# Patient Record
Sex: Female | Born: 1985 | Race: Black or African American | Hispanic: No | Marital: Single | Smoking: Never smoker
Health system: Southern US, Community
[De-identification: ages and names within clinical notes are randomized; demographics above are authoritative.]

## PROBLEM LIST (undated history)

## (undated) ENCOUNTER — Inpatient Hospital Stay (HOSPITAL_COMMUNITY): Payer: Self-pay

## (undated) DIAGNOSIS — N390 Urinary tract infection, site not specified: Secondary | ICD-10-CM

## (undated) DIAGNOSIS — I10 Essential (primary) hypertension: Secondary | ICD-10-CM

## (undated) DIAGNOSIS — K219 Gastro-esophageal reflux disease without esophagitis: Secondary | ICD-10-CM

## (undated) DIAGNOSIS — K589 Irritable bowel syndrome without diarrhea: Secondary | ICD-10-CM

## (undated) HISTORY — PX: WISDOM TOOTH EXTRACTION: SHX21

## (undated) HISTORY — DX: Gastro-esophageal reflux disease without esophagitis: K21.9

---
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
## 2007-05-06 ENCOUNTER — Emergency Department (HOSPITAL_COMMUNITY): Admission: EM | Admit: 2007-05-06 | Discharge: 2007-05-06 | Payer: Self-pay | Admitting: Emergency Medicine

## 2007-06-12 ENCOUNTER — Emergency Department (HOSPITAL_COMMUNITY): Admission: EM | Admit: 2007-06-12 | Discharge: 2007-06-12 | Payer: Self-pay | Admitting: Family Medicine

## 2007-07-14 DIAGNOSIS — R87610 Atypical squamous cells of undetermined significance on cytologic smear of cervix (ASC-US): Secondary | ICD-10-CM | POA: Insufficient documentation

## 2007-07-16 ENCOUNTER — Ambulatory Visit (HOSPITAL_COMMUNITY): Admission: RE | Admit: 2007-07-16 | Discharge: 2007-07-16 | Payer: Self-pay | Admitting: Family Medicine

## 2007-08-09 ENCOUNTER — Ambulatory Visit (HOSPITAL_COMMUNITY): Admission: RE | Admit: 2007-08-09 | Discharge: 2007-08-09 | Payer: Self-pay | Admitting: Family Medicine

## 2007-08-27 ENCOUNTER — Inpatient Hospital Stay (HOSPITAL_COMMUNITY): Admission: AD | Admit: 2007-08-27 | Discharge: 2007-08-27 | Payer: Self-pay | Admitting: Obstetrics & Gynecology

## 2007-09-08 ENCOUNTER — Ambulatory Visit (HOSPITAL_COMMUNITY): Admission: RE | Admit: 2007-09-08 | Discharge: 2007-09-08 | Payer: Self-pay | Admitting: Obstetrics & Gynecology

## 2007-09-17 ENCOUNTER — Ambulatory Visit (HOSPITAL_COMMUNITY): Admission: RE | Admit: 2007-09-17 | Discharge: 2007-09-17 | Payer: Self-pay | Admitting: Obstetrics & Gynecology

## 2007-12-04 ENCOUNTER — Inpatient Hospital Stay (HOSPITAL_COMMUNITY): Admission: AD | Admit: 2007-12-04 | Discharge: 2007-12-05 | Payer: Self-pay | Admitting: Obstetrics and Gynecology

## 2007-12-04 ENCOUNTER — Ambulatory Visit: Payer: Self-pay | Admitting: *Deleted

## 2007-12-21 ENCOUNTER — Inpatient Hospital Stay (HOSPITAL_COMMUNITY): Admission: AD | Admit: 2007-12-21 | Discharge: 2007-12-21 | Payer: Self-pay | Admitting: Obstetrics & Gynecology

## 2007-12-21 ENCOUNTER — Ambulatory Visit: Payer: Self-pay | Admitting: *Deleted

## 2008-01-25 ENCOUNTER — Inpatient Hospital Stay (HOSPITAL_COMMUNITY): Admission: AD | Admit: 2008-01-25 | Discharge: 2008-01-28 | Payer: Self-pay | Admitting: Gynecology

## 2008-01-25 ENCOUNTER — Ambulatory Visit: Payer: Self-pay | Admitting: Family

## 2008-03-09 ENCOUNTER — Encounter (INDEPENDENT_AMBULATORY_CARE_PROVIDER_SITE_OTHER): Payer: Self-pay | Admitting: Nurse Practitioner

## 2008-03-09 LAB — CONVERTED CEMR LAB
HCT: 39.6 %
Hemoglobin: 13.3 g/dL

## 2008-03-10 ENCOUNTER — Encounter (INDEPENDENT_AMBULATORY_CARE_PROVIDER_SITE_OTHER): Payer: Self-pay | Admitting: Nurse Practitioner

## 2008-03-10 LAB — CONVERTED CEMR LAB
Chlamydia, DNA Probe: NEGATIVE
GC Probe Amp, Genital: NEGATIVE
RPR Ser Ql: NONREACTIVE

## 2008-03-13 DIAGNOSIS — I10 Essential (primary) hypertension: Secondary | ICD-10-CM | POA: Insufficient documentation

## 2008-03-13 LAB — CONVERTED CEMR LAB: Pap Smear: NEGATIVE

## 2008-04-01 ENCOUNTER — Emergency Department (HOSPITAL_COMMUNITY): Admission: EM | Admit: 2008-04-01 | Discharge: 2008-04-01 | Payer: Self-pay | Admitting: Family Medicine

## 2008-04-13 ENCOUNTER — Ambulatory Visit: Payer: Self-pay | Admitting: Nurse Practitioner

## 2008-04-13 DIAGNOSIS — S86819A Strain of other muscle(s) and tendon(s) at lower leg level, unspecified leg, initial encounter: Secondary | ICD-10-CM

## 2008-04-13 DIAGNOSIS — S838X9A Sprain of other specified parts of unspecified knee, initial encounter: Secondary | ICD-10-CM | POA: Insufficient documentation

## 2008-04-19 ENCOUNTER — Encounter (INDEPENDENT_AMBULATORY_CARE_PROVIDER_SITE_OTHER): Payer: Self-pay | Admitting: Nurse Practitioner

## 2008-04-26 ENCOUNTER — Encounter (INDEPENDENT_AMBULATORY_CARE_PROVIDER_SITE_OTHER): Payer: Self-pay | Admitting: *Deleted

## 2008-04-26 ENCOUNTER — Ambulatory Visit: Payer: Self-pay | Admitting: Nurse Practitioner

## 2008-05-18 ENCOUNTER — Encounter (INDEPENDENT_AMBULATORY_CARE_PROVIDER_SITE_OTHER): Payer: Self-pay | Admitting: Nurse Practitioner

## 2008-05-19 ENCOUNTER — Encounter (INDEPENDENT_AMBULATORY_CARE_PROVIDER_SITE_OTHER): Payer: Self-pay | Admitting: Nurse Practitioner

## 2008-07-07 DIAGNOSIS — S0083XA Contusion of other part of head, initial encounter: Secondary | ICD-10-CM

## 2008-07-07 DIAGNOSIS — S0003XA Contusion of scalp, initial encounter: Secondary | ICD-10-CM | POA: Insufficient documentation

## 2008-07-07 DIAGNOSIS — S1093XA Contusion of unspecified part of neck, initial encounter: Secondary | ICD-10-CM

## 2008-07-11 ENCOUNTER — Ambulatory Visit: Payer: Self-pay | Admitting: Internal Medicine

## 2009-04-30 ENCOUNTER — Ambulatory Visit: Payer: Self-pay | Admitting: Nurse Practitioner

## 2009-04-30 DIAGNOSIS — R002 Palpitations: Secondary | ICD-10-CM | POA: Insufficient documentation

## 2009-04-30 LAB — CONVERTED CEMR LAB
ALT: <8 units/L (ref 0–35)
AST: 10 units/L (ref 0–37)
Albumin: 4.5 g/dL (ref 3.5–5.2)
Alkaline Phosphatase: 83 units/L (ref 39–117)
BUN: 9 mg/dL (ref 6–23)
Basophils Absolute: 0 10*3/uL (ref 0.0–0.1)
Basophils Relative: 0 % (ref 0–1)
Bilirubin Urine: NEGATIVE
Blood in Urine, dipstick: NEGATIVE
CO2: 26 meq/L (ref 19–32)
Calcium: 9.4 mg/dL (ref 8.4–10.5)
Chloride: 100 meq/L (ref 96–112)
Creatinine, Ser: 0.88 mg/dL (ref 0.40–1.20)
Creatinine, Urine: 316.5 mg/dL
Eosinophils Absolute: 0.1 10*3/uL (ref 0.0–0.7)
Eosinophils Relative: 1 % (ref 0–5)
Glucose, Bld: 88 mg/dL (ref 70–99)
Glucose, Urine, Semiquant: NEGATIVE
HCT: 40.1 % (ref 36.0–46.0)
Hemoglobin: 13.2 g/dL (ref 12.0–15.0)
Lymphocytes Relative: 37 % (ref 12–46)
Lymphs Abs: 2.2 10*3/uL (ref 0.7–4.0)
MCHC: 32.9 g/dL (ref 30.0–36.0)
MCV: 89.3 fL (ref 78.0–100.0)
Microalb Creat Ratio: 3.2 mg/g (ref 0.0–30.0)
Microalb, Ur: 1.02 mg/dL (ref 0.00–1.89)
Monocytes Absolute: 0.6 10*3/uL (ref 0.1–1.0)
Monocytes Relative: 10 % (ref 3–12)
Neutro Abs: 3.1 10*3/uL (ref 1.7–7.7)
Neutrophils Relative %: 52 % (ref 43–77)
Nitrite: NEGATIVE
Platelets: 450 10*3/uL — ABNORMAL HIGH (ref 150–400)
Potassium: 3.9 meq/L (ref 3.5–5.3)
RBC: 4.49 M/uL (ref 3.87–5.11)
RDW: 13.9 % (ref 11.5–15.5)
Sodium: 137 meq/L (ref 135–145)
Specific Gravity, Urine: 1.025
TSH: 1.416 microintl units/mL (ref 0.350–4.500)
Total Bilirubin: 0.4 mg/dL (ref 0.3–1.2)
Total Protein: 7.9 g/dL (ref 6.0–8.3)
Urobilinogen, UA: 0.2
WBC: 6 10*3/uL (ref 4.0–10.5)
pH: 5.5

## 2009-05-01 ENCOUNTER — Encounter (INDEPENDENT_AMBULATORY_CARE_PROVIDER_SITE_OTHER): Payer: Self-pay | Admitting: Nurse Practitioner

## 2009-08-03 ENCOUNTER — Ambulatory Visit: Payer: Self-pay | Admitting: Nurse Practitioner

## 2009-08-03 LAB — CONVERTED CEMR LAB
Bilirubin Urine: NEGATIVE
Glucose, Urine, Semiquant: NEGATIVE
Ketones, urine, test strip: NEGATIVE
Nitrite: NEGATIVE
Protein, U semiquant: NEGATIVE
Specific Gravity, Urine: 1.015
Urobilinogen, UA: 0.2
pH: 5.5

## 2010-02-05 ENCOUNTER — Emergency Department (HOSPITAL_COMMUNITY): Admission: EM | Admit: 2010-02-05 | Discharge: 2010-02-05 | Payer: Self-pay | Admitting: Family Medicine

## 2010-04-30 ENCOUNTER — Telehealth (INDEPENDENT_AMBULATORY_CARE_PROVIDER_SITE_OTHER): Payer: Self-pay | Admitting: Nurse Practitioner

## 2010-04-30 ENCOUNTER — Ambulatory Visit: Payer: Self-pay | Admitting: Nurse Practitioner

## 2010-04-30 DIAGNOSIS — N390 Urinary tract infection, site not specified: Secondary | ICD-10-CM | POA: Insufficient documentation

## 2010-04-30 LAB — CONVERTED CEMR LAB
Bilirubin Urine: NEGATIVE
Glucose, Urine, Semiquant: NEGATIVE
KOH Prep: NEGATIVE
Ketones, urine, test strip: NEGATIVE
Nitrite: NEGATIVE
Protein, U semiquant: 30
Specific Gravity, Urine: >=1.03
Urobilinogen, UA: 0.2
Whiff Test: NEGATIVE
pH: 6

## 2010-05-01 ENCOUNTER — Encounter (INDEPENDENT_AMBULATORY_CARE_PROVIDER_SITE_OTHER): Payer: Self-pay | Admitting: Internal Medicine

## 2010-05-15 ENCOUNTER — Telehealth (INDEPENDENT_AMBULATORY_CARE_PROVIDER_SITE_OTHER): Payer: Self-pay | Admitting: *Deleted

## 2010-05-18 ENCOUNTER — Emergency Department (HOSPITAL_COMMUNITY): Admission: EM | Admit: 2010-05-18 | Discharge: 2010-05-19 | Payer: Self-pay | Admitting: Emergency Medicine

## 2010-06-03 ENCOUNTER — Ambulatory Visit: Payer: Self-pay | Admitting: Physician Assistant

## 2010-06-03 DIAGNOSIS — K5289 Other specified noninfective gastroenteritis and colitis: Secondary | ICD-10-CM | POA: Insufficient documentation

## 2010-07-01 ENCOUNTER — Ambulatory Visit: Payer: Self-pay | Admitting: Nurse Practitioner

## 2010-07-01 DIAGNOSIS — N939 Abnormal uterine and vaginal bleeding, unspecified: Secondary | ICD-10-CM

## 2010-07-01 DIAGNOSIS — N926 Irregular menstruation, unspecified: Secondary | ICD-10-CM | POA: Insufficient documentation

## 2010-07-01 LAB — CONVERTED CEMR LAB
Bilirubin Urine: NEGATIVE
Blood in Urine, dipstick: NEGATIVE
Glucose, Urine, Semiquant: NEGATIVE
Nitrite: NEGATIVE
Protein, U semiquant: NEGATIVE
Rapid HIV Screen: NEGATIVE
Specific Gravity, Urine: >=1.03
Urobilinogen, UA: 0.2
pH: 5.5

## 2010-07-02 ENCOUNTER — Telehealth (INDEPENDENT_AMBULATORY_CARE_PROVIDER_SITE_OTHER): Payer: Self-pay | Admitting: Nurse Practitioner

## 2010-07-03 ENCOUNTER — Encounter (INDEPENDENT_AMBULATORY_CARE_PROVIDER_SITE_OTHER): Payer: Self-pay | Admitting: Nurse Practitioner

## 2010-07-03 LAB — CONVERTED CEMR LAB
ALT: <8 units/L (ref 0–35)
AST: 16 units/L (ref 0–37)
Albumin: 4.2 g/dL (ref 3.5–5.2)
Alkaline Phosphatase: 81 units/L (ref 39–117)
BUN: 13 mg/dL (ref 6–23)
Basophils Absolute: 0 10*3/uL (ref 0.0–0.1)
Basophils Relative: 1 % (ref 0–1)
CO2: 25 meq/L (ref 19–32)
Calcium: 9.5 mg/dL (ref 8.4–10.5)
Chloride: 108 meq/L (ref 96–112)
Creatinine, Ser: 0.92 mg/dL (ref 0.40–1.20)
Eosinophils Absolute: 0.1 10*3/uL (ref 0.0–0.7)
Eosinophils Relative: 1 % (ref 0–5)
Glucose, Bld: 86 mg/dL (ref 70–99)
HCT: 38 % (ref 36.0–46.0)
Hemoglobin: 12.9 g/dL (ref 12.0–15.0)
Lymphocytes Relative: 31 % (ref 12–46)
Lymphs Abs: 1.5 10*3/uL (ref 0.7–4.0)
MCHC: 33.9 g/dL (ref 30.0–36.0)
MCV: 90 fL (ref 78.0–100.0)
Monocytes Absolute: 0.9 10*3/uL (ref 0.1–1.0)
Monocytes Relative: 17 % — ABNORMAL HIGH (ref 3–12)
Neutro Abs: 2.5 10*3/uL (ref 1.7–7.7)
Neutrophils Relative %: 50 % (ref 43–77)
Platelets: 327 10*3/uL (ref 150–400)
Potassium: 4.5 meq/L (ref 3.5–5.3)
Preg, Serum: POSITIVE
RBC: 4.22 M/uL (ref 3.87–5.11)
RDW: 13.6 % (ref 11.5–15.5)
Sodium: 141 meq/L (ref 135–145)
TSH: 1.433 microintl units/mL (ref 0.350–4.500)
Total Bilirubin: 0.3 mg/dL (ref 0.3–1.2)
Total Protein: 7.1 g/dL (ref 6.0–8.3)
WBC: 4.9 10*3/uL (ref 4.0–10.5)

## 2010-07-04 ENCOUNTER — Encounter (INDEPENDENT_AMBULATORY_CARE_PROVIDER_SITE_OTHER): Payer: Self-pay | Admitting: Nurse Practitioner

## 2010-07-05 ENCOUNTER — Encounter (INDEPENDENT_AMBULATORY_CARE_PROVIDER_SITE_OTHER): Payer: Self-pay | Admitting: *Deleted

## 2010-07-05 ENCOUNTER — Ambulatory Visit: Payer: Self-pay | Admitting: Nurse Practitioner

## 2010-07-05 LAB — CONVERTED CEMR LAB: hCG, Beta Chain, Quant, S: 292.4 milliintl units/mL

## 2010-07-06 ENCOUNTER — Inpatient Hospital Stay (HOSPITAL_COMMUNITY): Admission: AD | Admit: 2010-07-06 | Discharge: 2010-07-06 | Payer: Self-pay | Admitting: Obstetrics & Gynecology

## 2010-07-06 ENCOUNTER — Ambulatory Visit: Payer: Self-pay | Admitting: Obstetrics and Gynecology

## 2010-07-08 ENCOUNTER — Ambulatory Visit: Payer: Self-pay | Admitting: Obstetrics and Gynecology

## 2010-07-08 ENCOUNTER — Inpatient Hospital Stay (HOSPITAL_COMMUNITY): Admission: AD | Admit: 2010-07-08 | Discharge: 2010-07-08 | Payer: Self-pay | Admitting: Obstetrics and Gynecology

## 2010-07-15 ENCOUNTER — Ambulatory Visit: Payer: Self-pay | Admitting: Nurse Practitioner

## 2010-07-15 ENCOUNTER — Ambulatory Visit (HOSPITAL_COMMUNITY): Admission: RE | Admit: 2010-07-15 | Discharge: 2010-07-15 | Payer: Self-pay | Admitting: Obstetrics and Gynecology

## 2010-09-03 ENCOUNTER — Encounter: Payer: Self-pay | Admitting: Family Medicine

## 2010-09-03 ENCOUNTER — Ambulatory Visit (HOSPITAL_COMMUNITY)
Admission: RE | Admit: 2010-09-03 | Discharge: 2010-09-03 | Payer: Self-pay | Source: Home / Self Care | Attending: Family Medicine | Admitting: Family Medicine

## 2010-09-05 ENCOUNTER — Ambulatory Visit: Payer: Self-pay | Admitting: Obstetrics & Gynecology

## 2010-09-05 ENCOUNTER — Encounter: Payer: Self-pay | Admitting: Physician Assistant

## 2010-09-19 ENCOUNTER — Ambulatory Visit: Payer: Self-pay | Admitting: Obstetrics & Gynecology

## 2010-09-20 ENCOUNTER — Inpatient Hospital Stay (HOSPITAL_COMMUNITY)
Admission: AD | Admit: 2010-09-20 | Discharge: 2010-09-20 | Payer: Self-pay | Source: Home / Self Care | Attending: Obstetrics & Gynecology | Admitting: Obstetrics & Gynecology

## 2010-09-23 ENCOUNTER — Ambulatory Visit
Admission: RE | Admit: 2010-09-23 | Discharge: 2010-09-23 | Payer: Self-pay | Source: Home / Self Care | Attending: Family Medicine | Admitting: Family Medicine

## 2010-09-24 ENCOUNTER — Encounter: Payer: Self-pay | Admitting: Family Medicine

## 2010-09-24 LAB — CONVERTED CEMR LAB
ALT: <8 units/L (ref 0–35)
Albumin: 3.9 g/dL (ref 3.5–5.2)
CO2: 22 meq/L (ref 19–32)
Calcium: 9.8 mg/dL (ref 8.4–10.5)
Chloride: 105 meq/L (ref 96–112)
Collection Interval-CRCL: 24 hr
Creatinine Clearance: 162 mL/min — ABNORMAL HIGH (ref 75–115)
Glucose, Bld: 79 mg/dL (ref 70–99)
Potassium: 3.9 meq/L (ref 3.5–5.3)
Sodium: 136 meq/L (ref 135–145)
TSH: 1.135 microintl units/mL (ref 0.350–4.500)
Total Bilirubin: 0.3 mg/dL (ref 0.3–1.2)
Total Protein: 7.1 g/dL (ref 6.0–8.3)

## 2010-09-26 ENCOUNTER — Ambulatory Visit (HOSPITAL_COMMUNITY): Admission: RE | Admit: 2010-09-26 | Payer: Self-pay | Source: Home / Self Care | Admitting: Family Medicine

## 2010-09-26 ENCOUNTER — Ambulatory Visit
Admission: RE | Admit: 2010-09-26 | Discharge: 2010-09-26 | Payer: Self-pay | Source: Home / Self Care | Attending: Obstetrics & Gynecology | Admitting: Obstetrics & Gynecology

## 2010-09-26 LAB — POCT URINALYSIS DIPSTICK
Hemoglobin, Urine: NEGATIVE
Ketones, ur: NEGATIVE mg/dL
Nitrite: NEGATIVE
Protein, ur: NEGATIVE mg/dL
Specific Gravity, Urine: >=1.03 (ref 1.005–1.030)
Urine Glucose, Fasting: NEGATIVE mg/dL
Urobilinogen, UA: 1 mg/dL (ref 0.0–1.0)
pH: 6 (ref 5.0–8.0)

## 2010-10-04 ENCOUNTER — Ambulatory Visit
Admission: RE | Admit: 2010-10-04 | Discharge: 2010-10-04 | Payer: Self-pay | Source: Home / Self Care | Attending: Obstetrics & Gynecology | Admitting: Obstetrics & Gynecology

## 2010-10-10 ENCOUNTER — Ambulatory Visit
Admission: RE | Admit: 2010-10-10 | Discharge: 2010-10-10 | Payer: Self-pay | Source: Home / Self Care | Attending: Obstetrics & Gynecology | Admitting: Obstetrics & Gynecology

## 2010-10-10 ENCOUNTER — Ambulatory Visit: Admit: 2010-10-10 | Payer: Self-pay | Admitting: Family Medicine

## 2010-10-16 ENCOUNTER — Other Ambulatory Visit (HOSPITAL_COMMUNITY): Payer: Self-pay | Admitting: Maternal and Fetal Medicine

## 2010-10-16 ENCOUNTER — Encounter: Payer: Self-pay | Admitting: Family Medicine

## 2010-10-16 ENCOUNTER — Ambulatory Visit (HOSPITAL_COMMUNITY)
Admission: RE | Admit: 2010-10-16 | Discharge: 2010-10-16 | Payer: Self-pay | Source: Home / Self Care | Attending: Family Medicine | Admitting: Family Medicine

## 2010-10-16 DIAGNOSIS — O10919 Unspecified pre-existing hypertension complicating pregnancy, unspecified trimester: Secondary | ICD-10-CM

## 2010-10-17 ENCOUNTER — Ambulatory Visit: Admit: 2010-10-17 | Payer: Self-pay | Admitting: Family Medicine

## 2010-10-18 ENCOUNTER — Ambulatory Visit
Admission: RE | Admit: 2010-10-18 | Discharge: 2010-10-18 | Payer: Self-pay | Source: Home / Self Care | Attending: Obstetrics and Gynecology | Admitting: Obstetrics and Gynecology

## 2010-10-20 LAB — CONVERTED CEMR LAB: hCG, Beta Chain, Quant, S: 25.3 milliintl units/mL

## 2010-10-22 NOTE — Progress Notes (Signed)
Summary: pt is calling to get test result  Phone Note Call from Patient Call back at [phone]   Caller: Patient Summary of Call: PT IS CALLING TO GET HER TEST RESULTS Initial call taken by: Domenic Polite,  July 02, 2010 3:12 PM  Follow-up for Phone Call        I called pt and gave her the lab results I would like her to come see me this week to discuss her options Steward Drone - call pt; offer her an appt on Friday. Get with me to see where we can fit her into the schedule but want to see her before the end of the week **Add Quantitiative HCG to blood in lab**  PT IS RETURNING YOUR CALL [phone](MARIA CASTILLO) Follow-up by: Lehman Prom FNP,  July 02, 2010 5:59 PM  Additional Follow-up for Phone Call Additional follow up Details #1::        pt is informed and will come in for ov on 10/14 Additional Follow-up by: Levon Hedger,  July 03, 2010 4:03 PM

## 2010-10-22 NOTE — Assessment & Plan Note (Signed)
Summary: Possible UTI // tl  Nurse Visit   Vital Signs:  Patient profile:   25 year old female Menstrual status:  regular Temp:     98.1 degrees F Pulse rate:   68 / minute Pulse rhythm:   regular Resp:     20 per minute BP sitting:   130 / 96  (right arm)  Vitals Entered By: Dutch Quint RN (April 30, 2010 2:09 PM)  Patient Instructions: 1)  To return in 2 weeks for BP check with Dutch Quint, RN. 2)  No concern if bp less than 140/90 \  Review of Systems GU:  Complains of dysuria, nocturia, and urinary frequency; denies hematuria and incontinence.   History of Present Illness: Pt. denies vaginal discharge, itching, odor. NKDA Uses condoms regularly   Impression & Recommendations:  Problem # 1:  DYSURIA (ICD-788.1)  Orders: T-Culture, Urine ([address]109-60454) KOH/ WET Mount [phone]) UA Dipstick w/o Micro (automated)  (81003)  Her updated medication list for this problem includes:    Bactrim Ds 800-160 Mg Tabs (Sulfamethoxazole-trimethoprim) .Marland Kitchen... 1 tab by mouth two times a day for 3 days.  Complete Medication List: 1)  Hydrochlorothiazide 25 Mg Tabs (Hydrochlorothiazide) .... Take 1 tab by mouth every morning 2)  Bactrim Ds 800-160 Mg Tabs (Sulfamethoxazole-trimethoprim) .Marland Kitchen.. 1 tab by mouth two times a day for 3 days.   Allergies: No Known Drug Allergies Laboratory Results   Urine Tests  Date/Time Received: April 30, 2010  2:00 PM  Routine Urinalysis   Color: lt. yellow Glucose: negative   (Normal Range: Negative) Bilirubin: negative   (Normal Range: Negative) Ketone: negative   (Normal Range: Negative) Spec. Gravity: >=1.030   (Normal Range: 1.003-1.035) Blood: trace-lysed   (Normal Range: Negative) pH: 6.0   (Normal Range: 5.0-8.0) Protein: 30   (Normal Range: Negative) Urobilinogen: 0.2   (Normal Range: 0-1) Nitrite: negative   (Normal Range: Negative) Leukocyte Esterace: small   (Normal Range: Negative)      Wet Mount Source: self  swab WBC/hpf: 1-5 Bacteria/hpf: 2+ Clue cells/hpf: few  Negative whiff Yeast/hpf: none Wet Mount KOH: Negative Trichomonas/hpf: none   Orders Added: 1)  T-Culture, Urine [[id number]] 2)  KOH/ WET Mount [87210] 3)  UA Dipstick w/o Micro (automated)  [81003] 4)  Est. Patient Level II [13086] Prescriptions: BACTRIM DS 800-160 MG TABS (SULFAMETHOXAZOLE-TRIMETHOPRIM) 1 tab by mouth two times a day for 3 days.  #6 x 0   Entered by:   Dutch Quint RN   Authorized by:   Lehman Prom FNP   Signed by:   Dutch Quint RN on 04/30/2010   Method used:   Electronically to        [facility] (retail)       [address]       [place], [place]  [id number]       Ph: [phone]       Fax: [phone]   RxID:   [id number]

## 2010-10-22 NOTE — Assessment & Plan Note (Signed)
Summary: Abnormal vaginal bleeding   Vital Signs:  Patient profile:   25 year old female Menstrual status:  regular Weight:      160.3 pounds Temp:     100.3 degrees F oral Pulse rate:   100 / minute Pulse rhythm:   regular Resp:     16 per minute BP sitting:   120 / 86  (right arm) Cuff size:   regular  Vitals Entered By: Michelle Nasuti (July 05, 2010 2:00 PM) CC: review lab results   Primary Care Provider:  Lehman Prom FNP  CC:  review lab results.  History of Present Illness:  Pt into the office for f/u on labs. Labs done earlier this week showed a positive serum qualititaive results. She reports that she has had some scant serous discharge since that time. Denies any vaginal cramping  Sister and best friend present here today.  Pt already has 2 children and she reports that she has a strong support system in place. Reports that the baby's father is involved.   Allergies: No Known Drug Allergies  Review of Systems CV:  Denies chest pain or discomfort. Resp:  Denies cough. GI:  Denies abdominal pain, nausea, and vomiting. GU:  Complains of abnormal vaginal bleeding; spotting since her last visit here.  Physical Exam  General:  alert.   Head:  normocephalic.   Msk:  normal ROM.   Neurologic:  alert & oriented X3.   Skin:  color normal.   Psych:  Oriented X3.     Impression & Recommendations:  Problem # 1:  ABNORMAL VAGINAL BLEEDING (ICD-626.9) still with vaginal spotting during the week. will check quants STAT and inform pt of the results low grade fever today ?concern for spontaneous miscarrage Orders: T-Pregnancy (Serum), Quant. [phone])  Problem # 2:  PREGNANCY EXAMINATION OR TEST POSITIVE RESULT (ICD-V72.42) positive serum test earlier this week will check quants  Complete Medication List: 1)  Hydrochlorothiazide 25 Mg Tabs (Hydrochlorothiazide) .... Take 1 tab by mouth every morning 2)  Prenatal/folic Acid Tabs (Prenatal vit-fe  fumarate-fa) .... One tablet by mouth daily  Patient Instructions: 1)  You will be notified of the results this afternoon. 2)  Start prenatal vitamin Prescriptions: PRENATAL/FOLIC ACID  TABS (PRENATAL VIT-FE FUMARATE-FA) One tablet by mouth daily  #30 x 11   Entered and Authorized by:   Lehman Prom FNP   Signed by:   Lehman Prom FNP on 07/05/2010   Method used:   Print then Give to Patient   RxID:   [id number]

## 2010-10-22 NOTE — Medication Information (Signed)
Summary: MAP PROGRAM  MAP PROGRAM   Imported By: Arta Bruce 07/04/2010 12:18:56  _____________________________________________________________________  External Attachment:    Type:   Image     Comment:   External Document

## 2010-10-22 NOTE — Assessment & Plan Note (Signed)
Summary: Complete Physical Exam   Vital Signs:  Patient profile:   25 year old female Menstrual status:  regular LMP:     06/08/2010 Weight:      160.4 pounds BMI:     24.84 Temp:     98.8 degrees F oral Pulse rate:   72 / minute Pulse rhythm:   regular Resp:     20 per minute BP sitting:   110 / 80  (left arm) Cuff size:   large  Vitals Entered By: Levon Hedger (July 01, 2010 3:16 PM) CC: CPP Is Patient Diabetic? No Pain Assessment Patient in pain? no       Does patient need assistance? Functional Status Self care Ambulation Normal LMP (date): 06/08/2010 LMP - Character: normal    Menses interval (days): 28 Menstrual flow (days): 3 On BCP's at conception: no Menstrual Status regular Enter LMP: 06/08/2010 Last PAP Result negative per pt done at [facility] health   Primary Care Provider:  Lehman Prom FNP  CC:  CPP.  History of Present Illness:  Pt into the office for a complete physical exam  PAP - last done 1 year ago.  All previous PAP smears normal. No family history of cervical or ovarian cancer Children: ages 62 and 2 Menses - monthly. No current birth control. Pt admits that she is sexually active.  Mammogram - never had mammogram. no family history of breast cancer  Optho - wears glasses. last eye exam was June 2011.  Dental - Last dental exam was in May 2011  tdap - up to date    Habits & Providers  Alcohol-Tobacco-Diet     Alcohol drinks/day: 0     Tobacco Status: quit     Tobacco Counseling: to remain off tobacco products     Cigarette Packs/Day: 0.25     Year Quit: 2011  Exercise-Depression-Behavior     Does Patient Exercise: no     Have you felt down or hopeless? no     Have you felt little pleasure in things? no     Depression Counseling: not indicated; screening negative for depression     Drug Use: no  Allergies (verified): No Known Drug Allergies  Review of Systems General:  Denies fever. Eyes:  Denies  blurring. ENT:  Denies earache. CV:  Denies chest pain or discomfort. Resp:  Denies cough. GI:  Denies abdominal pain, nausea, and vomiting. GU:  Denies discharge. MS:  Denies joint pain. Derm:  Denies rash. Neuro:  Denies headaches. Psych:  Denies anxiety and depression.  Physical Exam  General:  alert.   Head:  normocephalic.   Eyes:  pupils equal, pupils round, and pupils reactive to light.   Ears:  bil TM with bony landmarks present Nose:  no nasal discharge.   Mouth:  fair dentition.   Neck:  supple.   Chest Wall:  no mass.   Breasts:  skin/areolae normal.   Lungs:  normal breath sounds.   Heart:  normal rate and regular rhythm.   Abdomen:  normal bowel sounds.   Msk:  up to the exam table Extremities:  no edema Neurologic:  alert & oriented X3 and gait normal.   Skin:  color normal.   Psych:  Oriented X3.    Pelvic Exam  Vulva:      normal appearance.   Urethra and Bladder:      Urethra--normal.  Bladder--normal.   Cervix:      friable.  copious serous discharge with touching cervix  with speculum    Impression & Recommendations:  Problem # 1:  ROUTINE GYNECOLOGICAL EXAMINATION (ICD-V72.31) PAP done  labs done except cholesterol rec optho and dental exam  Problem # 2:  HYPERTENSION, BENIGN ESSENTIAL (ICD-401.1) DASH diet continue current meds Her updated medication list for this problem includes:    Hydrochlorothiazide 25 Mg Tabs (Hydrochlorothiazide) .Marland Kitchen... Take 1 tab by mouth every morning  Orders: UA Dipstick w/o Micro (manual) (16109) T-Comprehensive Metabolic Panel (60454-09811) T-CBC w/Diff (91478-29562) Rapid HIV  (13086) T-Urine Microalbumin w/creat. ratio [phone])  Problem # 3:  ABNORMAL VAGINAL BLEEDING (ICD-626.9) urine pregnancy was indeterminate will check serum pregnancy if negative, will order u/s Orders: Ultrasound (Ultrasound) T-Pregnancy (Serum), Qual.  (32440-10272) Urine Pregnancy Test  (53664)  Complete  Medication List: 1)  Hydrochlorothiazide 25 Mg Tabs (Hydrochlorothiazide) .... Take 1 tab by mouth every morning  Other Orders: T-TSH (40347-42595)  Patient Instructions: 1)  You will be notified of any abnormal lab results. 2)  Flu vaccines are available.  If you would like to get one then call this office for a nurse visit. 3)  Keep appointmeng for ultrasound 4)  Blood pressure is doing great.  Medication refills have been sent to your pharmacy 5)  Keep up your efforts to quit smoking 6)  Follow up in 3 weeks for complete physical exam 7)  You will need PAP. Prescriptions: HYDROCHLOROTHIAZIDE 25 MG  TABS (HYDROCHLOROTHIAZIDE) Take 1 tab by mouth every morning  #30 Tablet x 11   Entered and Authorized by:   Lehman Prom FNP   Signed by:   Lehman Prom FNP on 07/01/2010   Method used:   Electronically to        [facility] (retail)       [address]       [place], [place]  [id number]       Ph: [phone]       Fax: [phone]   RxID:   [id number]   Laboratory Results   Urine Tests  Date/Time Received: July 01, 2010 3:25 PM  Date/Time Reported: July 01, 2010 3:25 PM   Routine Urinalysis   Color: lt. yellow Appearance: Clear Glucose: negative   (Normal Range: Negative) Bilirubin: negative   (Normal Range: Negative) Ketone: trace (5)   (Normal Range: Negative) Spec. Gravity: >=1.030   (Normal Range: 1.003-1.035) Blood: negative   (Normal Range: Negative) pH: 5.5   (Normal Range: 5.0-8.0) Protein: negative   (Normal Range: Negative) Urobilinogen: 0.2   (Normal Range: 0-1) Nitrite: negative   (Normal Range: Negative) Leukocyte Esterace: small   (Normal Range: Negative)    Comments: urine preganacy indeterminate Date/Time Received: July 01, 2010   Other Tests  Rapid HIV: negative    Prevention & Chronic Care Immunizations   Influenza vaccine: refused  (07/01/2010)   Influenza vaccine deferral: Refused   (08/03/2009)    Tetanus booster: 09/23/2007: historical per pt    Pneumococcal vaccine: Not documented   Pneumococcal vaccine deferral: Not indicated  (08/03/2009)  Other Screening   Pap smear: negative per pt done at [facility] health  (03/13/2008)   Pap smear action/deferral: Ordered  (07/01/2010)   Pap smear due: 07/02/2011   Smoking status: quit  (07/01/2010)  Hypertension   Last Blood Pressure: 110 / 80  (07/01/2010)   Serum creatinine: 0.88  (04/30/2009)   Serum potassium 3.9  (04/30/2009) CMP ordered   Self-Management Support :   Personal Goals (by the next clinic visit) :  Personal blood pressure goal: 140/90  (07/01/2010)   Patient will work on the following items until the next clinic visit to reach self-care goals:     Medications and monitoring: check my blood pressure  (07/01/2010)    Hypertension self-management support: Not documented  Laboratory Results   Urine Tests    Routine Urinalysis   Color: lt. yellow Appearance: Clear Glucose: negative   (Normal Range: Negative) Bilirubin: negative   (Normal Range: Negative) Ketone: trace (5)   (Normal Range: Negative) Spec. Gravity: >=1.030   (Normal Range: 1.003-1.035) Blood: negative   (Normal Range: Negative) pH: 5.5   (Normal Range: 5.0-8.0) Protein: negative   (Normal Range: Negative) Urobilinogen: 0.2   (Normal Range: 0-1) Nitrite: negative   (Normal Range: Negative) Leukocyte Esterace: small   (Normal Range: Negative)    Comments: urine preganacy indeterminate   Other Tests  Rapid HIV: negative

## 2010-10-22 NOTE — Assessment & Plan Note (Signed)
Summary: Gastroenteritis   Vital Signs:  Patient profile:   25 year old female Menstrual status:  regular Height:      67.5 inches Weight:      158 pounds BMI:     24.47 Temp:     98.4 degrees F oral Pulse rate:   64 / minute Pulse rhythm:   regular Resp:     20 per minute BP sitting:   128 / 74  (left arm) Cuff size:   large  Vitals Entered By: CMA Student Dorthula Nettles CC: office visit for nausea and diarrhea onset for about a week, no OTC drugs tried, medications verified Is Patient Diabetic? No Pain Assessment Patient in pain? no       Does patient need assistance? Functional Status Self care Ambulation Normal   Primary Care Provider:  Lehman Prom FNP  CC:  office visit for nausea and diarrhea onset for about a week, no OTC drugs tried, and medications verified.  History of Present Illness: Diarrhea and nausea for one week.  No vomiting.  No fevers.  NO one else at home sick.   3 stools a day.  Stools are looser than normal.  Not pure water.  No mucus.  No blood.  No melena.   Never had this before. Notes a little cramping.   No dysuria or urinary changes. No travels.  No change in diet or new meds.   Problems Prior to Update: 1)  Urinary Tract Infection  (ICD-599.0) 2)  Palpitations  (ICD-785.1) 3)  Contusion, Head  (ICD-920) 4)  Ascus Pap  (ICD-795.01) 5)  Sprain&strain Other Specified Sites Knee&leg  (ICD-844.8) 6)  Family History Diabetes 1st Degree Relative  (ICD-V18.0) 7)  Hypertension, Benign Essential  (ICD-401.1)  Current Medications (verified): 1)  Hydrochlorothiazide 25 Mg  Tabs (Hydrochlorothiazide) .... Take 1 Tab By Mouth Every Morning 2)  Bactrim Ds 800-160 Mg Tabs (Sulfamethoxazole-Trimethoprim) .Marland Kitchen.. 1 Tab By Mouth Two Times A Day For 3 Days.  Allergies (verified): No Known Drug Allergies  Physical Exam  General:  alert, well-developed, and well-nourished.   Head:  normocephalic and atraumatic.   Eyes:  pupils equal, pupils  round, and pupils reactive to light.   Ears:  R ear normal and L ear normal.   Nose:  no external deformity.   Mouth:  pharynx pink and moist.  mucus membranes moist  Neck:  supple and no cervical lymphadenopathy.   Lungs:  normal breath sounds, no crackles, and no wheezes.   Heart:  normal rate and regular rhythm.   Abdomen:  soft, non-tender, normal bowel sounds, no distention, no masses, no guarding, and no hepatomegaly.   Neurologic:  alert & oriented X3 and cranial nerves II-XII intact.   Psych:  normally interactive.     Impression & Recommendations:  Problem # 1:  GASTROENTERITIS (ICD-558.9) probable viral clear liquids avoid caffeine advance to BRAT then reg diet f/u as needed  Complete Medication List: 1)  Hydrochlorothiazide 25 Mg Tabs (Hydrochlorothiazide) .... Take 1 tab by mouth every morning 2)  Bactrim Ds 800-160 Mg Tabs (Sulfamethoxazole-trimethoprim) .Marland Kitchen.. 1 tab by mouth two times a day for 3 days. 3)  Zantac 150 Mg Tabs (Ranitidine hcl) .... Take 1 tablet by mouth two times a day as needed for indigestion or gas  Patient Instructions: 1)  Drink clear liquids (water, gatorade, chicken broth) for 24 hours. 2)  Then, if you are ok, advance to a BRAT diet. 3)  B - bananas 4)  R -  rice 5)  A - apples  6)  T - toast 7)  Do this for 24-48 hours.  Then, slowly add your regular foods back. 8)  Try to avoid spicy foods and caffeine for several days. 9)  If needed, you can take Zantac 150 mg by mouth two times a day as needed for stomach pain or indigestion. 10)  Your symptoms should improve in the next 7-10 days.  If you are no better or feeling worse, schedule a follow up appt. Prescriptions: ZANTAC 150 MG TABS (RANITIDINE HCL) Take 1 tablet by mouth two times a day as needed for indigestion or gas  #30 x 0   Entered and Authorized by:   Tereso Newcomer PA-C   Signed by:   Tereso Newcomer PA-C on 06/03/2010   Method used:   Print then Give to Patient   RxID:    [id number]

## 2010-10-22 NOTE — Progress Notes (Signed)
Summary: Possible D/C  Phone Note Outgoing Call   Summary of Call: Ms Raven Webb, Please review chart pt is on 4th no show and pending your review pt will be discharged from practice. Initial call taken by: Hassell Halim CMA,  May 15, 2010 9:42 AM  Follow-up for Phone Call        looks like like last no show was for a lab visit - looked back and she spoke with Aggie Cosier - pt indicated that she was doing better so most likely did not return for repeat urine because symptoms had cleared and she had already been started on antibiotics. I recommend she be given 1 more chance before discharge Follow-up by: Lehman Prom FNP,  May 15, 2010 9:57 AM  Additional Follow-up for Phone Call Additional follow up Details #1::        Velna Hatchet, Please correct no show to 3rd occurance. We will give pt and additional chance. Thanks Rene Kocher Additional Follow-up by: Hassell Halim CMA,  May 15, 2010 10:21 AM    Additional Follow-up for Phone Call Additional follow up Details #2::    DONE Follow-up by: Arta Bruce,  May 15, 2010 11:21 AM

## 2010-10-22 NOTE — Progress Notes (Signed)
Summary: Possible UTI  Phone Note Call from Patient   Summary of Call: Since last two or three days, the pt has pain in her vaginal area and it unable her to go to the bathroom and the pt wants to be seen today if that ipossiblt  otthewise she needs to go to the Urgent Care. [facility] FNP Initial call taken by: Manon Hilding,  April 30, 2010 12:31 PM  Follow-up for Phone Call        Having severe pain after voiding, also difficulty in voiding, with urgency and increase in frequency.   Denies blood, no change in color.  Hard to determine if she's having abdominal pain because of the other pain.  Coming in this afternoon for triage nurse visit. Follow-up by: Dutch Quint RN,  April 30, 2010 12:35 PM  Additional Follow-up for Phone Call Additional follow up Details #1::        Pt. in office. Additional Follow-up by: Dutch Quint RN,  April 30, 2010 2:42 PM

## 2010-10-24 ENCOUNTER — Other Ambulatory Visit: Payer: Self-pay

## 2010-10-24 DIAGNOSIS — O09219 Supervision of pregnancy with history of pre-term labor, unspecified trimester: Secondary | ICD-10-CM

## 2010-10-24 DIAGNOSIS — O169 Unspecified maternal hypertension, unspecified trimester: Secondary | ICD-10-CM

## 2010-10-28 LAB — POCT URINALYSIS DIPSTICK
Bilirubin Urine: NEGATIVE
Ketones, ur: NEGATIVE mg/dL
Nitrite: NEGATIVE
Urine Glucose, Fasting: NEGATIVE mg/dL

## 2010-10-31 ENCOUNTER — Ambulatory Visit: Payer: Self-pay

## 2010-10-31 DIAGNOSIS — O09219 Supervision of pregnancy with history of pre-term labor, unspecified trimester: Secondary | ICD-10-CM

## 2010-11-07 ENCOUNTER — Other Ambulatory Visit: Payer: Self-pay

## 2010-11-07 DIAGNOSIS — O09219 Supervision of pregnancy with history of pre-term labor, unspecified trimester: Secondary | ICD-10-CM

## 2010-11-07 LAB — POCT URINALYSIS DIPSTICK
Specific Gravity, Urine: >=1.03 (ref 1.005–1.030)
Urine Glucose, Fasting: NEGATIVE mg/dL

## 2010-11-14 ENCOUNTER — Ambulatory Visit: Payer: Self-pay

## 2010-11-14 DIAGNOSIS — O09219 Supervision of pregnancy with history of pre-term labor, unspecified trimester: Secondary | ICD-10-CM

## 2010-11-21 ENCOUNTER — Ambulatory Visit: Payer: Self-pay

## 2010-11-21 DIAGNOSIS — O09219 Supervision of pregnancy with history of pre-term labor, unspecified trimester: Secondary | ICD-10-CM

## 2010-11-27 ENCOUNTER — Other Ambulatory Visit: Payer: Self-pay | Admitting: Family Medicine

## 2010-11-27 ENCOUNTER — Ambulatory Visit (HOSPITAL_COMMUNITY)
Admission: RE | Admit: 2010-11-27 | Discharge: 2010-11-27 | Disposition: A | Discharge status: Home / Self Care | Payer: Medicaid Other | Source: Ambulatory Visit | Attending: Family Medicine | Admitting: Family Medicine

## 2010-11-27 DIAGNOSIS — I10 Essential (primary) hypertension: Secondary | ICD-10-CM

## 2010-11-27 DIAGNOSIS — Z8751 Personal history of pre-term labor: Secondary | ICD-10-CM | POA: Insufficient documentation

## 2010-11-27 DIAGNOSIS — O10019 Pre-existing essential hypertension complicating pregnancy, unspecified trimester: Secondary | ICD-10-CM | POA: Insufficient documentation

## 2010-11-27 DIAGNOSIS — O10919 Unspecified pre-existing hypertension complicating pregnancy, unspecified trimester: Secondary | ICD-10-CM

## 2010-11-28 DIAGNOSIS — O09219 Supervision of pregnancy with history of pre-term labor, unspecified trimester: Secondary | ICD-10-CM

## 2010-12-02 LAB — POCT URINALYSIS DIPSTICK
Bilirubin Urine: NEGATIVE
Glucose, UA: NEGATIVE mg/dL
Glucose, UA: NEGATIVE mg/dL
Hgb urine dipstick: NEGATIVE
Ketones, ur: NEGATIVE mg/dL
Nitrite: NEGATIVE
Nitrite: POSITIVE — AB
Specific Gravity, Urine: >=1.03 (ref 1.005–1.030)
pH: 6.5 (ref 5.0–8.0)

## 2010-12-02 LAB — URINALYSIS, ROUTINE W REFLEX MICROSCOPIC
Bilirubin Urine: NEGATIVE
Nitrite: NEGATIVE
Specific Gravity, Urine: 1.025 (ref 1.005–1.030)
Urobilinogen, UA: 1 mg/dL (ref 0.0–1.0)
pH: 6.5 (ref 5.0–8.0)

## 2010-12-02 LAB — WET PREP, GENITAL: Trich, Wet Prep: NONE SEEN

## 2010-12-04 LAB — URINE MICROSCOPIC-ADD ON

## 2010-12-04 LAB — WET PREP, GENITAL: Yeast Wet Prep HPF POC: NONE SEEN

## 2010-12-04 LAB — URINE CULTURE

## 2010-12-04 LAB — CBC
HCT: 35 % — ABNORMAL LOW (ref 36.0–46.0)
Hemoglobin: 12.1 g/dL (ref 12.0–15.0)
MCH: 31.4 pg (ref 26.0–34.0)
MCHC: 34.7 g/dL (ref 30.0–36.0)
MCV: 90.5 fL (ref 78.0–100.0)
Platelets: 212 10*3/uL (ref 150–400)
RBC: 3.87 MIL/uL (ref 3.87–5.11)
RDW: 13.5 % (ref 11.5–15.5)
WBC: 4.4 10*3/uL (ref 4.0–10.5)

## 2010-12-04 LAB — RH IMMUNE GLOBULIN WORKUP (NOT WOMEN'S HOSP): Antibody Screen: NEGATIVE

## 2010-12-04 LAB — URINALYSIS, ROUTINE W REFLEX MICROSCOPIC
Bilirubin Urine: NEGATIVE
Glucose, UA: NEGATIVE mg/dL
Ketones, ur: NEGATIVE mg/dL
pH: 6 (ref 5.0–8.0)

## 2010-12-04 LAB — HCG, QUANTITATIVE, PREGNANCY: hCG, Beta Chain, Quant, S: 378 m[IU]/mL — ABNORMAL HIGH (ref <5)

## 2010-12-05 ENCOUNTER — Other Ambulatory Visit: Payer: Self-pay

## 2010-12-05 DIAGNOSIS — O169 Unspecified maternal hypertension, unspecified trimester: Secondary | ICD-10-CM

## 2010-12-05 DIAGNOSIS — O09219 Supervision of pregnancy with history of pre-term labor, unspecified trimester: Secondary | ICD-10-CM

## 2010-12-05 LAB — POCT URINALYSIS DIPSTICK
Glucose, UA: NEGATIVE mg/dL
Hgb urine dipstick: NEGATIVE
Nitrite: NEGATIVE

## 2010-12-09 LAB — POCT RAPID STREP A (OFFICE): Streptococcus, Group A Screen (Direct): NEGATIVE

## 2010-12-12 ENCOUNTER — Ambulatory Visit: Payer: Medicaid Other

## 2010-12-12 DIAGNOSIS — O09219 Supervision of pregnancy with history of pre-term labor, unspecified trimester: Secondary | ICD-10-CM

## 2010-12-19 ENCOUNTER — Encounter: Payer: Self-pay | Admitting: *Deleted

## 2010-12-19 ENCOUNTER — Other Ambulatory Visit: Payer: Self-pay | Admitting: Family Medicine

## 2010-12-19 DIAGNOSIS — O09219 Supervision of pregnancy with history of pre-term labor, unspecified trimester: Secondary | ICD-10-CM

## 2010-12-19 LAB — CONVERTED CEMR LAB
Hemoglobin: 10.6 g/dL — ABNORMAL LOW (ref 12.0–15.0)
RBC: 3.41 M/uL — ABNORMAL LOW (ref 3.87–5.11)
WBC: 6.3 10*3/uL (ref 4.0–10.5)

## 2010-12-19 LAB — POCT URINALYSIS DIP (DEVICE)
Bilirubin Urine: NEGATIVE
Glucose, UA: NEGATIVE mg/dL
Hgb urine dipstick: NEGATIVE
Nitrite: NEGATIVE

## 2010-12-26 ENCOUNTER — Ambulatory Visit: Payer: Medicaid Other

## 2010-12-26 DIAGNOSIS — O09219 Supervision of pregnancy with history of pre-term labor, unspecified trimester: Secondary | ICD-10-CM

## 2011-01-02 ENCOUNTER — Other Ambulatory Visit: Payer: Self-pay | Admitting: Obstetrics & Gynecology

## 2011-01-02 DIAGNOSIS — O169 Unspecified maternal hypertension, unspecified trimester: Secondary | ICD-10-CM

## 2011-01-02 DIAGNOSIS — O09219 Supervision of pregnancy with history of pre-term labor, unspecified trimester: Secondary | ICD-10-CM

## 2011-01-02 LAB — POCT URINALYSIS DIP (DEVICE)
Glucose, UA: NEGATIVE mg/dL
Ketones, ur: NEGATIVE mg/dL
Specific Gravity, Urine: >=1.03 (ref 1.005–1.030)

## 2011-01-06 ENCOUNTER — Inpatient Hospital Stay (HOSPITAL_COMMUNITY)
Admission: AD | Admit: 2011-01-06 | Discharge: 2011-01-08 | DRG: 782 | Disposition: A | Discharge status: Home / Self Care | Payer: Medicaid Other | Source: Ambulatory Visit | Attending: Obstetrics & Gynecology | Admitting: Obstetrics & Gynecology

## 2011-01-06 ENCOUNTER — Inpatient Hospital Stay (HOSPITAL_COMMUNITY): Payer: Medicaid Other

## 2011-01-06 DIAGNOSIS — O4700 False labor before 37 completed weeks of gestation, unspecified trimester: Secondary | ICD-10-CM | POA: Diagnosis present

## 2011-01-06 DIAGNOSIS — O4690 Antepartum hemorrhage, unspecified, unspecified trimester: Principal | ICD-10-CM | POA: Diagnosis present

## 2011-01-06 LAB — URINALYSIS, ROUTINE W REFLEX MICROSCOPIC
Bilirubin Urine: NEGATIVE
Glucose, UA: NEGATIVE mg/dL
Hgb urine dipstick: NEGATIVE
Ketones, ur: NEGATIVE mg/dL
Nitrite: NEGATIVE
Protein, ur: NEGATIVE mg/dL
Specific Gravity, Urine: 1.025 (ref 1.005–1.030)
Urobilinogen, UA: 0.2 mg/dL (ref 0.0–1.0)
pH: 6.5 (ref 5.0–8.0)

## 2011-01-06 LAB — WET PREP, GENITAL
Clue Cells Wet Prep HPF POC: NONE SEEN
Trich, Wet Prep: NONE SEEN

## 2011-01-07 LAB — GC/CHLAMYDIA PROBE AMP, GENITAL
Chlamydia, DNA Probe: NEGATIVE
GC Probe Amp, Genital: NEGATIVE

## 2011-01-09 ENCOUNTER — Ambulatory Visit: Payer: Medicaid Other

## 2011-01-09 ENCOUNTER — Inpatient Hospital Stay (HOSPITAL_COMMUNITY)
Admission: AD | Admit: 2011-01-09 | Discharge: 2011-01-09 | Disposition: A | Discharge status: Home / Self Care | Payer: Medicaid Other | Source: Ambulatory Visit | Attending: Obstetrics & Gynecology | Admitting: Obstetrics & Gynecology

## 2011-01-09 DIAGNOSIS — O09219 Supervision of pregnancy with history of pre-term labor, unspecified trimester: Secondary | ICD-10-CM

## 2011-01-09 DIAGNOSIS — O4700 False labor before 37 completed weeks of gestation, unspecified trimester: Secondary | ICD-10-CM | POA: Insufficient documentation

## 2011-01-09 DIAGNOSIS — Z331 Pregnant state, incidental: Secondary | ICD-10-CM

## 2011-01-09 LAB — URINALYSIS, ROUTINE W REFLEX MICROSCOPIC
Bilirubin Urine: NEGATIVE
Hgb urine dipstick: NEGATIVE
Ketones, ur: NEGATIVE mg/dL
Protein, ur: NEGATIVE mg/dL
Urobilinogen, UA: 1 mg/dL (ref 0.0–1.0)

## 2011-01-16 ENCOUNTER — Ambulatory Visit: Payer: Medicaid Other

## 2011-01-16 DIAGNOSIS — O09219 Supervision of pregnancy with history of pre-term labor, unspecified trimester: Secondary | ICD-10-CM

## 2011-01-16 LAB — STREP B DNA PROBE

## 2011-01-16 LAB — COMPREHENSIVE METABOLIC PANEL
ALT: 8 U/L (ref 0–35)
AST: 14 U/L (ref 0–37)
Alkaline Phosphatase: 81 U/L (ref 39–117)
Calcium: 8.8 mg/dL (ref 8.4–10.5)
GFR calc Af Amer: >60 mL/min (ref >60)
Glucose, Bld: 97 mg/dL (ref 70–99)
Potassium: 3.5 mEq/L (ref 3.5–5.1)
Sodium: 135 mEq/L (ref 135–145)
Total Protein: 5.9 g/dL — ABNORMAL LOW (ref 6.0–8.3)

## 2011-01-16 LAB — CBC
HCT: 29.2 % — ABNORMAL LOW (ref 36.0–46.0)
MCV: 89.8 fL (ref 78.0–100.0)
Platelets: 289 10*3/uL (ref 150–400)
RBC: 3.25 MIL/uL — ABNORMAL LOW (ref 3.87–5.11)
WBC: 7.2 10*3/uL (ref 4.0–10.5)

## 2011-01-22 ENCOUNTER — Ambulatory Visit (HOSPITAL_COMMUNITY)
Admission: RE | Admit: 2011-01-22 | Discharge: 2011-01-22 | Disposition: A | Discharge status: Home / Self Care | Payer: Medicaid Other | Source: Ambulatory Visit | Attending: Family Medicine | Admitting: Family Medicine

## 2011-01-22 DIAGNOSIS — Z8751 Personal history of pre-term labor: Secondary | ICD-10-CM | POA: Insufficient documentation

## 2011-01-22 DIAGNOSIS — O10019 Pre-existing essential hypertension complicating pregnancy, unspecified trimester: Secondary | ICD-10-CM | POA: Insufficient documentation

## 2011-01-22 DIAGNOSIS — I10 Essential (primary) hypertension: Secondary | ICD-10-CM

## 2011-01-23 ENCOUNTER — Other Ambulatory Visit: Payer: Self-pay | Admitting: Obstetrics and Gynecology

## 2011-01-23 DIAGNOSIS — O169 Unspecified maternal hypertension, unspecified trimester: Secondary | ICD-10-CM

## 2011-01-23 DIAGNOSIS — O09219 Supervision of pregnancy with history of pre-term labor, unspecified trimester: Secondary | ICD-10-CM

## 2011-01-23 DIAGNOSIS — Z331 Pregnant state, incidental: Secondary | ICD-10-CM

## 2011-01-23 LAB — POCT URINALYSIS DIP (DEVICE)
Nitrite: NEGATIVE
Protein, ur: NEGATIVE mg/dL
Urobilinogen, UA: 1 mg/dL (ref 0.0–1.0)
pH: 6.5 (ref 5.0–8.0)

## 2011-01-30 ENCOUNTER — Ambulatory Visit: Payer: Medicaid Other

## 2011-01-30 DIAGNOSIS — Z331 Pregnant state, incidental: Secondary | ICD-10-CM

## 2011-01-30 DIAGNOSIS — O09219 Supervision of pregnancy with history of pre-term labor, unspecified trimester: Secondary | ICD-10-CM

## 2011-01-31 NOTE — Discharge Summary (Signed)
  Raven Webb, Raven Webb                ACCOUNT NO.:  [id number]  MEDICAL RECORD NO.:  [id number]           PATIENT TYPE:  I  LOCATION:  9156                          FACILITY:  [facility]  PHYSICIAN:  Horton Chin, MD DATE OF BIRTH:  [birth year]  DATE OF ADMISSION:  01/06/2011 DATE OF DISCHARGE:  01/08/2011                              DISCHARGE SUMMARY   ADMISSION HISTORY:  Raven Webb is a 25 year old G3, P1-1-0-2 at 30 weeks and 5 days who was admitted on January 06, 2011, following an episode of vaginal bleeding and preterm contractions.  Raven Webb presented to the MAU complaining of abdominal, back, and leg pain.  She was not contracting at the time that she arrived.  Upon digital exam, she was found to have vaginal bleeding.  At that time, she was fingertip with a long cervix, and speculum exam was performed and vaginal bleeding was noted.  The source was unclear.  Immediately following the exam, the patient began having regular painful contractions and was admitted to antepartum for observation. She received a course of magnesium sulfate for neuroprophylaxis and two doses of betamethasone, one on January 06, 2011, and one on January 07, 2011.  She did not have any further vaginal bleeding.  She did have a two more episodes of contractions with no cervical change.  Her contractions were well controlled with Procardia XL 30 mg b.i.d.  GBS culture was collected upon admission.  The patient's blood type is O negative, and she had received her RhoGAM shot in the clinic prior to her admission.  PERTINENT LABORATORY DATA:  Blood type O negative, antibody screen negative, GBS pending.  Ultrasound on January 06, 2011, breech presentation.  Cervix 4.99 cm.  Ultrasound was grossly normal.  PHYSICAL EXAMINATION:  VITAL SIGNS:  Stable. ABDOMEN:  Soft and nontender.  SCE fingertip, thick, high, FM reactive,and TOCO quiet at the time of discharge.  ASSESSMENT AND PLAN:  A 25 year old gravida 3, para  1-1-0-2 at 30 weeks and 5 days with preterm labor status post an episode of vaginal bleeding discharged to home today with a prescription for Procardia XL 30 mg b.i.d.  She will follow up in the clinic as scheduled tomorrow for a 17P injection and next week for her 17P and for her regular OB visit.  She was instructed to return to MAU if she had any further vaginal bleeding, her contractions increased, or if she could have concerns about fetal movement or leaking fluid.  Otherwise, she will try to stay hydrated and rest as much as possible at home, and follow up as scheduled.    ______________________________ Georges Mouse, CNM   ______________________________ Horton Chin, MD    NF/MEDQ  D:  01/08/2011  T:  01/09/2011  Job:  [id number]  Electronically Signed by Georges Mouse CNM on 01/30/2011 01:36:56 AM Electronically Signed by Jaynie Collins MD on 01/31/2011 10:54:16 AM

## 2011-02-04 ENCOUNTER — Observation Stay (HOSPITAL_COMMUNITY)
Admission: AD | Admit: 2011-02-04 | Discharge: 2011-02-05 | Disposition: A | Discharge status: Home / Self Care | Payer: Medicaid Other | Source: Ambulatory Visit | Attending: Obstetrics & Gynecology | Admitting: Obstetrics & Gynecology

## 2011-02-04 DIAGNOSIS — O4700 False labor before 37 completed weeks of gestation, unspecified trimester: Principal | ICD-10-CM | POA: Insufficient documentation

## 2011-02-06 ENCOUNTER — Other Ambulatory Visit: Payer: Self-pay | Admitting: Obstetrics & Gynecology

## 2011-02-06 ENCOUNTER — Inpatient Hospital Stay (HOSPITAL_COMMUNITY)
Admission: AD | Admit: 2011-02-06 | Discharge: 2011-02-06 | Disposition: A | Discharge status: Home / Self Care | Payer: Medicaid Other | Source: Ambulatory Visit | Attending: Family Medicine | Admitting: Family Medicine

## 2011-02-06 DIAGNOSIS — O4700 False labor before 37 completed weeks of gestation, unspecified trimester: Secondary | ICD-10-CM | POA: Insufficient documentation

## 2011-02-06 DIAGNOSIS — O09219 Supervision of pregnancy with history of pre-term labor, unspecified trimester: Secondary | ICD-10-CM

## 2011-02-06 DIAGNOSIS — Z331 Pregnant state, incidental: Secondary | ICD-10-CM

## 2011-02-06 LAB — POCT URINALYSIS DIP (DEVICE)
Protein, ur: NEGATIVE mg/dL
Urobilinogen, UA: 0.2 mg/dL (ref 0.0–1.0)

## 2011-02-06 LAB — STREP B DNA PROBE

## 2011-02-09 ENCOUNTER — Observation Stay (HOSPITAL_COMMUNITY)
Admission: AD | Admit: 2011-02-09 | Discharge: 2011-02-09 | DRG: 778 | Disposition: A | Discharge status: Home / Self Care | Payer: Medicaid Other | Source: Ambulatory Visit | Attending: Obstetrics & Gynecology | Admitting: Obstetrics & Gynecology

## 2011-02-09 DIAGNOSIS — O4700 False labor before 37 completed weeks of gestation, unspecified trimester: Principal | ICD-10-CM | POA: Diagnosis present

## 2011-02-09 LAB — URINE MICROSCOPIC-ADD ON

## 2011-02-09 LAB — URINALYSIS, ROUTINE W REFLEX MICROSCOPIC
Bilirubin Urine: NEGATIVE
Glucose, UA: NEGATIVE mg/dL
Ketones, ur: NEGATIVE mg/dL
pH: 7 (ref 5.0–8.0)

## 2011-02-09 LAB — CBC
Hemoglobin: 10.1 g/dL — ABNORMAL LOW (ref 12.0–15.0)
MCHC: 32.6 g/dL (ref 30.0–36.0)
RDW: 13.2 % (ref 11.5–15.5)

## 2011-02-10 ENCOUNTER — Inpatient Hospital Stay (HOSPITAL_COMMUNITY)
Admission: AD | Admit: 2011-02-10 | Discharge: 2011-02-11 | DRG: 775 | Disposition: A | Discharge status: Home / Self Care | Payer: Medicaid Other | Source: Ambulatory Visit | Attending: Obstetrics & Gynecology | Admitting: Obstetrics & Gynecology

## 2011-02-10 LAB — CBC
Hemoglobin: 9.9 g/dL — ABNORMAL LOW (ref 12.0–15.0)
MCH: 28.7 pg (ref 26.0–34.0)
MCHC: 32.7 g/dL (ref 30.0–36.0)
MCV: 87.8 fL (ref 78.0–100.0)
RBC: 3.45 MIL/uL — ABNORMAL LOW (ref 3.87–5.11)

## 2011-02-10 LAB — COMPREHENSIVE METABOLIC PANEL
BUN: 4 mg/dL — ABNORMAL LOW (ref 6–23)
CO2: 17 mEq/L — ABNORMAL LOW (ref 19–32)
Calcium: 9.2 mg/dL (ref 8.4–10.5)
Chloride: 100 mEq/L (ref 96–112)
Creatinine, Ser: 0.48 mg/dL (ref 0.4–1.2)
GFR calc non Af Amer: >60 mL/min (ref >60)
Total Bilirubin: 0.3 mg/dL (ref 0.3–1.2)

## 2011-02-10 LAB — URINALYSIS, DIPSTICK ONLY
Glucose, UA: NEGATIVE mg/dL
Leukocytes, UA: NEGATIVE
Specific Gravity, Urine: 1.015 (ref 1.005–1.030)
pH: 7 (ref 5.0–8.0)

## 2011-02-10 LAB — RPR: RPR Ser Ql: NONREACTIVE

## 2011-03-12 ENCOUNTER — Ambulatory Visit: Payer: Medicaid Other | Admitting: Obstetrics and Gynecology

## 2011-03-12 DIAGNOSIS — Z3049 Encounter for surveillance of other contraceptives: Secondary | ICD-10-CM

## 2011-03-23 NOTE — Discharge Summary (Signed)
  NAMENESREEN, ALBANO NO.:  [id number]  MEDICAL RECORD NO.:  [id number]  LOCATION:  9156                          FACILITY:  [facility]  PHYSICIAN:  Georges Mouse, CNM   DATE OF BIRTH:  1986/01/13  DATE OF ADMISSION:  02/04/2011 DATE OF DISCHARGE:  02/05/2011                              DISCHARGE SUMMARY   Ms. Culverhouse is a 25 year old G3, P1-1-0-2, who was admitted for observation on Feb 04, 2011, at 34 weeks and 3 days for preterm contractions.  Upon her admission, her cervix was found to be closed, 50%, and -3 and during her stay in the MAU, her cervix progressed to 1 cm, 60%, and -3.  She was admitted to antepartum, given one dose of Procardia, IV hydration, and received GBS prophylaxis.  During her admission, her contractions continued; however, she had no cervical change.  Upon discharge, her cervix was found to be 1-2 cm, 30%, -2, and firm.  Her vital signs were normal and stable throughout her stay.  Fetal heart rate was reactive.  ASSESSMENT AND PLAN:  A 25 year old gravida 3, para 1-1-0-2 at 34 weeks and 4 days with threatened preterm labor and stable status.  She will be discharged home today.  She has an appointment in the clinic and will follow up as scheduled.  Precautions were reviewed for preterm labor. She will return immediately with an increase in her contractions, vaginal bleeding, leaking fluid, decreased fetal movement, or any other concerns.  The patient states understanding and plans to follow up in the clinic tomorrow.          ______________________________ Georges Mouse, CNM     NF/MEDQ  D:  03/01/2011  T:  03/02/2011  Job:  [id number]  Electronically Signed by Georges Mouse CNM on 03/21/2011 01:27:48 PM Electronically Signed by Georges Mouse CNM on 03/23/2011 03:08:55 AM

## 2011-05-19 ENCOUNTER — Inpatient Hospital Stay (INDEPENDENT_AMBULATORY_CARE_PROVIDER_SITE_OTHER)
Admission: RE | Admit: 2011-05-19 | Discharge: 2011-05-19 | Disposition: A | Discharge status: Home / Self Care | Payer: Medicaid Other | Source: Ambulatory Visit | Attending: Family Medicine | Admitting: Family Medicine

## 2011-05-19 DIAGNOSIS — I10 Essential (primary) hypertension: Secondary | ICD-10-CM

## 2011-05-19 DIAGNOSIS — R071 Chest pain on breathing: Secondary | ICD-10-CM

## 2011-05-28 ENCOUNTER — Ambulatory Visit (INDEPENDENT_AMBULATORY_CARE_PROVIDER_SITE_OTHER): Payer: Self-pay | Admitting: *Deleted

## 2011-05-28 VITALS — BP 126/82 | HR 84 | Temp 99.0°F

## 2011-05-28 DIAGNOSIS — IMO0001 Reserved for inherently not codable concepts without codable children: Secondary | ICD-10-CM

## 2011-05-28 DIAGNOSIS — Z3049 Encounter for surveillance of other contraceptives: Secondary | ICD-10-CM

## 2011-05-28 MED ORDER — MEDROXYPROGESTERONE ACETATE 150 MG/ML IM SUSP
150.0000 mg | Freq: Once | INTRAMUSCULAR | Status: AC
  Administered 2011-05-28: 150 mg via INTRAMUSCULAR

## 2011-06-16 LAB — URINE MICROSCOPIC-ADD ON

## 2011-06-16 LAB — URINALYSIS, ROUTINE W REFLEX MICROSCOPIC
Bilirubin Urine: NEGATIVE
Ketones, ur: NEGATIVE
Nitrite: NEGATIVE
Nitrite: NEGATIVE
Protein, ur: NEGATIVE
Specific Gravity, Urine: 1.015
Urobilinogen, UA: 0.2
pH: 7

## 2011-06-16 LAB — WET PREP, GENITAL
Clue Cells Wet Prep HPF POC: NONE SEEN
Yeast Wet Prep HPF POC: NONE SEEN

## 2011-06-30 LAB — URINALYSIS, ROUTINE W REFLEX MICROSCOPIC
Glucose, UA: NEGATIVE
pH: 6

## 2011-07-03 LAB — POCT PREGNANCY, URINE
Operator id: 235561
Preg Test, Ur: POSITIVE

## 2011-07-07 LAB — POCT PREGNANCY, URINE
Operator id: 239701
Preg Test, Ur: NEGATIVE

## 2011-08-04 ENCOUNTER — Other Ambulatory Visit: Payer: Self-pay | Admitting: Obstetrics and Gynecology

## 2011-08-13 ENCOUNTER — Ambulatory Visit: Payer: Self-pay

## 2011-08-19 ENCOUNTER — Telehealth: Payer: Self-pay | Admitting: *Deleted

## 2011-08-19 NOTE — Telephone Encounter (Signed)
Pt left message requesting refill of BP med.  She uses [facility] on [address].  I called pt and informed her that we don't typically continue to prescribe BP med after pt's delivery. She will need to see her regular doctor so that she will get proper evaluation and treatment of her BP. Since she has an appt this week for Depo Provera, she can ask the nurse to speak to the doctor in the clinic @ that time. Pt voiced understanding.

## 2011-08-20 ENCOUNTER — Inpatient Hospital Stay (HOSPITAL_COMMUNITY)
Admission: AD | Admit: 2011-08-20 | Discharge: 2011-08-21 | Disposition: A | Discharge status: Home / Self Care | Payer: Medicaid Other | Source: Ambulatory Visit | Attending: Obstetrics & Gynecology | Admitting: Obstetrics & Gynecology

## 2011-08-20 ENCOUNTER — Encounter (HOSPITAL_COMMUNITY): Payer: Self-pay | Admitting: *Deleted

## 2011-08-20 DIAGNOSIS — R519 Headache, unspecified: Secondary | ICD-10-CM

## 2011-08-20 DIAGNOSIS — R51 Headache: Secondary | ICD-10-CM | POA: Insufficient documentation

## 2011-08-20 HISTORY — DX: Urinary tract infection, site not specified: N39.0

## 2011-08-20 HISTORY — DX: Essential (primary) hypertension: I10

## 2011-08-20 LAB — URINALYSIS, ROUTINE W REFLEX MICROSCOPIC
Bilirubin Urine: NEGATIVE
Ketones, ur: 15 mg/dL — AB
Nitrite: NEGATIVE
Urobilinogen, UA: 1 mg/dL (ref 0.0–1.0)

## 2011-08-20 LAB — URINE MICROSCOPIC-ADD ON

## 2011-08-20 MED ORDER — BUTALBITAL-APAP-CAFFEINE 50-325-40 MG PO TABS
2.0000 | ORAL_TABLET | Freq: Once | ORAL | Status: AC
  Administered 2011-08-20: 2 via ORAL
  Filled 2011-08-20 (×3): qty 2

## 2011-08-20 NOTE — Progress Notes (Signed)
Pt states she has a throbbing h/a since this am. Taking Tylenol with no relief. Pt has a hx of htn has not taken her HCT in 2 weeks.

## 2011-08-20 NOTE — ED Provider Notes (Signed)
History   Pt presents today c/o HA that started this am. She states she has taken tylenol without relief. She states her head hurts in the front of her head and in the back. She does get some relief from massaging her head. She denies fever but has had a slight sore throat. She states she has not taken her HCTZ for HTN in several weeks.  Chief Complaint  Patient presents with  . Headache   HPI  OB History    Grav Para Term Preterm Abortions TAB SAB Ect Mult Living   3 3 1 2      3       Past Medical History  Diagnosis Date  . Hypertension   . UTI (lower urinary tract infection)     Past Surgical History  Procedure Date  . Wisdom tooth extraction     No family history on file.  History  Substance Use Topics  . Smoking status: Never Smoker   . Smokeless tobacco: Never Used  . Alcohol Use: No    Allergies: No Known Allergies  No prescriptions prior to admission    Review of Systems  Constitutional: Negative for fever.  HENT: Positive for sore throat. Negative for hearing loss.   Eyes: Negative for blurred vision and double vision.  Respiratory: Negative for cough, hemoptysis, sputum production, shortness of breath and wheezing.   Cardiovascular: Negative for chest pain and palpitations.  Gastrointestinal: Negative for nausea, vomiting, abdominal pain, diarrhea and constipation.  Genitourinary: Negative for dysuria, urgency, frequency and hematuria.  Neurological: Positive for headaches. Negative for dizziness.  Psychiatric/Behavioral: Negative for depression and suicidal ideas.   Physical Exam   Blood pressure 142/89, pulse 94, temperature 99.1 F (37.3 C), resp. rate 20, height 5\' 7"  (1.702 m), weight 176 lb (79.833 kg), last menstrual period 05/25/2011.  Physical Exam  Nursing note and vitals reviewed. Constitutional: She is oriented to person, place, and time. She appears well-developed and well-nourished. No distress.  HENT:  Head: Normocephalic and  atraumatic.  Eyes: EOM are normal. Pupils are equal, round, and reactive to light.  Cardiovascular: Normal rate, regular rhythm and normal heart sounds.  Exam reveals no gallop and no friction rub.   No murmur heard. Respiratory: Effort normal and breath sounds normal. No respiratory distress. She has no wheezes. She has no rales. She exhibits no tenderness.  GI: Soft. She exhibits no distension. There is no tenderness. There is no rebound and no guarding.  Neurological: She is alert and oriented to person, place, and time.  Skin: Skin is warm and dry. She is not diaphoretic.  Psychiatric: She has a normal mood and affect. Her behavior is normal. Judgment and thought content normal.    MAU Course  Procedures  Results for orders placed during the hospital encounter of 08/20/11 (from the past 24 hour(s))  URINALYSIS, ROUTINE W REFLEX MICROSCOPIC     Status: Abnormal   Collection Time   08/20/11 10:30 PM      Component Value Range   Color, Urine YELLOW  YELLOW    APPearance CLEAR  CLEAR    Specific Gravity, Urine >1.030 (*) 1.005 - 1.030    pH 6.5  5.0 - 8.0    Glucose, UA NEGATIVE  NEGATIVE (mg/dL)   Hgb urine dipstick NEGATIVE  NEGATIVE    Bilirubin Urine NEGATIVE  NEGATIVE    Ketones, ur 15 (*) NEGATIVE (mg/dL)   Protein, ur NEGATIVE  NEGATIVE (mg/dL)   Urobilinogen, UA 1.0  0.0 -  1.0 (mg/dL)   Nitrite NEGATIVE  NEGATIVE    Leukocytes, UA TRACE (*) NEGATIVE   URINE MICROSCOPIC-ADD ON     Status: Abnormal   Collection Time   08/20/11 10:30 PM      Component Value Range   Squamous Epithelial / LPF RARE  RARE    WBC, UA 3-6  <3 (WBC/hpf)   Bacteria, UA FEW (*) RARE    Urine-Other MUCOUS PRESENT      Pt sx completely resolved following IV hydration and HA cocktail. Assessment and Plan  HA: discussed with pt at length. Advised adequate hydration. She will f/u with her PCP. Discussed diet, activity, risks, and precautions.  Clinton Gallant. Rice III, DrHSc, MPAS, PA-C  08/20/2011,  11:04 PM   Henrietta Hoover, PA 08/21/11 [id number]

## 2011-08-21 ENCOUNTER — Ambulatory Visit (INDEPENDENT_AMBULATORY_CARE_PROVIDER_SITE_OTHER): Payer: Medicaid Other | Admitting: *Deleted

## 2011-08-21 VITALS — BP 130/82 | HR 78 | Temp 97.6°F

## 2011-08-21 DIAGNOSIS — Z3049 Encounter for surveillance of other contraceptives: Secondary | ICD-10-CM

## 2011-08-21 DIAGNOSIS — I10 Essential (primary) hypertension: Secondary | ICD-10-CM

## 2011-08-21 DIAGNOSIS — Z309 Encounter for contraceptive management, unspecified: Secondary | ICD-10-CM

## 2011-08-21 MED ORDER — HYDROCHLOROTHIAZIDE 25 MG PO TABS: 25.0000 mg | ORAL_TABLET | Freq: Every day | ORAL | Status: DC

## 2011-08-21 MED ORDER — METOCLOPRAMIDE HCL 5 MG/ML IJ SOLN
10.0000 mg | Freq: Once | INTRAMUSCULAR | Status: AC
  Administered 2011-08-21: 10 mg via INTRAVENOUS
  Filled 2011-08-21: qty 2

## 2011-08-21 MED ORDER — MEDROXYPROGESTERONE ACETATE 150 MG/ML IM SUSP
150.0000 mg | Freq: Once | INTRAMUSCULAR | Status: AC
  Administered 2011-08-21: 150 mg via INTRAMUSCULAR

## 2011-08-21 MED ORDER — DIPHENHYDRAMINE HCL 50 MG/ML IJ SOLN
25.0000 mg | Freq: Once | INTRAMUSCULAR | Status: AC
  Administered 2011-08-21: 25 mg via INTRAVENOUS
  Filled 2011-08-21: qty 1

## 2011-08-21 MED ORDER — DEXAMETHASONE SODIUM PHOSPHATE 10 MG/ML IJ SOLN
10.0000 mg | Freq: Once | INTRAMUSCULAR | Status: AC
  Administered 2011-08-21: 10 mg via INTRAVENOUS
  Filled 2011-08-21: qty 1

## 2011-08-21 MED ORDER — LACTATED RINGERS IV BOLUS (SEPSIS)
1000.0000 mL | Freq: Once | INTRAVENOUS | Status: AC
  Administered 2011-08-21: 1000 mL via INTRAVENOUS

## 2011-08-21 NOTE — Progress Notes (Signed)
Pt asked for a refill on her hctz 25mg  to take until she is able to get in to see a pcp. I spoke with Maylon Cos and she gave a verbal order for the hctz. Pt will get a 30 day supply and 1 refill. Pt understands that she needs to get established with a pcp for future refills.

## 2011-11-10 ENCOUNTER — Ambulatory Visit (INDEPENDENT_AMBULATORY_CARE_PROVIDER_SITE_OTHER): Payer: Medicaid Other | Admitting: *Deleted

## 2011-11-10 VITALS — BP 141/89 | HR 85 | Temp 99.0°F | Ht 67.0 in | Wt 194.9 lb

## 2011-11-10 DIAGNOSIS — Z3049 Encounter for surveillance of other contraceptives: Secondary | ICD-10-CM

## 2011-11-10 MED ORDER — MEDROXYPROGESTERONE ACETATE 150 MG/ML IM SUSP
150.0000 mg | Freq: Once | INTRAMUSCULAR | Status: AC
  Administered 2011-11-10: 150 mg via INTRAMUSCULAR

## 2011-12-15 ENCOUNTER — Inpatient Hospital Stay (HOSPITAL_COMMUNITY)
Admission: AD | Admit: 2011-12-15 | Discharge: 2011-12-15 | Disposition: A | Discharge status: Home / Self Care | Payer: Medicaid Other | Source: Ambulatory Visit | Attending: Obstetrics & Gynecology | Admitting: Obstetrics & Gynecology

## 2011-12-15 ENCOUNTER — Encounter (HOSPITAL_COMMUNITY): Payer: Self-pay

## 2011-12-15 DIAGNOSIS — R197 Diarrhea, unspecified: Secondary | ICD-10-CM | POA: Insufficient documentation

## 2011-12-15 DIAGNOSIS — R110 Nausea: Secondary | ICD-10-CM | POA: Insufficient documentation

## 2011-12-15 DIAGNOSIS — R109 Unspecified abdominal pain: Secondary | ICD-10-CM | POA: Insufficient documentation

## 2011-12-15 LAB — CBC
HCT: 40 % (ref 36.0–46.0)
Hemoglobin: 13.5 g/dL (ref 12.0–15.0)
MCH: 28.8 pg (ref 26.0–34.0)
MCHC: 33.8 g/dL (ref 30.0–36.0)
MCV: 85.3 fL (ref 78.0–100.0)
RBC: 4.69 MIL/uL (ref 3.87–5.11)

## 2011-12-15 LAB — URINALYSIS, ROUTINE W REFLEX MICROSCOPIC
Ketones, ur: NEGATIVE mg/dL
Nitrite: NEGATIVE
Protein, ur: NEGATIVE mg/dL
Urobilinogen, UA: 0.2 mg/dL (ref 0.0–1.0)
pH: 5.5 (ref 5.0–8.0)

## 2011-12-15 LAB — COMPREHENSIVE METABOLIC PANEL
BUN: 8 mg/dL (ref 6–23)
CO2: 25 mEq/L (ref 19–32)
Calcium: 10.1 mg/dL (ref 8.4–10.5)
GFR calc Af Amer: >90 mL/min (ref >90)
GFR calc non Af Amer: 89 mL/min — ABNORMAL LOW (ref >90)
Glucose, Bld: 90 mg/dL (ref 70–99)
Total Protein: 7.6 g/dL (ref 6.0–8.3)

## 2011-12-15 MED ORDER — PROMETHAZINE HCL 12.5 MG PO TABS: 12.5000 mg | ORAL_TABLET | Freq: Four times a day (QID) | ORAL | Status: AC | PRN

## 2011-12-15 MED ORDER — PANTOPRAZOLE SODIUM 40 MG PO TBEC: 40.0000 mg | DELAYED_RELEASE_TABLET | Freq: Every day | ORAL | Status: DC

## 2011-12-15 NOTE — MAU Provider Note (Signed)
Medical Screening exam and patient care preformed by advanced practice provider.  Agree with the above management.  

## 2011-12-15 NOTE — MAU Provider Note (Signed)
Raven Webb 15 @Unknown  by LMP Chief Complaint  Patient presents with  . Nausea  . Abdominal Pain    SUBJECTIVE  HPI: Presents with chronic nausea occurring every day since sometime in December. She states she gets a nauseated sensation and feeling of fullness within seconds or a minute or two after eating or drinking anything. She denies retching, vomiting or dyspepsia. She has abdominal discomfort (indicating periumbilical area) which occurs along with the nausea only. She denies any particular food intolerances.She does have change in stool pattern mostly diarrhea which occurs about every morning. She is unsure if she's lost weight but says she's eating very little. She has no gynecologic concerns and is on Depo-Provera.  Past Medical History  Diagnosis Date  . Hypertension   . UTI (lower urinary tract infection)    Past Surgical History  Procedure Date  . Wisdom tooth extraction    History   Social History  . Marital Status: Single    Spouse Name: N/A    Number of Children: N/A  . Years of Education: N/A   Occupational History  . Not on file.   Social History Main Topics  . Smoking status: Never Smoker   . Smokeless tobacco: Never Used  . Alcohol Use: No  . Drug Use: No  . Sexually Active: Not Currently    Birth Control/ Protection: Injection   Other Topics Concern  . Not on file   Social History Narrative  . No narrative on file   No current facility-administered medications on file prior to encounter.   Current Outpatient Prescriptions on File Prior to Encounter  Medication Sig Dispense Refill  . hydrochlorothiazide (HYDRODIURIL) 25 MG tablet Take 1 tablet (25 mg total) by mouth daily.  30 tablet  1   No Known Allergies  ROS: Pertinent items in HPI  OBJECTIVE Blood pressure 135/78, pulse 77, temperature 98.5 F (36.9 C), temperature source Oral, resp. rate 18, height 5\' 7"  (1.702 m), SpO2 98.00%. GENERAL: Well-developed, well-nourished  female in no acute distress.  NECK: no thyromegaly noted LUNGS: Clear to auscultation bilaterally.  HEART: Regular rate and rhythm. ABDOMEN: BS normal. Soft, nontender, nondistended. Murphy sign negative. No guarding or rebound. EXTREMITIES: Nontender, no edema    LAB RESULTS Results for orders placed during the hospital encounter of 12/15/11 (from the past 24 hour(s))  URINALYSIS, ROUTINE W REFLEX MICROSCOPIC     Status: Abnormal   Collection Time   12/15/11 11:48 AM      Component Value Range   Color, Urine YELLOW  YELLOW    APPearance CLEAR  CLEAR    Specific Gravity, Urine >1.030 (*) 1.005 - 1.030    pH 5.5  5.0 - 8.0    Glucose, UA NEGATIVE  NEGATIVE (mg/dL)   Hgb urine dipstick NEGATIVE  NEGATIVE    Bilirubin Urine NEGATIVE  NEGATIVE    Ketones, ur NEGATIVE  NEGATIVE (mg/dL)   Protein, ur NEGATIVE  NEGATIVE (mg/dL)   Urobilinogen, UA 0.2  0.0 - 1.0 (mg/dL)   Nitrite NEGATIVE  NEGATIVE    Leukocytes, UA TRACE (*) NEGATIVE   URINE MICROSCOPIC-ADD ON     Status: Abnormal   Collection Time   12/15/11 11:48 AM      Component Value Range   Squamous Epithelial / LPF FEW (*) RARE    WBC, UA 0-2  <3 (WBC/hpf)   RBC / HPF 0-2  <3 (RBC/hpf)  POCT PREGNANCY, URINE     Status: Normal   Collection Time  12/15/11 11:52 AM      Component Value Range   Preg Test, Ur NEGATIVE  NEGATIVE   CBC     Status: Normal   Collection Time   12/15/11 12:50 PM      Component Value Range   WBC 5.7  4.0 - 10.5 (K/uL)   RBC 4.69  3.87 - 5.11 (MIL/uL)   Hemoglobin 13.5  12.0 - 15.0 (g/dL)   HCT 78.2  95.6 - 21.3 (%)   MCV 85.3  78.0 - 100.0 (fL)   MCH 28.8  26.0 - 34.0 (pg)   MCHC 33.8  30.0 - 36.0 (g/dL)   RDW 08.6  57.8 - 46.9 (%)   Platelets 391  150 - 400 (K/uL)  COMPREHENSIVE METABOLIC PANEL     Status: Abnormal   Collection Time   12/15/11 12:50 PM      Component Value Range   Sodium 137  135 - 145 (mEq/L)   Potassium 4.2  3.5 - 5.1 (mEq/L)   Chloride 104  96 - 112 (mEq/L)   CO2 25   19 - 32 (mEq/L)   Glucose, Bld 90  70 - 99 (mg/dL)   BUN 8  6 - 23 (mg/dL)   Creatinine, Ser 6.29  0.50 - 1.10 (mg/dL)   Calcium 52.8  8.4 - 10.5 (mg/dL)   Total Protein 7.6  6.0 - 8.3 (g/dL)   Albumin 3.8  3.5 - 5.2 (g/dL)   AST 14  0 - 37 (U/L)   ALT 10  0 - 35 (U/L)   Alkaline Phosphatase 120 (*) 39 - 117 (U/L)   Total Bilirubin 0.3  0.3 - 1.2 (mg/dL)   GFR calc non Af Amer 89 (*) >90 (mL/min)   GFR calc Af Amer >90  >90 (mL/min)   Wt 193 (increased per pt)    ASSESSMENT Chronic nausea, ?atypical GERD  PLAN Will treat synmptomatically with Protonix and Phenergan and avoidance of spicy foods and caffeine. F/U with [facility]

## 2011-12-15 NOTE — MAU Note (Signed)
Patient is here with c/o constant nausea (at times vomiting if she finishes a full meal), left lower to mid abdominal ache and diarrhea intermittently in the morning only that has been going since December 2012. She states that she gets depo injection from the clinic and next due in may 2013. She denies any vaginal bleeding or discharge.

## 2011-12-17 ENCOUNTER — Encounter (HOSPITAL_COMMUNITY): Payer: Self-pay | Admitting: *Deleted

## 2011-12-17 ENCOUNTER — Emergency Department (HOSPITAL_COMMUNITY)
Admission: EM | Admit: 2011-12-17 | Discharge: 2011-12-17 | Disposition: A | Discharge status: Home / Self Care | Payer: Medicaid Other | Attending: Emergency Medicine | Admitting: Emergency Medicine

## 2011-12-17 ENCOUNTER — Emergency Department (HOSPITAL_COMMUNITY): Payer: Medicaid Other

## 2011-12-17 DIAGNOSIS — R197 Diarrhea, unspecified: Secondary | ICD-10-CM | POA: Insufficient documentation

## 2011-12-17 DIAGNOSIS — Z79899 Other long term (current) drug therapy: Secondary | ICD-10-CM | POA: Insufficient documentation

## 2011-12-17 DIAGNOSIS — K5900 Constipation, unspecified: Secondary | ICD-10-CM | POA: Insufficient documentation

## 2011-12-17 DIAGNOSIS — R109 Unspecified abdominal pain: Secondary | ICD-10-CM | POA: Insufficient documentation

## 2011-12-17 DIAGNOSIS — R110 Nausea: Secondary | ICD-10-CM | POA: Insufficient documentation

## 2011-12-17 DIAGNOSIS — I10 Essential (primary) hypertension: Secondary | ICD-10-CM | POA: Insufficient documentation

## 2011-12-17 LAB — URINALYSIS, ROUTINE W REFLEX MICROSCOPIC
Bilirubin Urine: NEGATIVE
Ketones, ur: NEGATIVE mg/dL
Nitrite: NEGATIVE
Specific Gravity, Urine: 1.021 (ref 1.005–1.030)
Urobilinogen, UA: 0.2 mg/dL (ref 0.0–1.0)
pH: 5.5 (ref 5.0–8.0)

## 2011-12-17 LAB — COMPREHENSIVE METABOLIC PANEL
AST: 14 U/L (ref 0–37)
BUN: 7 mg/dL (ref 6–23)
CO2: 23 mEq/L (ref 19–32)
Calcium: 9.8 mg/dL (ref 8.4–10.5)
Chloride: 105 mEq/L (ref 96–112)
Creatinine, Ser: 0.84 mg/dL (ref 0.50–1.10)
GFR calc Af Amer: >90 mL/min (ref >90)
GFR calc non Af Amer: >90 mL/min (ref >90)
Glucose, Bld: 78 mg/dL (ref 70–99)
Total Bilirubin: 0.3 mg/dL (ref 0.3–1.2)

## 2011-12-17 LAB — CBC
HCT: 38 % (ref 36.0–46.0)
Hemoglobin: 12.7 g/dL (ref 12.0–15.0)
MCV: 85 fL (ref 78.0–100.0)
RDW: 13.8 % (ref 11.5–15.5)
WBC: 5.4 10*3/uL (ref 4.0–10.5)

## 2011-12-17 LAB — URINE MICROSCOPIC-ADD ON

## 2011-12-17 LAB — DIFFERENTIAL
Eosinophils Relative: 1 % (ref 0–5)
Lymphocytes Relative: 38 % (ref 12–46)
Lymphs Abs: 2.1 10*3/uL (ref 0.7–4.0)
Monocytes Absolute: 0.4 10*3/uL (ref 0.1–1.0)
Monocytes Relative: 8 % (ref 3–12)
Neutro Abs: 2.8 10*3/uL (ref 1.7–7.7)

## 2011-12-17 LAB — LIPASE, BLOOD: Lipase: 16 U/L (ref 11–59)

## 2011-12-17 MED ORDER — DICYCLOMINE HCL 20 MG PO TABS: 20.0000 mg | ORAL_TABLET | Freq: Two times a day (BID) | ORAL | Status: DC

## 2011-12-17 MED ORDER — ONDANSETRON HCL 4 MG PO TABS: 4.0000 mg | ORAL_TABLET | Freq: Four times a day (QID) | ORAL | Status: AC

## 2011-12-17 NOTE — ED Notes (Signed)
PT is here with upper abd pain and into back for couple of months since Dec. Pt reports nausea and diarrhea.  LMP-  Depo shot  2 weeks ago.  No vaginal discharge, bleeding , or urinary s/s

## 2011-12-17 NOTE — ED Provider Notes (Signed)
History     CSN: [id number]  Arrival date & time 12/17/11  1056   First MD Initiated Contact with Patient 12/17/11 1501      Chief Complaint  Patient presents with  . Abdominal Pain  . Diarrhea    (Consider location/radiation/quality/duration/timing/severity/associated sxs/prior treatment) HPI Comments: Pt with left side abd pain off/on for 5-6 months.  Seems to be worse with eating.  Crampy.  Has nausea, but no vomiting.  +diarrhea, no blood.  Sometimes constipation.  No fevers.  No hx of bowel problems in the past.  Patient is a 26 y.o. female presenting with abdominal pain and diarrhea. The history is provided by the patient.  Abdominal Pain The primary symptoms of the illness include abdominal pain, nausea and diarrhea. The primary symptoms of the illness do not include fever, fatigue, shortness of breath or vomiting. Episode onset: 5 months ago. The onset of the illness was gradual. The problem has been gradually worsening.  Additional symptoms associated with the illness include constipation. Symptoms associated with the illness do not include chills, diaphoresis, hematuria, frequency or back pain.  Diarrhea The primary symptoms include abdominal pain, nausea and diarrhea. Primary symptoms do not include fever, fatigue, vomiting, arthralgias or rash.  The illness is also significant for constipation. The illness does not include chills or back pain.    Past Medical History  Diagnosis Date  . Hypertension   . UTI (lower urinary tract infection)     Past Surgical History  Procedure Date  . Wisdom tooth extraction     No family history on file.  History  Substance Use Topics  . Smoking status: Never Smoker   . Smokeless tobacco: Never Used  . Alcohol Use: No    OB History    Grav Para Term Preterm Abortions TAB SAB Ect Mult Living   3 3 1 2      3       Review of Systems  Constitutional: Negative for fever, chills, diaphoresis and fatigue.  HENT: Negative for  congestion, rhinorrhea and sneezing.   Eyes: Negative.   Respiratory: Negative for cough, chest tightness and shortness of breath.   Cardiovascular: Negative for chest pain and leg swelling.  Gastrointestinal: Positive for nausea, abdominal pain, diarrhea and constipation. Negative for vomiting and blood in stool.  Genitourinary: Negative for frequency, hematuria, flank pain and difficulty urinating.  Musculoskeletal: Negative for back pain and arthralgias.  Skin: Negative for rash.  Neurological: Negative for dizziness, speech difficulty, weakness, numbness and headaches.    Allergies  Review of patient's allergies indicates no known allergies.  Home Medications   Current Outpatient Rx  Name Route Sig Dispense Refill  . HYDROCHLOROTHIAZIDE 25 MG PO TABS Oral Take 1 tablet (25 mg total) by mouth daily. 30 tablet 1  . PANTOPRAZOLE SODIUM 40 MG PO TBEC Oral Take 1 tablet (40 mg total) by mouth daily. 30 tablet 1  . PROMETHAZINE HCL 12.5 MG PO TABS Oral Take 1 tablet (12.5 mg total) by mouth every 6 (six) hours as needed for nausea. 30 tablet 0  . DICYCLOMINE HCL 20 MG PO TABS Oral Take 1 tablet (20 mg total) by mouth 2 (two) times daily. 20 tablet 0  . ONDANSETRON HCL 4 MG PO TABS Oral Take 1 tablet (4 mg total) by mouth every 6 (six) hours. 12 tablet 0    BP 124/78  Pulse 77  Temp(Src) 98.1 F (36.7 C) (Oral)  Resp 16  SpO2 100%  LMP 12/03/2011  Physical  Exam  Constitutional: She is oriented to person, place, and time. She appears well-developed and well-nourished.  HENT:  Head: Normocephalic and atraumatic.  Eyes: Pupils are equal, round, and reactive to light.  Neck: Normal range of motion. Neck supple.  Cardiovascular: Normal rate, regular rhythm and normal heart sounds.   Pulmonary/Chest: Effort normal and breath sounds normal. No respiratory distress. She has no wheezes. She has no rales. She exhibits no tenderness.  Abdominal: Soft. Bowel sounds are normal. There is  tenderness. There is no rebound and no guarding.       Mild tenderness to LLQ  Musculoskeletal: Normal range of motion. She exhibits no edema.  Lymphadenopathy:    She has no cervical adenopathy.  Neurological: She is alert and oriented to person, place, and time.  Skin: Skin is warm and dry. No rash noted.  Psychiatric: She has a normal mood and affect.    ED Course  Procedures (including critical care time)  Results for orders placed during the hospital encounter of 12/17/11  URINALYSIS, ROUTINE W REFLEX MICROSCOPIC      Component Value Range   Color, Urine YELLOW  YELLOW    APPearance CLEAR  CLEAR    Specific Gravity, Urine 1.021  1.005 - 1.030    pH 5.5  5.0 - 8.0    Glucose, UA NEGATIVE  NEGATIVE (mg/dL)   Hgb urine dipstick NEGATIVE  NEGATIVE    Bilirubin Urine NEGATIVE  NEGATIVE    Ketones, ur NEGATIVE  NEGATIVE (mg/dL)   Protein, ur NEGATIVE  NEGATIVE (mg/dL)   Urobilinogen, UA 0.2  0.0 - 1.0 (mg/dL)   Nitrite NEGATIVE  NEGATIVE    Leukocytes, UA SMALL (*) NEGATIVE   POCT PREGNANCY, URINE      Component Value Range   Preg Test, Ur NEGATIVE  NEGATIVE   URINE MICROSCOPIC-ADD ON      Component Value Range   Squamous Epithelial / LPF FEW (*) RARE    WBC, UA 3-6  <3 (WBC/hpf)   RBC / HPF 0-2  <3 (RBC/hpf)   Bacteria, UA RARE  RARE    Urine-Other MUCOUS PRESENT    CBC      Component Value Range   WBC 5.4  4.0 - 10.5 (K/uL)   RBC 4.47  3.87 - 5.11 (MIL/uL)   Hemoglobin 12.7  12.0 - 15.0 (g/dL)   HCT 16.1  09.6 - 04.5 (%)   MCV 85.0  78.0 - 100.0 (fL)   MCH 28.4  26.0 - 34.0 (pg)   MCHC 33.4  30.0 - 36.0 (g/dL)   RDW 40.9  81.1 - 91.4 (%)   Platelets 377  150 - 400 (K/uL)  DIFFERENTIAL      Component Value Range   Neutrophils Relative 52  43 - 77 (%)   Neutro Abs 2.8  1.7 - 7.7 (K/uL)   Lymphocytes Relative 38  12 - 46 (%)   Lymphs Abs 2.1  0.7 - 4.0 (K/uL)   Monocytes Relative 8  3 - 12 (%)   Monocytes Absolute 0.4  0.1 - 1.0 (K/uL)   Eosinophils Relative 1   0 - 5 (%)   Eosinophils Absolute 0.1  0.0 - 0.7 (K/uL)   Basophils Relative 0  0 - 1 (%)   Basophils Absolute 0.0  0.0 - 0.1 (K/uL)  COMPREHENSIVE METABOLIC PANEL      Component Value Range   Sodium 138  135 - 145 (mEq/L)   Potassium 3.7  3.5 - 5.1 (mEq/L)  Chloride 105  96 - 112 (mEq/L)   CO2 23  19 - 32 (mEq/L)   Glucose, Bld 78  70 - 99 (mg/dL)   BUN 7  6 - 23 (mg/dL)   Creatinine, Ser 1.61  0.50 - 1.10 (mg/dL)   Calcium 9.8  8.4 - 09.6 (mg/dL)   Total Protein 7.7  6.0 - 8.3 (g/dL)   Albumin 3.9  3.5 - 5.2 (g/dL)   AST 14  0 - 37 (U/L)   ALT 9  0 - 35 (U/L)   Alkaline Phosphatase 127 (*) 39 - 117 (U/L)   Total Bilirubin 0.3  0.3 - 1.2 (mg/dL)   GFR calc non Af Amer >90  >90 (mL/min)   GFR calc Af Amer >90  >90 (mL/min)  LIPASE, BLOOD      Component Value Range   Lipase 16  11 - 59 (U/L)   Ct Abdomen Pelvis Wo Contrast  12/17/2011  *RADIOLOGY REPORT*  Clinical Data: Left flank pain and lower abdominal pain.  CT ABDOMEN AND PELVIS WITHOUT CONTRAST  Technique:  Multidetector CT imaging of the abdomen and pelvis was performed following the standard protocol without intravenous contrast.  Comparison: None.  Findings: Lung bases are clear.  Heart size normal.  No pericardial or pleural effusion.  Liver, gallbladder, adrenal glands, kidneys, spleen, pancreas and stomach are unremarkable.  A duodenal diverticulum is incidentally noted.  Bowel is otherwise unremarkable.  Uterus and ovaries are visualized.  No pathologically enlarged lymph nodes.  No free fluid.  No worrisome lytic or sclerotic lesions.  IMPRESSION: No acute findings.  No findings to explain the patient's given symptoms.  Original Report Authenticated By: Reyes Ivan, M.D.      1. Abdominal pain       MDM  Pt with intermittent left side abd pain for last 4-5 months.  Worse with eating.  No evidence of pancreatitis.  No pain over gallbladder.  No kidney stone.  No UTI.  Pt denies vag symptoms.  Pain starts LLQ,  sometimes in LUQ.  Nothing to suggest torsion.  No evidence of ovarian cyst.  Will start on GI meds, refer to GI        Rolan Bucco, MD 12/17/11 [phone]

## 2011-12-17 NOTE — Discharge Instructions (Signed)

## 2012-01-26 ENCOUNTER — Ambulatory Visit: Payer: Medicaid Other

## 2012-01-28 ENCOUNTER — Ambulatory Visit (INDEPENDENT_AMBULATORY_CARE_PROVIDER_SITE_OTHER): Payer: Medicaid Other | Admitting: *Deleted

## 2012-01-28 VITALS — BP 134/81 | HR 86

## 2012-01-28 DIAGNOSIS — Z3049 Encounter for surveillance of other contraceptives: Secondary | ICD-10-CM

## 2012-01-28 MED ORDER — MEDROXYPROGESTERONE ACETATE 150 MG/ML IM SUSP
150.0000 mg | Freq: Once | INTRAMUSCULAR | Status: AC
  Administered 2012-01-28: 150 mg via INTRAMUSCULAR

## 2012-04-14 ENCOUNTER — Ambulatory Visit: Payer: Medicaid Other

## 2012-04-19 ENCOUNTER — Ambulatory Visit: Payer: Medicaid Other

## 2012-04-20 ENCOUNTER — Ambulatory Visit (INDEPENDENT_AMBULATORY_CARE_PROVIDER_SITE_OTHER): Payer: Medicaid Other | Admitting: Medical

## 2012-04-20 VITALS — BP 128/81 | HR 82 | Resp 12

## 2012-04-20 DIAGNOSIS — Z309 Encounter for contraceptive management, unspecified: Secondary | ICD-10-CM

## 2012-04-20 DIAGNOSIS — Z3049 Encounter for surveillance of other contraceptives: Secondary | ICD-10-CM

## 2012-04-20 MED ORDER — MEDROXYPROGESTERONE ACETATE 150 MG/ML IM SUSP
150.0000 mg | INTRAMUSCULAR | Status: AC
  Administered 2012-04-20: 150 mg via INTRAMUSCULAR

## 2012-07-06 ENCOUNTER — Ambulatory Visit (INDEPENDENT_AMBULATORY_CARE_PROVIDER_SITE_OTHER): Payer: Medicaid Other

## 2012-07-06 VITALS — BP 144/87 | HR 68 | Wt 185.0 lb

## 2012-07-06 DIAGNOSIS — Z3049 Encounter for surveillance of other contraceptives: Secondary | ICD-10-CM

## 2012-07-06 MED ORDER — MEDROXYPROGESTERONE ACETATE 150 MG/ML IM SUSP
150.0000 mg | Freq: Once | INTRAMUSCULAR | Status: AC
  Administered 2012-07-06: 150 mg via INTRAMUSCULAR

## 2012-09-16 ENCOUNTER — Other Ambulatory Visit: Payer: Self-pay | Admitting: Physician Assistant

## 2012-09-23 ENCOUNTER — Ambulatory Visit: Payer: Medicaid Other

## 2013-01-25 ENCOUNTER — Encounter: Payer: Self-pay | Admitting: Internal Medicine

## 2013-01-25 ENCOUNTER — Ambulatory Visit (INDEPENDENT_AMBULATORY_CARE_PROVIDER_SITE_OTHER): Payer: Medicaid Other | Admitting: Internal Medicine

## 2013-01-25 VITALS — BP 139/89 | HR 68 | Temp 98.4°F | Ht 68.5 in | Wt 180.1 lb

## 2013-01-25 DIAGNOSIS — Z0000 Encounter for general adult medical examination without abnormal findings: Secondary | ICD-10-CM | POA: Insufficient documentation

## 2013-01-25 DIAGNOSIS — I10 Essential (primary) hypertension: Secondary | ICD-10-CM

## 2013-01-25 MED ORDER — HYDROCHLOROTHIAZIDE 12.5 MG PO TABS: 12.5000 mg | ORAL_TABLET | Freq: Every day | ORAL | Status: DC

## 2013-01-25 NOTE — Progress Notes (Signed)
Patient ID: Raven Webb, female   DOB: 1986-01-20, 27 y.o.   MRN: [id number] Internal Medicine Clinic Visit    HPI:  Raven Webb is a 26 y.o. year old female with a history of hypertension, childbirth x3, who presents to the [facility] to establish care. She is a previous [facility] patient and she was also being followed at [facility] for routine pregnancy care.  Patient states that she is doing well and has no major complaints. She has a history of hypertension (diagnosed in 2009) previously treated with HCTZ, however, she has been out of this medication for about 6 months. She denies any headache, chest pain, shortness of breath.  Patient's last Pap smear was done at [facility] after the birth of her child less than a year ago. She states that it was normal, per the patient. She does not remember the name of her physician and we do not have records in system. Patient was previously on Depo-Provera for birth control, however, she did not like the way she felt on this and stopped taking it. Currently using condoms.   Review of systems positive for chronic occasional nausea after meals, associated with diarrhea and crampiness. No constipation.  Has tried protonix and phenergan, which did not seem to help.  She denies any fever, chills, shortness of breath, edema, vaginal discharge, joint pains.  Social history Patient has 3 children ages 75, 35, 19 years old. She denies any history of smoking, alcohol use, or other drug use. Denies IV drug use, denies high-risk sexual behavior. She does not work at this time.  Family history Hypertension(dad, mom, gma, sister, uncle), DM (sister with type 1, mom type 2), renal disease (uncle). No strokes, heart attacks, cancer that she knows of.   Past Medical History  Diagnosis Date  . Hypertension   . UTI (lower urinary tract infection)     Past Surgical History  Procedure Laterality Date  . Wisdom tooth extraction       ROS:  A complete review of  systems was otherwise negative, except as noted in the HPI.  Allergies: Review of patient's allergies indicates no known allergies.  Medications: Current Outpatient Prescriptions  Medication Sig Dispense Refill  . hydrochlorothiazide (HYDRODIURIL) 25 MG tablet Take 1 tablet (25 mg total) by mouth daily.  30 tablet  1   Current Facility-Administered Medications  Medication Dose Route Frequency Provider Last Rate Last Dose  . medroxyPROGESTERone (DEPO-PROVERA) injection 150 mg  150 mg Intramuscular Q90 days Adam Phenix, MD   150 mg at 04/20/12 6578    History   Social History  . Marital Status: Single    Spouse Name: N/A    Number of Children: N/A  . Years of Education: N/A   Occupational History  . Not on file.   Social History Main Topics  . Smoking status: Never Smoker   . Smokeless tobacco: Never Used  . Alcohol Use: No  . Drug Use: No  . Sexually Active: Not Currently    Birth Control/ Protection: Injection   Other Topics Concern  . Not on file   Social History Narrative  . No narrative on file    family history is not on file.  Physical Exam Blood pressure 139/89, pulse 68, temperature 98.4 F (36.9 C), temperature source Oral, height 5' 8.5" (1.74 m), weight 180 lb 1.6 oz (81.693 kg), SpO2 100.00%. General:  No acute distress, alert and oriented x 3, well-appearing AAF HEENT:  PERRL, EOMI, no  lymphadenopathy, moist mucous membranes Cardiovascular:  Regular rate and rhythm, no murmurs Respiratory:  Clear to auscultation bilaterally, no wheezes, rales, or rhonchi, good air movement Abdomen:  Soft, nondistended, slightly tender in RLG and epigastrum, no rebound or guarding, normoactive bowel sounds Extremities:  Warm and well-perfused, no clubbing, cyanosis, or edema.  Skin: Warm, dry, no rashes, several tattoos noted Neuro: Not anxious appearing, no depressed mood, normal affect  Labs: Lab Results  Component Value Date   CREATININE 0.84 12/17/2011   BUN  7 12/17/2011   NA 138 12/17/2011   K 3.7 12/17/2011   CL 105 12/17/2011   CO2 23 12/17/2011   Lab Results  Component Value Date   WBC 5.4 12/17/2011   HGB 12.7 12/17/2011   HCT 38.0 12/17/2011   MCV 85.0 12/17/2011   PLT 377 12/17/2011      Assessment and Plan:    FOLLOWUP: Raven Webb will follow back up in our clinic in approximately  4-6 weeks. Raven Webb knows to call out clinic in the meantime with any questions or new issues.

## 2013-01-25 NOTE — Assessment & Plan Note (Signed)
Patient's last Pap smear was less than a year ago, reported normal by patient. She is not remember the name of the physician.

## 2013-01-25 NOTE — Assessment & Plan Note (Addendum)
BP Readings from Last 3 Encounters:  01/25/13 139/89  07/06/12 144/87  04/20/12 128/81    Lab Results  Component Value Date   NA 138 12/17/2011   K 3.7 12/17/2011   CREATININE 0.84 12/17/2011    Assessment: Blood pressure control: mildly elevated Progress toward BP goal:  unable to assess Comments:   Plan: Medications:  see below Educational resources provided: brochure;video Self management tools provided: home blood pressure logbook Other plans:   -Restart HCTZ 12.5 mg daily -return to clinic in 4-6 weeks for BP check and BMP

## 2013-01-25 NOTE — Patient Instructions (Signed)
Thank you for coming in today!  Please pick up your medication (HCTZ) from your pharmacy, [facility].   You will need to return to clinic in 4-6 weeks for blood work and a blood pressure check. Please schedule an appointment at the front desk before you leave today.

## 2013-01-26 NOTE — Progress Notes (Signed)
Case discussed with Dr. Kesty at the time of the visit.  We reviewed the resident's history and exam and pertinent patient test results.  I agree with the assessment, diagnosis and plan of care documented in the resident's note. 

## 2013-02-24 ENCOUNTER — Ambulatory Visit (INDEPENDENT_AMBULATORY_CARE_PROVIDER_SITE_OTHER): Payer: Medicaid Other | Admitting: Internal Medicine

## 2013-02-24 ENCOUNTER — Other Ambulatory Visit (HOSPITAL_COMMUNITY)
Admission: RE | Admit: 2013-02-24 | Discharge: 2013-02-24 | Disposition: A | Discharge status: Home / Self Care | Payer: Medicaid Other | Source: Ambulatory Visit | Attending: Internal Medicine | Admitting: Internal Medicine

## 2013-02-24 ENCOUNTER — Encounter: Payer: Self-pay | Admitting: Internal Medicine

## 2013-02-24 VITALS — BP 127/79 | HR 73 | Temp 97.7°F | Ht 67.0 in | Wt 182.4 lb

## 2013-02-24 DIAGNOSIS — R110 Nausea: Secondary | ICD-10-CM

## 2013-02-24 DIAGNOSIS — R197 Diarrhea, unspecified: Secondary | ICD-10-CM

## 2013-02-24 DIAGNOSIS — Z113 Encounter for screening for infections with a predominantly sexual mode of transmission: Secondary | ICD-10-CM | POA: Insufficient documentation

## 2013-02-24 DIAGNOSIS — Z87898 Personal history of other specified conditions: Secondary | ICD-10-CM

## 2013-02-24 DIAGNOSIS — Z8742 Personal history of other diseases of the female genital tract: Secondary | ICD-10-CM

## 2013-02-24 DIAGNOSIS — R109 Unspecified abdominal pain: Secondary | ICD-10-CM

## 2013-02-24 DIAGNOSIS — I10 Essential (primary) hypertension: Secondary | ICD-10-CM

## 2013-02-24 NOTE — Assessment & Plan Note (Signed)
Referred to OBGYN for follow up not followed in 2 years since diagnosis

## 2013-02-24 NOTE — Assessment & Plan Note (Signed)
Patient has intermittent diarrhea has 3 kids but are not sick currently  Will check CMET  Etiology unclear. Not had an episode since Tuesday prior to visit

## 2013-02-24 NOTE — Patient Instructions (Addendum)
General Instructions: Read all information below  Continue current blood pressure medication. Blood pressure looks good Return if diarrhea, abdominal pain, nausea not resolving in 1 month otherwise f/u with your regular doctor in 6 months  Follow up with OB/GYN for abnormal pap smear   Treatment Goals:  Goals (1 Years of Data) as of 02/24/13         As of Today 01/25/13 07/06/12 04/20/12 01/28/12     Blood Pressure    . Blood Pressure < 140/90  127/79 139/89 144/87 128/81 134/81      Progress Toward Treatment Goals:  Treatment Goal 02/24/2013  Blood pressure at goal    Self Care Goals & Plans:  Self Care Goal 02/24/2013  Manage my medications take my medicines as prescribed; bring my medications to every visit; refill my medications on time  Monitor my health keep track of my blood pressure  Eat healthy foods drink diet soda or water instead of juice or soda; eat more vegetables; eat foods that are low in salt; eat baked foods instead of fried foods; eat fruit for snacks and desserts; eat smaller portions  Be physically active find an activity I enjoy  Meeting treatment goals maintain the current self-care plan       Care Management & Community Referrals:  Referral 02/24/2013  Referrals made for care management support none needed  Referrals made to community resources none       Hypertension As your heart beats, it forces blood through your arteries. This force is your blood pressure. If the pressure is too high, it is called hypertension (HTN) or high blood pressure. HTN is dangerous because you may have it and not know it. High blood pressure may mean that your heart has to work harder to pump blood. Your arteries may be narrow or stiff. The extra work puts you at risk for heart disease, stroke, and other problems.  Blood pressure consists of two numbers, a higher number over a lower, 110/72, for example. It is stated as "110 over 72." The ideal is below 120 for the top number  (systolic) and under 80 for the bottom (diastolic). Write down your blood pressure today. You should pay close attention to your blood pressure if you have certain conditions such as:  Heart failure.  Prior heart attack.  Diabetes  Chronic kidney disease.  Prior stroke.  Multiple risk factors for heart disease. To see if you have HTN, your blood pressure should be measured while you are seated with your arm held at the level of the heart. It should be measured at least twice. A one-time elevated blood pressure reading (especially in the Emergency Department) does not mean that you need treatment. There may be conditions in which the blood pressure is different between your right and left arms. It is important to see your caregiver soon for a recheck. Most people have essential hypertension which means that there is not a specific cause. This type of high blood pressure may be lowered by changing lifestyle factors such as:  Stress.  Smoking.  Lack of exercise.  Excessive weight.  Drug/tobacco/alcohol use.  Eating less salt. Most people do not have symptoms from high blood pressure until it has caused damage to the body. Effective treatment can often prevent, delay or reduce that damage. TREATMENT  When a cause has been identified, treatment for high blood pressure is directed at the cause. There are a large number of medications to treat HTN. These fall into several  categories, and your caregiver will help you select the medicines that are best for you. Medications may have side effects. You should review side effects with your caregiver. If your blood pressure stays high after you have made lifestyle changes or started on medicines,   Your medication(s) may need to be changed.  Other problems may need to be addressed.  Be certain you understand your prescriptions, and know how and when to take your medicine.  Be sure to follow up with your caregiver within the time frame  advised (usually within two weeks) to have your blood pressure rechecked and to review your medications.  If you are taking more than one medicine to lower your blood pressure, make sure you know how and at what times they should be taken. Taking two medicines at the same time can result in blood pressure that is too low. SEEK IMMEDIATE MEDICAL CARE IF:  You develop a severe headache, blurred or changing vision, or confusion.  You have unusual weakness or numbness, or a faint feeling.  You have severe chest or abdominal pain, vomiting, or breathing problems. MAKE SURE YOU:   Understand these instructions.  Will watch your condition.  Will get help right away if you are not doing well or get worse. Document Released: 09/08/2005 Document Revised: 12/01/2011 Document Reviewed: 04/28/2008 [facility] Patient Information 2014 [place], [place].  Nausea, Adult Nausea is the feeling that you have an upset stomach or have to vomit. Nausea by itself is not likely a serious concern, but it may be an early sign of more serious medical problems. As nausea gets worse, it can lead to vomiting. If vomiting develops, there is the risk of dehydration.  CAUSES   Viral infections.  Food poisoning.  Medicines.  Pregnancy.  Motion sickness.  Migraine headaches.  Emotional distress.  Severe pain from any source.  Alcohol intoxication. HOME CARE INSTRUCTIONS  Get plenty of rest.  Ask your caregiver about specific rehydration instructions.  Eat small amounts of food and sip liquids more often.  Take all medicines as told by your caregiver. SEEK MEDICAL CARE IF:  You have not improved after 2 days, or you get worse.  You have a headache. SEEK IMMEDIATE MEDICAL CARE IF:   You have a fever.  You faint.  You keep vomiting or have blood in your vomit.  You are extremely weak or dehydrated.  You have dark or bloody stools.  You have severe chest or abdominal pain. MAKE SURE  YOU:  Understand these instructions.  Will watch your condition.  Will get help right away if you are not doing well or get worse. Document Released: 10/16/2004 Document Revised: 06/02/2012 Document Reviewed: 05/21/2011 [facility] Patient Information 2014 [place], [place].  Diarrhea Diarrhea is frequent loose and watery bowel movements. It can cause you to feel weak and dehydrated. Dehydration can cause you to become tired and thirsty, have a dry mouth, and have decreased urination that often is dark yellow. Diarrhea is a sign of another problem, most often an infection that will not last long. In most cases, diarrhea typically lasts 2 3 days. However, it can last longer if it is a sign of something more serious. It is important to treat your diarrhea as directed by your caregive to lessen or prevent future episodes of diarrhea. CAUSES  Some common causes include:  Gastrointestinal infections caused by viruses, bacteria, or parasites.  Food poisoning or food allergies.  Certain medicines, such as antibiotics, chemotherapy, and laxatives.  Artificial sweeteners and  fructose.  Digestive disorders. HOME CARE INSTRUCTIONS  Ensure adequate fluid intake (hydration): have 1 cup (8 oz) of fluid for each diarrhea episode. Avoid fluids that contain simple sugars or sports drinks, fruit juices, whole milk products, and sodas. Your urine should be clear or pale yellow if you are drinking enough fluids. Hydrate with an oral rehydration solution that you can purchase at pharmacies, retail stores, and online. You can prepare an oral rehydration solution at home by mixing the following ingredients together:    tsp table salt.   tsp baking soda.   tsp salt substitute containing potassium chloride.  1  tablespoons sugar.  1 L (34 oz) of water.  Certain foods and beverages may increase the speed at which food moves through the gastrointestinal (GI) tract. These foods and beverages should be avoided  and include:  Caffeinated and alcoholic beverages.  High-fiber foods, such as raw fruits and vegetables, nuts, seeds, and whole grain breads and cereals.  Foods and beverages sweetened with sugar alcohols, such as xylitol, sorbitol, and mannitol.  Some foods may be well tolerated and may help thicken stool including:  Starchy foods, such as rice, toast, pasta, low-sugar cereal, oatmeal, grits, baked potatoes, crackers, and bagels.  Bananas.  Applesauce.  Add probiotic-rich foods to help increase healthy bacteria in the GI tract, such as yogurt and fermented milk products.  Wash your hands well after each diarrhea episode.  Only take over-the-counter or prescription medicines as directed by your caregiver.  Take a warm bath to relieve any burning or pain from frequent diarrhea episodes. SEEK IMMEDIATE MEDICAL CARE IF:   You are unable to keep fluids down.  You have persistent vomiting.  You have blood in your stool, or your stools are black and tarry.  You do not urinate in 6 8 hours, or there is only a small amount of very dark urine.  You have abdominal pain that increases or localizes.  You have weakness, dizziness, confusion, or lightheadedness.  You have a severe headache.  Your diarrhea gets worse or does not get better.  You have a fever or persistent symptoms for more than 2 3 days.  You have a fever and your symptoms suddenly get worse. MAKE SURE YOU:   Understand these instructions.  Will watch your condition.  Will get help right away if you are not doing well or get worse. Document Released: 08/29/2002 Document Revised: 08/25/2012 Document Reviewed: 05/16/2012 [facility] Patient Information 2014 [place], [place].   Abnormal Pap Test Information During a Pap test, the cells on the surface of your cervix are checked to see if they look normal, abnormal, or if they show signs of having been altered by a certain type of virus called human papillomavirus, or  HPV. Cervical cells that have been affected by HPV are called dysplasia. Dysplasia is not cancer, but describes abnormal cells found on the surface of the cervix. Depending on the degree of dysplasia, some of the cells may be considered pre-cancerous and may turn into cancer over time if follow up with a caregiver is delayed.  WHAT DOES AN ABNORMAL PAP TEST MEAN? Having an abnormal pap test does not mean that you have cancer. However, certain types of abnormal pap tests can be a sign that a person is at a higher risk of developing cancer. Your caregiver will want to do other tests to find out more about the abnormal cells. Your abnormal Pap test results could show:   Small and uncertain changes that should  be carefully watched.   Cervical dysplasia that has caused mild changes and can be followed over time.  Cervical dysplasia that is more severe and needs to be followed and treated to ensure the problem goes away.  Cancer.  When severe cervical dysplasia is found and treated early, it rarely will grow into cancer.  WHAT WILL BE DONE ABOUT MY ABNORMAL PAP TEST?  A colposcopy may be needed. This is a procedure where your cervix is examined using light and magnification.  A small tissue sample of your cervix (biopsy) may need to be removed and then examined. This is often performed if there are areas that appear infected.  A sample of cells from the cervical canal may be removed with either a small brush or scraping instrument (curette). Based on the results of the procedures above, some caregivers may recommend either cryotherapy of the cervix or a surgical LEEP where a portion of the cervix is removed. LEEP is short for "loop electrical excisional procedure." Rarely, a caregiver may recommend a cone biopsy.This is a procedure where a small, cone-shaped sample of your cervix is taken out. The part that is taken out is the area where the abnormal cells are.  WHAT IF I HAVE A DYSPLASIA OR A  CANCER? You may be referred to a specialist. Radiation may also be a treatment for more advanced cancer. Having a hysterectomy is the last treatment option for dysplasia, but it is a more common treatment for someone with cancer. All treatment options will be discussed with you by your caregiver. WHAT SHOULD YOU DO AFTER BEING TREATED? If you have had an abnormal pap test, you should continue to have regular pap tests and check-ups as directed by your caregiver. Your cervical problem will be carefully watched so it does not get worse. Also, your caregiver can watch for, and treat, any new problems that may come up. Document Released: 12/24/2010 Document Revised: 12/01/2011 Document Reviewed: 09/04/2011 [facility] Patient Information 2014 [place], [place].

## 2013-02-24 NOTE — Assessment & Plan Note (Signed)
BP Readings from Last 3 Encounters:  02/24/13 127/79  01/25/13 139/89  07/06/12 144/87    Lab Results  Component Value Date   NA 138 12/17/2011   K 3.7 12/17/2011   CREATININE 0.84 12/17/2011    Assessment: Blood pressure control: controlled Progress toward BP goal:  at goal Comments: controlled   Plan: Medications:  continue current medications Educational resources provided: handout Self management tools provided: instructions for home blood pressure monitoring;other (see comments) (info about HTN) Other plans: will check BMET today

## 2013-02-24 NOTE — Assessment & Plan Note (Signed)
Unclear etiology  Will check UA to r/o UTI, CMET

## 2013-02-24 NOTE — Progress Notes (Signed)
  Subjective:    Patient ID: Raven Webb, female    DOB: 07/29/1986, 27 y.o.   MRN: [id number]  HPI Comments: 27 y.o PMH HTN recently resumed on HCTZ 12.5 mg daily here for HTN follow up and BMET check. Reports medication compliance with HCTZ 12.5 daily and tolerating   She also mentions she was nauseated last night and has been nauseated since before January 2014.  This is the reason she stopped Depo in Jan. Due to thinking nausea-->decreased oral intake which she thought was due to Depo but it has persisted.  Nausea is not better or worse since discontinuing Depo.  Nothing makes nausea better or worse   She also complains of cramping sensation under bilateral breasts x 2 months which last happened last night but is no longer present.  She had associated shortness of breath and had to take deep breaths which helped and she laid on her left side and the cramping sensation lasted 15 minutes.  She gets this sensation about 2x per week and is unsure if it is related to indigestion, denies acid reflux.  Nothing makes sensation wose.    She is interested in oral contraceptive but has not followed with OB/GYN and has a h/o abnormal pap and has not followed with OB/GYN in 2 years since noted   Other ROS+ see ROS  SH: denies sexual activity x 2 years.  She has 3 kids 2 boys and 1 girl.  Denies alcohol, drugs, tobacco       Review of Systems  Gastrointestinal: Positive for abdominal pain and diarrhea. Negative for constipation.       Lower Ab pain b/l noted since before Jan 2014.  Not worsened by menses    Intermittent diarrhea almost daily yellow to brown color once a day.  Not had since Tues. Prior to appt. Diarrhea noted x 1 year.       Genitourinary: Positive for vaginal discharge and menstrual problem. Negative for dysuria.       Clear mucus vaginal discharge denies odor  Irregular menses since Depo initiation and stopping    Neurological: Negative for headaches.       Objective:   Physical Exam  Nursing note and vitals reviewed. Constitutional: She is oriented to person, place, and time. Vital signs are normal. She appears well-developed and well-nourished. She is cooperative. No distress.  HENT:  Head: Normocephalic and atraumatic.  Mouth/Throat: Oropharynx is clear and moist and mucous membranes are normal. No oropharyngeal exudate.  Eyes: Conjunctivae are normal. Pupils are equal, round, and reactive to light. Right eye exhibits no discharge. Left eye exhibits no discharge. No scleral icterus.  Cardiovascular: Normal rate, regular rhythm, S1 normal, S2 normal and normal heart sounds.   No murmur heard. Pulmonary/Chest: Effort normal and breath sounds normal. No respiratory distress. She has no wheezes.  Abdominal: Soft. Bowel sounds are normal. There is tenderness.    Neurological: She is alert and oriented to person, place, and time. She has normal strength. Gait normal.  Neurologically intact   Skin: Skin is warm, dry and intact. No rash noted. She is not diaphoretic.  Psychiatric: She has a normal mood and affect. Her speech is normal and behavior is normal. Judgment and thought content normal. Cognition and memory are normal.          Assessment & Plan:

## 2013-02-24 NOTE — Assessment & Plan Note (Signed)
Unclear etiology  Appears chronic  Will check urine pregnancy, CMET

## 2013-02-25 ENCOUNTER — Encounter: Payer: Self-pay | Admitting: Internal Medicine

## 2013-02-25 LAB — URINALYSIS, ROUTINE W REFLEX MICROSCOPIC
Glucose, UA: NEGATIVE mg/dL
Leukocytes, UA: NEGATIVE
Protein, ur: 30 mg/dL — AB
pH: 5.5 (ref 5.0–8.0)

## 2013-02-25 LAB — URINALYSIS, MICROSCOPIC ONLY

## 2013-02-25 LAB — COMPREHENSIVE METABOLIC PANEL
CO2: 27 mEq/L (ref 19–32)
Creat: 1.03 mg/dL (ref 0.50–1.10)
Glucose, Bld: 73 mg/dL (ref 70–99)
Total Bilirubin: 0.4 mg/dL (ref 0.3–1.2)
Total Protein: 7.3 g/dL (ref 6.0–8.3)

## 2013-02-28 NOTE — Progress Notes (Signed)
Case discussed with Dr. McLean immediately after the resident saw the patient.  We reviewed the residens history and exam and pertinent patient test results.  I agree with the assessment, diagnosis and plan of care documented in the resident's note. 

## 2013-03-31 ENCOUNTER — Ambulatory Visit (INDEPENDENT_AMBULATORY_CARE_PROVIDER_SITE_OTHER): Payer: Medicaid Other | Admitting: Internal Medicine

## 2013-03-31 ENCOUNTER — Encounter: Payer: Self-pay | Admitting: Internal Medicine

## 2013-03-31 VITALS — BP 122/76 | HR 68 | Temp 98.8°F | Ht 68.5 in | Wt 178.6 lb

## 2013-03-31 DIAGNOSIS — K219 Gastro-esophageal reflux disease without esophagitis: Secondary | ICD-10-CM

## 2013-03-31 DIAGNOSIS — R197 Diarrhea, unspecified: Secondary | ICD-10-CM

## 2013-03-31 DIAGNOSIS — R87610 Atypical squamous cells of undetermined significance on cytologic smear of cervix (ASC-US): Secondary | ICD-10-CM

## 2013-03-31 DIAGNOSIS — R110 Nausea: Secondary | ICD-10-CM

## 2013-03-31 MED ORDER — PANTOPRAZOLE SODIUM 20 MG PO TBEC: 20.0000 mg | DELAYED_RELEASE_TABLET | Freq: Every day | ORAL | Status: DC

## 2013-03-31 NOTE — Assessment & Plan Note (Signed)
Possibly related to GERD, not related to h/a Will tx for GERD and see if helps  She will have OBGYN visit 04/04/13

## 2013-03-31 NOTE — Patient Instructions (Addendum)
Follow up in 2-4 weeks  Pick you your Protonix from your pharmacy   Pantoprazole tablets What is this medicine? PANTOPRAZOLE (pan TOE pra zole) prevents the production of acid in the stomach. It is used to treat gastroesophageal reflux disease (GERD), inflammation of the esophagus, and Zollinger-Ellison syndrome. This medicine may be used for other purposes; ask your health care provider or pharmacist if you have questions. What should I tell my health care provider before I take this medicine? They need to know if you have any of these conditions: -liver disease -low levels of magnesium in the blood -an unusual or allergic reaction to omeprazole, lansoprazole, pantoprazole, rabeprazole, other medicines, foods, dyes, or preservatives -pregnant or trying to get pregnant -breast-feeding How should I use this medicine? Take this medicine by mouth. Swallow the tablets whole with a drink of water. Follow the directions on the prescription label. Do not crush, break, or chew. Take your medicine at regular intervals. Do not take your medicine more often than directed. Talk to your pediatrician regarding the use of this medicine in children. While this drug may be prescribed for children as young as 5 years for selected conditions, precautions do apply. Overdosage: If you think you have taken too much of this medicine contact a poison control center or emergency room at once. NOTE: This medicine is only for you. Do not share this medicine with others. What if I miss a dose? If you miss a dose, take it as soon as you can. If it is almost time for your next dose, take only that dose. Do not take double or extra doses. What may interact with this medicine? Do not take this medicine with any of the following medications: -atazanavir -nelfinavir This medicine may also interact with the following medications: -ampicillin -delavirdine -digoxin -diuretics -iron salts -medicines for fungal infections  like ketoconazole, itraconazole and voriconazole -warfarin This list may not describe all possible interactions. Give your health care provider a list of all the medicines, herbs, non-prescription drugs, or dietary supplements you use. Also tell them if you smoke, drink alcohol, or use illegal drugs. Some items may interact with your medicine. What should I watch for while using this medicine? It can take several days before your stomach pain gets better. Check with your doctor or health care professional if your condition does not start to get better, or if it gets worse. You may need blood work done while you are taking this medicine. What side effects may I notice from receiving this medicine? Side effects that you should report to your doctor or health care professional as soon as possible: -allergic reactions like skin rash, itching or hives, swelling of the face, lips, or tongue -bone, muscle or joint pain -breathing problems -chest pain or chest tightness -dark yellow or brown urine -dizziness -fast, irregular heartbeat -feeling faint or lightheaded -fever or sore throat -muscle spasm -palpitations -redness, blistering, peeling or loosening of the skin, including inside the mouth -seizures -tremors -unusual bleeding or bruising -unusually weak or tired -yellowing of the eyes or skin Side effects that usually do not require medical attention (Report these to your doctor or health care professional if they continue or are bothersome.): -constipation -diarrhea -dry mouth -headache -nausea This list may not describe all possible side effects. Call your doctor for medical advice about side effects. You may report side effects to FDA at 1-800-FDA-1088. Where should I keep my medicine? Keep out of the reach of children. Store at room temperature between  15 and 30 degrees C (59 and 86 degrees F). Protect from light and moisture. Throw away any unused medicine after the expiration  date. NOTE: This sheet is a summary. It may not cover all possible information. If you have questions about this medicine, talk to your doctor, pharmacist, or health care provider.  2013, [employer]. (11/27/2009 12:03:53 PM)  Gastroesophageal Reflux Disease, Adult Gastroesophageal reflux disease (GERD) happens when acid from your stomach flows up into the esophagus. When acid comes in contact with the esophagus, the acid causes soreness (inflammation) in the esophagus. Over time, GERD may create small holes (ulcers) in the lining of the esophagus. CAUSES   Increased body weight. This puts pressure on the stomach, making acid rise from the stomach into the esophagus.  Smoking. This increases acid production in the stomach.  Drinking alcohol. This causes decreased pressure in the lower esophageal sphincter (valve or ring of muscle between the esophagus and stomach), allowing acid from the stomach into the esophagus.  Late evening meals and a full stomach. This increases pressure and acid production in the stomach.  A malformed lower esophageal sphincter. Sometimes, no cause is found. SYMPTOMS   Burning pain in the lower part of the mid-chest behind the breastbone and in the mid-stomach area. This may occur twice a week or more often.  Trouble swallowing.  Sore throat.  Dry cough.  Asthma-like symptoms including chest tightness, shortness of breath, or wheezing. DIAGNOSIS  Your caregiver may be able to diagnose GERD based on your symptoms. In some cases, X-rays and other tests may be done to check for complications or to check the condition of your stomach and esophagus. TREATMENT  Your caregiver may recommend over-the-counter or prescription medicines to help decrease acid production. Ask your caregiver before starting or adding any new medicines.  HOME CARE INSTRUCTIONS   Change the factors that you can control. Ask your caregiver for guidance concerning weight loss,  quitting smoking, and alcohol consumption.  Avoid foods and drinks that make your symptoms worse, such as:  Caffeine or alcoholic drinks.  Chocolate.  Peppermint or mint flavorings.  Garlic and onions.  Spicy foods.  Citrus fruits, such as oranges, lemons, or limes.  Tomato-based foods such as sauce, chili, salsa, and pizza.  Fried and fatty foods.  Avoid lying down for the 3 hours prior to your bedtime or prior to taking a nap.  Eat small, frequent meals instead of large meals.  Wear loose-fitting clothing. Do not wear anything tight around your waist that causes pressure on your stomach.  Raise the head of your bed 6 to 8 inches with wood blocks to help you sleep. Extra pillows will not help.  Only take over-the-counter or prescription medicines for pain, discomfort, or fever as directed by your caregiver.  Do not take aspirin, ibuprofen, or other nonsteroidal anti-inflammatory drugs (NSAIDs). SEEK IMMEDIATE MEDICAL CARE IF:   You have pain in your arms, neck, jaw, teeth, or back.  Your pain increases or changes in intensity or duration.  You develop nausea, vomiting, or sweating (diaphoresis).  You develop shortness of breath, or you faint.  Your vomit is green, yellow, black, or looks like coffee grounds or blood.  Your stool is red, bloody, or black. These symptoms could be signs of other problems, such as heart disease, gastric bleeding, or esophageal bleeding. MAKE SURE YOU:   Understand these instructions.  Will watch your condition.  Will get help right away if you are not doing well or  get worse. Document Released: 06/18/2005 Document Revised: 12/01/2011 Document Reviewed: 03/28/2011 [facility] Patient Information 2014 [place], [place].  Diet for Diarrhea, Adult Frequent, runny stools (diarrhea) may be caused or worsened by food or drink. Diarrhea may be relieved by changing your diet. Since diarrhea can last up to 7 days, it is easy for you to lose too  much fluid from the body and become dehydrated. Fluids that are lost need to be replaced. Along with a modified diet, make sure you drink enough fluids to keep your urine clear or pale yellow. DIET INSTRUCTIONS  Ensure adequate fluid intake (hydration): have 1 cup (8 oz) of fluid for each diarrhea episode. Avoid fluids that contain simple sugars or sports drinks, fruit juices, whole milk products, and sodas. Your urine should be clear or pale yellow if you are drinking enough fluids. Hydrate with an oral rehydration solution that you can purchase at pharmacies, retail stores, and online. You can prepare an oral rehydration solution at home by mixing the following ingredients together:    tsp table salt.   tsp baking soda.   tsp salt substitute containing potassium chloride.  1  tablespoons sugar.  1 L (34 oz) of water.  Certain foods and beverages may increase the speed at which food moves through the gastrointestinal (GI) tract. These foods and beverages should be avoided and include:  Caffeinated and alcoholic beverages.  High-fiber foods, such as raw fruits and vegetables, nuts, seeds, and whole grain breads and cereals.  Foods and beverages sweetened with sugar alcohols, such as xylitol, sorbitol, and mannitol.  Some foods may be well tolerated and may help thicken stool including:  Starchy foods, such as rice, toast, pasta, low-sugar cereal, oatmeal, grits, baked potatoes, crackers, and bagels.   Bananas.   Applesauce.  Add probiotic-rich foods to help increase healthy bacteria in the GI tract, such as yogurt and fermented milk products. RECOMMENDED FOODS AND BEVERAGES Starches Choose foods with less than 2 g of fiber per serving.  Recommended:  White, [place], and pita breads, plain rolls, buns, bagels. Plain muffins, matzo. Soda, saltine, or graham crackers. Pretzels, melba toast, zwieback. Cooked cereals made with water: cornmeal, farina, cream cereals. Dry cereals:  refined corn, wheat, rice. Potatoes prepared any way without skins, refined macaroni, spaghetti, noodles, refined rice.  Avoid:  Bread, rolls, or crackers made with whole wheat, multi-grains, rye, bran seeds, nuts, or coconut. Corn tortillas or taco shells. Cereals containing whole grains, multi-grains, bran, coconut, nuts, raisins. Cooked or dry oatmeal. Coarse wheat cereals, granola. Cereals advertised as "high-fiber." Potato skins. Whole grain pasta, wild or brown rice. Popcorn. Sweet potatoes, yams. Sweet rolls, doughnuts, waffles, pancakes, sweet breads. Vegetables  Recommended: Strained tomato and vegetable juices. Most well-cooked and canned vegetables without seeds. Fresh: Tender lettuce, cucumber without the skin, cabbage, spinach, bean sprouts.  Avoid: Fresh, cooked, or canned: Artichokes, baked beans, beet greens, broccoli, Brussels sprouts, corn, kale, legumes, peas, sweet potatoes. Cooked: Green or red cabbage, spinach. Avoid large servings of any vegetables because vegetables shrink when cooked, and they contain more fiber per serving than fresh vegetables. Fruit  Recommended: Cooked or canned: Apricots, applesauce, cantaloupe, cherries, fruit cocktail, grapefruit, grapes, kiwi, mandarin oranges, peaches, pears, plums, watermelon. Fresh: Apples without skin, ripe banana, grapes, cantaloupe, cherries, grapefruit, peaches, oranges, plums. Keep servings limited to  cup or 1 piece.  Avoid: Fresh: Apples with skin, apricots, mangoes, pears, raspberries, strawberries. Prune juice, stewed or dried prunes. Dried fruits, raisins, dates. Large servings of all fresh fruits.  Protein  Recommended: Ground or well-cooked tender beef, ham, veal, lamb, pork, or poultry. Eggs. Fish, oysters, shrimp, lobster, other seafoods. Liver, organ meats.  Avoid: Tough, fibrous meats with gristle. Peanut butter, smooth or chunky. Cheese, nuts, seeds, legumes, dried peas, beans, lentils. Dairy  Recommended:  Yogurt, lactose-free milk, kefir, drinkable yogurt, buttermilk, soy milk, or plain hard cheese.  Avoid: Milk, chocolate milk, beverages made with milk, such as milkshakes. Soups  Recommended: Bouillon, broth, or soups made from allowed foods. Any strained soup.  Avoid: Soups made from vegetables that are not allowed, cream or milk-based soups. Desserts and Sweets  Recommended: Sugar-free gelatin, sugar-free frozen ice pops made without sugar alcohol.  Avoid: Plain cakes and cookies, pie made with fruit, pudding, custard, cream pie. Gelatin, fruit, ice, sherbet, frozen ice pops. Ice cream, ice milk without nuts. Plain hard candy, honey, jelly, molasses, syrup, sugar, chocolate syrup, gumdrops, marshmallows. Fats and Oils  Recommended: Limit fats to less than 8 tsp per day.  Avoid: Seeds, nuts, olives, avocados. Margarine, butter, cream, mayonnaise, salad oils, plain salad dressings. Plain gravy, crisp bacon without rind. Beverages  Recommended: Water, decaffeinated teas, oral rehydration solutions, sugar-free beverages not sweetened with sugar alcohols.  Avoid: Fruit juices, caffeinated beverages (coffee, tea, soda), alcohol, sports drinks, or lemon-lime soda. Condiments  Recommended: Ketchup, mustard, horseradish, vinegar, cocoa powder. Spices in moderation: allspice, basil, bay leaves, celery powder or leaves, cinnamon, cumin powder, curry powder, ginger, mace, marjoram, onion or garlic powder, oregano, paprika, parsley flakes, ground pepper, rosemary, sage, savory, tarragon, thyme, turmeric.  Avoid: Coconut, honey. Document Released: 11/29/2003 Document Revised: 06/02/2012 Document Reviewed: 01/23/2012 [facility] Patient Information 2014 [place], [place].  Diarrhea Diarrhea is frequent loose and watery bowel movements. It can cause you to feel weak and dehydrated. Dehydration can cause you to become tired and thirsty, have a dry mouth, and have decreased urination that often is dark  yellow. Diarrhea is a sign of another problem, most often an infection that will not last long. In most cases, diarrhea typically lasts 2 3 days. However, it can last longer if it is a sign of something more serious. It is important to treat your diarrhea as directed by your caregive to lessen or prevent future episodes of diarrhea. CAUSES  Some common causes include:  Gastrointestinal infections caused by viruses, bacteria, or parasites.  Food poisoning or food allergies.  Certain medicines, such as antibiotics, chemotherapy, and laxatives.  Artificial sweeteners and fructose.  Digestive disorders. HOME CARE INSTRUCTIONS  Ensure adequate fluid intake (hydration): have 1 cup (8 oz) of fluid for each diarrhea episode. Avoid fluids that contain simple sugars or sports drinks, fruit juices, whole milk products, and sodas. Your urine should be clear or pale yellow if you are drinking enough fluids. Hydrate with an oral rehydration solution that you can purchase at pharmacies, retail stores, and online. You can prepare an oral rehydration solution at home by mixing the following ingredients together:    tsp table salt.   tsp baking soda.   tsp salt substitute containing potassium chloride.  1  tablespoons sugar.  1 L (34 oz) of water.  Certain foods and beverages may increase the speed at which food moves through the gastrointestinal (GI) tract. These foods and beverages should be avoided and include:  Caffeinated and alcoholic beverages.  High-fiber foods, such as raw fruits and vegetables, nuts, seeds, and whole grain breads and cereals.  Foods and beverages sweetened with sugar alcohols, such as xylitol, sorbitol, and mannitol.  Some  foods may be well tolerated and may help thicken stool including:  Starchy foods, such as rice, toast, pasta, low-sugar cereal, oatmeal, grits, baked potatoes, crackers, and bagels.  Bananas.  Applesauce.  Add probiotic-rich foods to help  increase healthy bacteria in the GI tract, such as yogurt and fermented milk products.  Wash your hands well after each diarrhea episode.  Only take over-the-counter or prescription medicines as directed by your caregiver.  Take a warm bath to relieve any burning or pain from frequent diarrhea episodes. SEEK IMMEDIATE MEDICAL CARE IF:   You are unable to keep fluids down.  You have persistent vomiting.  You have blood in your stool, or your stools are black and tarry.  You do not urinate in 6 8 hours, or there is only a small amount of very dark urine.  You have abdominal pain that increases or localizes.  You have weakness, dizziness, confusion, or lightheadedness.  You have a severe headache.  Your diarrhea gets worse or does not get better.  You have a fever or persistent symptoms for more than 2 3 days.  You have a fever and your symptoms suddenly get worse. MAKE SURE YOU:   Understand these instructions.  Will watch your condition.  Will get help right away if you are not doing well or get worse. Document Released: 08/29/2002 Document Revised: 08/25/2012 Document Reviewed: 05/16/2012 [facility] Patient Information 2014 [place], [place].  Nausea, Adult Nausea is the feeling that you have an upset stomach or have to vomit. Nausea by itself is not likely a serious concern, but it may be an early sign of more serious medical problems. As nausea gets worse, it can lead to vomiting. If vomiting develops, there is the risk of dehydration.  CAUSES   Viral infections.  Food poisoning.  Medicines.  Pregnancy.  Motion sickness.  Migraine headaches.  Emotional distress.  Severe pain from any source.  Alcohol intoxication. HOME CARE INSTRUCTIONS  Get plenty of rest.  Ask your caregiver about specific rehydration instructions.  Eat small amounts of food and sip liquids more often.  Take all medicines as told by your caregiver. SEEK MEDICAL CARE IF:  You have  not improved after 2 days, or you get worse.  You have a headache. SEEK IMMEDIATE MEDICAL CARE IF:   You have a fever.  You faint.  You keep vomiting or have blood in your vomit.  You are extremely weak or dehydrated.  You have dark or bloody stools.  You have severe chest or abdominal pain. MAKE SURE YOU:  Understand these instructions.  Will watch your condition.  Will get help right away if you are not doing well or get worse. Document Released: 10/16/2004 Document Revised: 06/02/2012 Document Reviewed: 05/21/2011 [facility] Patient Information 2014 [place], [place].

## 2013-03-31 NOTE — Progress Notes (Signed)
  Subjective:    Patient ID: Raven Webb, female    DOB: 05/11/1986, 27 y.o.   MRN: [id number]  HPI Comments: 27 y.o PMH HTN (BP 122/76 on HCTZ since 2009), ASCUS pap (appt with OBGYN 7/14), chronic abdominal pain and diarrhea and nausea.  Patient has felt nauseated in the night and lays down when this happens.  She complains of left inframammary sharp pain that comes and goes and lasts 3 min and happens 3/7 days per week.  She sits up and drinks water to try to help.  This has been going on for >6 months.  She does have heartburn problems and notices a sour taste in her mouth in the am.  She has had diarrhea for > 1 year mostly in the am 1-2 episodes per day. Stool looks yellow green.  She does not drink milk, denies sugar substitutes, denies diet sodas. She has chronic h/a's with no relation to nausea.  She has bouts of constipation sometimes.  She denies alcohol use.  Her last tsh was normal 09/2010.  She denies use of laxatives. She is not sexually active.   SH: 3 kids 2 boys and 1 girl agess 9, 5, 2. Currently unemployed.      Review of Systems  Constitutional: Negative for fever, chills and unexpected weight change.  Gastrointestinal: Negative for blood in stool.  Genitourinary: Negative for dysuria.       Objective:   Physical Exam  Nursing note and vitals reviewed. Constitutional: She is oriented to person, place, and time. Vital signs are normal. She appears well-developed and well-nourished. She is cooperative. No distress.  HENT:  Head: Normocephalic and atraumatic.  Mouth/Throat: No oropharyngeal exudate.  Eyes: Conjunctivae are normal. Right eye exhibits no discharge. Left eye exhibits no discharge. No scleral icterus.  Cardiovascular: Normal rate, regular rhythm, S1 normal, S2 normal and normal heart sounds.   No murmur heard. Pulmonary/Chest: Effort normal and breath sounds normal. No respiratory distress. She has no wheezes.  Abdominal: Soft. Bowel sounds are normal. There  is tenderness. There is no rebound and no guarding.  Multiple stria Mild ttp RUQ >  RLQ=LLQ  Neurological: She is alert and oriented to person, place, and time. Gait normal.  Skin: Skin is warm, dry and intact. No rash noted. She is not diaphoretic.  Psychiatric: She has a normal mood and affect. Her speech is normal and behavior is normal. Judgment and thought content normal. Cognition and memory are normal.          Assessment & Plan:  F/u 2-4 weeks

## 2013-03-31 NOTE — Assessment & Plan Note (Signed)
Chronic. ? Etiology may be related to IBS. 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  Doubt infectious  Sent home with stool w/u studies for [place] stain, stool Na, K, osm, pH, FOBT, stool culture, O&P, fecal leuks and lactoferrin, GI pathogens, C. Diff

## 2013-03-31 NOTE — Assessment & Plan Note (Signed)
Will try on Protonix 20 mg x 2 months

## 2013-03-31 NOTE — Assessment & Plan Note (Signed)
appt with OBGYN 04/04/13 encouraged to keep

## 2013-04-01 NOTE — Addendum Note (Signed)
Addended by: Remus Blake on: 04/01/2013 09:10 AM   Modules accepted: Orders

## 2013-04-04 ENCOUNTER — Encounter: Payer: Self-pay | Admitting: Obstetrics & Gynecology

## 2013-04-04 ENCOUNTER — Ambulatory Visit (INDEPENDENT_AMBULATORY_CARE_PROVIDER_SITE_OTHER): Payer: Medicaid Other | Admitting: Obstetrics & Gynecology

## 2013-04-04 ENCOUNTER — Other Ambulatory Visit (HOSPITAL_COMMUNITY)
Admission: RE | Admit: 2013-04-04 | Discharge: 2013-04-04 | Disposition: A | Discharge status: Home / Self Care | Payer: Medicaid Other | Source: Ambulatory Visit | Attending: Obstetrics & Gynecology | Admitting: Obstetrics & Gynecology

## 2013-04-04 VITALS — BP 141/92 | HR 67 | Temp 98.5°F | Ht 67.0 in | Wt 180.3 lb

## 2013-04-04 DIAGNOSIS — Z113 Encounter for screening for infections with a predominantly sexual mode of transmission: Secondary | ICD-10-CM | POA: Insufficient documentation

## 2013-04-04 DIAGNOSIS — Z01419 Encounter for gynecological examination (general) (routine) without abnormal findings: Secondary | ICD-10-CM

## 2013-04-04 DIAGNOSIS — Z3042 Encounter for surveillance of injectable contraceptive: Secondary | ICD-10-CM

## 2013-04-04 DIAGNOSIS — Z3200 Encounter for pregnancy test, result unknown: Secondary | ICD-10-CM

## 2013-04-04 DIAGNOSIS — Z3049 Encounter for surveillance of other contraceptives: Secondary | ICD-10-CM

## 2013-04-04 DIAGNOSIS — Z3202 Encounter for pregnancy test, result negative: Secondary | ICD-10-CM

## 2013-04-04 LAB — POCT PREGNANCY, URINE: Preg Test, Ur: NEGATIVE

## 2013-04-04 MED ORDER — MEDROXYPROGESTERONE ACETATE 150 MG/ML IM SUSP
150.0000 mg | Freq: Once | INTRAMUSCULAR | Status: AC
  Administered 2013-04-04: 150 mg via INTRAMUSCULAR

## 2013-04-04 NOTE — Progress Notes (Signed)
  Subjective:     Raven Webb is a 27 y.o. [phone] female and is here for a comprehensive physical exam. The patient desires STI screen, also desires Depo Provera for contraception.  History   Social History  . Marital Status: Single    Spouse Name: N/A    Number of Children: N/A  . Years of Education: N/A   Occupational History  . Not on file.   Social History Main Topics  . Smoking status: Never Smoker   . Smokeless tobacco: Never Used  . Alcohol Use: No  . Drug Use: No  . Sexually Active: Not Currently    Birth Control/ Protection: Injection   Other Topics Concern  . Not on file   Social History Narrative  . No narrative on file   Health Maintenance  Topic Date Due  . Pap Smear  03/14/2011  . Influenza Vaccine  05/23/2013  . Tetanus/tdap  09/22/2017   The following portions of the patient's history were reviewed and updated as appropriate: allergies, current medications, past family history, past medical history, past social history, past surgical history and problem list.  Review of Systems Pertinent items are noted in HPI.   Objective:   BP 141/92  Pulse 67  Temp(Src) 98.5 F (36.9 C) (Oral)  Ht 5\' 7"  (1.702 m)  Wt 180 lb 4.8 oz (81.784 kg)  BMI 28.23 kg/m2  LMP 03/24/2013 GENERAL: Well-developed, well-nourished female in no acute distress.  Multiple tattoos all all over her body HEENT: Normocephalic, atraumatic. Sclerae anicteric.  NECK: Supple. Normal thyroid.  LUNGS: Clear to auscultation bilaterally.  HEART: Regular rate and rhythm. BREASTS: Symmetric in size. No masses, skin changes, nipple drainage, or lymphadenopathy. ABDOMEN: Soft, nontender, nondistended. No organomegaly. PELVIC: Normal external female genitalia. Vagina is pink and rugated.  Normal discharge. Normal cervix contour. Pap smear obtained. Uterus is normal in size. No adnexal mass or tenderness.  EXTREMITIES: No cyanosis, clubbing, or edema, 2+ distal pulses.   Assessment:    Healthy female exam, wants STI screen.   Desires Depo Provera  Plan:   Pap and STI screen done, will follow up results and manage accordingly. Depo Provera given, come back in 3 months Routine preventative health maintenance measures emphasized

## 2013-04-04 NOTE — Patient Instructions (Signed)
Preventive Care for Adults, Female A healthy lifestyle and preventive care can promote health and wellness. Preventive health guidelines for women include the following key practices.  A routine yearly physical is a good way to check with your caregiver about your health and preventive screening. It is a chance to share any concerns and updates on your health, and to receive a thorough exam.  Visit your dentist for a routine exam and preventive care every 6 months. Brush your teeth twice a day and floss once a day. Good oral hygiene prevents tooth decay and gum disease.  The frequency of eye exams is based on your age, health, family medical history, use of contact lenses, and other factors. Follow your caregiver's recommendations for frequency of eye exams.  Eat a healthy diet. Foods like vegetables, fruits, whole grains, low-fat dairy products, and lean protein foods contain the nutrients you need without too many calories. Decrease your intake of foods high in solid fats, added sugars, and salt. Eat the right amount of calories for you.Get information about a proper diet from your caregiver, if necessary.  Regular physical exercise is one of the most important things you can do for your health. Most adults should get at least 150 minutes of moderate-intensity exercise (any activity that increases your heart rate and causes you to sweat) each week. In addition, most adults need muscle-strengthening exercises on 2 or more days a week.  Maintain a healthy weight. The body mass index (BMI) is a screening tool to identify possible weight problems. It provides an estimate of body fat based on height and weight. Your caregiver can help determine your BMI, and can help you achieve or maintain a healthy weight.For adults 20 years and older:  A BMI below 18.5 is considered underweight.  A BMI of 18.5 to 24.9 is normal.  A BMI of 25 to 29.9 is considered overweight.  A BMI of 30 and above is  considered obese.  Maintain normal blood lipids and cholesterol levels by exercising and minimizing your intake of saturated fat. Eat a balanced diet with plenty of fruit and vegetables. Blood tests for lipids and cholesterol should begin at age 20 and be repeated every 5 years. If your lipid or cholesterol levels are high, you are over 50, or you are at high risk for heart disease, you may need your cholesterol levels checked more frequently.Ongoing high lipid and cholesterol levels should be treated with medicines if diet and exercise are not effective.  If you smoke, find out from your caregiver how to quit. If you do not use tobacco, do not start.  If you are pregnant, do not drink alcohol. If you are breastfeeding, be very cautious about drinking alcohol. If you are not pregnant and choose to drink alcohol, do not exceed 1 drink per day. One drink is considered to be 12 ounces (355 mL) of beer, 5 ounces (148 mL) of wine, or 1.5 ounces (44 mL) of liquor.  Avoid use of street drugs. Do not share needles with anyone. Ask for help if you need support or instructions about stopping the use of drugs.  High blood pressure causes heart disease and increases the risk of stroke. Your blood pressure should be checked at least every 1 to 2 years. Ongoing high blood pressure should be treated with medicines if weight loss and exercise are not effective.  If you are 55 to 25 years old, ask your caregiver if you should take aspirin to prevent strokes.  Diabetes   screening involves taking a blood sample to check your fasting blood sugar level. This should be done once every 3 years, after age 45, if you are within normal weight and without risk factors for diabetes. Testing should be considered at a younger age or be carried out more frequently if you are overweight and have at least 1 risk factor for diabetes.  Breast cancer screening is essential preventive care for women. You should practice "breast  self-awareness." This means understanding the normal appearance and feel of your breasts and may include breast self-examination. Any changes detected, no matter how small, should be reported to a caregiver. Women in their 20s and 30s should have a clinical breast exam (CBE) by a caregiver as part of a regular health exam every 1 to 3 years. After age 40, women should have a CBE every year. Starting at age 40, women should consider having a mammography (breast X-ray test) every year. Women who have a family history of breast cancer should talk to their caregiver about genetic screening. Women at a high risk of breast cancer should talk to their caregivers about having magnetic resonance imaging (MRI) and a mammography every year.  The Pap test is a screening test for cervical cancer. A Pap test can show cell changes on the cervix that might become cervical cancer if left untreated. A Pap test is a procedure in which cells are obtained and examined from the lower end of the uterus (cervix).  Women should have a Pap test starting at age 21.  Between ages 21 and 29, Pap tests should be repeated every 2 years.  Beginning at age 30, you should have a Pap test every 3 years as long as the past 3 Pap tests have been normal.  Some women have medical problems that increase the chance of getting cervical cancer. Talk to your caregiver about these problems. It is especially important to talk to your caregiver if a new problem develops soon after your last Pap test. In these cases, your caregiver may recommend more frequent screening and Pap tests.  The above recommendations are the same for women who have or have not gotten the vaccine for human papillomavirus (HPV).  If you had a hysterectomy for a problem that was not cancer or a condition that could lead to cancer, then you no longer need Pap tests. Even if you no longer need a Pap test, a regular exam is a good idea to make sure no other problems are  starting.  If you are between ages 65 and 70, and you have had normal Pap tests going back 10 years, you no longer need Pap tests. Even if you no longer need a Pap test, a regular exam is a good idea to make sure no other problems are starting.  If you have had past treatment for cervical cancer or a condition that could lead to cancer, you need Pap tests and screening for cancer for at least 20 years after your treatment.  If Pap tests have been discontinued, risk factors (such as a new sexual partner) need to be reassessed to determine if screening should be resumed.  The HPV test is an additional test that may be used for cervical cancer screening. The HPV test looks for the virus that can cause the cell changes on the cervix. The cells collected during the Pap test can be tested for HPV. The HPV test could be used to screen women aged 30 years and older, and should   be used in women of any age who have unclear Pap test results. After the age of 30, women should have HPV testing at the same frequency as a Pap test.  Colorectal cancer can be detected and often prevented. Most routine colorectal cancer screening begins at the age of 50 and continues through age 75. However, your caregiver may recommend screening at an earlier age if you have risk factors for colon cancer. On a yearly basis, your caregiver may provide home test kits to check for hidden blood in the stool. Use of a small camera at the end of a tube, to directly examine the colon (sigmoidoscopy or colonoscopy), can detect the earliest forms of colorectal cancer. Talk to your caregiver about this at age 50, when routine screening begins. Direct examination of the colon should be repeated every 5 to 10 years through age 75, unless early forms of pre-cancerous polyps or small growths are found.  Hepatitis C blood testing is recommended for all people born from 1945 through 1965 and any individual with known risks for hepatitis C.  Practice  safe sex. Use condoms and avoid high-risk sexual practices to reduce the spread of sexually transmitted infections (STIs). STIs include gonorrhea, chlamydia, syphilis, trichomonas, herpes, HPV, and human immunodeficiency virus (HIV). Herpes, HIV, and HPV are viral illnesses that have no cure. They can result in disability, cancer, and death. Sexually active women aged 25 and younger should be checked for chlamydia. Older women with new or multiple partners should also be tested for chlamydia. Testing for other STIs is recommended if you are sexually active and at increased risk.  Osteoporosis is a disease in which the bones lose minerals and strength with aging. This can result in serious bone fractures. The risk of osteoporosis can be identified using a bone density scan. Women ages 65 and over and women at risk for fractures or osteoporosis should discuss screening with their caregivers. Ask your caregiver whether you should take a calcium supplement or vitamin D to reduce the rate of osteoporosis.  Menopause can be associated with physical symptoms and risks. Hormone replacement therapy is available to decrease symptoms and risks. You should talk to your caregiver about whether hormone replacement therapy is right for you.  Use sunscreen with sun protection factor (SPF) of 30 or more. Apply sunscreen liberally and repeatedly throughout the day. You should seek shade when your shadow is shorter than you. Protect yourself by wearing long sleeves, pants, a wide-brimmed hat, and sunglasses year round, whenever you are outdoors.  Once a month, do a whole body skin exam, using a mirror to look at the skin on your back. Notify your caregiver of new moles, moles that have irregular borders, moles that are larger than a pencil eraser, or moles that have changed in shape or color.  Stay current with required immunizations.  Influenza. You need a dose every fall (or winter). The composition of the flu vaccine  changes each year, so being vaccinated once is not enough.  Pneumococcal polysaccharide. You need 1 to 2 doses if you smoke cigarettes or if you have certain chronic medical conditions. You need 1 dose at age 65 (or older) if you have never been vaccinated.  Tetanus, diphtheria, pertussis (Tdap, Td). Get 1 dose of Tdap vaccine if you are younger than age 65, are over 65 and have contact with an infant, are a healthcare worker, are pregnant, or simply want to be protected from whooping cough. After that, you need a Td   booster dose every 10 years. Consult your caregiver if you have not had at least 3 tetanus and diphtheria-containing shots sometime in your life or have a deep or dirty wound.  HPV. You need this vaccine if you are a woman age 26 or younger. The vaccine is given in 3 doses over 6 months.  Measles, mumps, rubella (MMR). You need at least 1 dose of MMR if you were born in 1957 or later. You may also need a second dose.  Meningococcal. If you are age 19 to 21 and a first-year college student living in a residence hall, or have one of several medical conditions, you need to get vaccinated against meningococcal disease. You may also need additional booster doses.  Zoster (shingles). If you are age 60 or older, you should get this vaccine.  Varicella (chickenpox). If you have never had chickenpox or you were vaccinated but received only 1 dose, talk to your caregiver to find out if you need this vaccine.  Hepatitis A. You need this vaccine if you have a specific risk factor for hepatitis A virus infection or you simply wish to be protected from this disease. The vaccine is usually given as 2 doses, 6 to 18 months apart.  Hepatitis B. You need this vaccine if you have a specific risk factor for hepatitis B virus infection or you simply wish to be protected from this disease. The vaccine is given in 3 doses, usually over 6 months. Preventive Services / Frequency Ages 19 to 39  Blood  pressure check.** / Every 1 to 2 years.  Lipid and cholesterol check.** / Every 5 years beginning at age 20.  Clinical breast exam.** / Every 3 years for women in their 20s and 30s.  Pap test.** / Every 2 years from ages 21 through 29. Every 3 years starting at age 30 through age 65 or 70 with a history of 3 consecutive normal Pap tests.  HPV screening.** / Every 3 years from ages 30 through ages 65 to 70 with a history of 3 consecutive normal Pap tests.  Hepatitis C blood test.** / For any individual with known risks for hepatitis C.  Skin self-exam. / Monthly.  Influenza immunization.** / Every year.  Pneumococcal polysaccharide immunization.** / 1 to 2 doses if you smoke cigarettes or if you have certain chronic medical conditions.  Tetanus, diphtheria, pertussis (Tdap, Td) immunization. / A one-time dose of Tdap vaccine. After that, you need a Td booster dose every 10 years.  HPV immunization. / 3 doses over 6 months, if you are 26 and younger.  Measles, mumps, rubella (MMR) immunization. / You need at least 1 dose of MMR if you were born in 1957 or later. You may also need a second dose.  Meningococcal immunization. / 1 dose if you are age 19 to 21 and a first-year college student living in a residence hall, or have one of several medical conditions, you need to get vaccinated against meningococcal disease. You may also need additional booster doses.  Varicella immunization.** / Consult your caregiver.  Hepatitis A immunization.** / Consult your caregiver. 2 doses, 6 to 18 months apart.  Hepatitis B immunization.** / Consult your caregiver. 3 doses usually over 6 months. Ages 40 to 64  Blood pressure check.** / Every 1 to 2 years.  Lipid and cholesterol check.** / Every 5 years beginning at age 20.  Clinical breast exam.** / Every year after age 40.  Mammogram.** / Every year beginning at age 40   and continuing for as long as you are in good health. Consult with your  caregiver.  Pap test.** / Every 3 years starting at age 30 through age 65 or 70 with a history of 3 consecutive normal Pap tests.  HPV screening.** / Every 3 years from ages 30 through ages 65 to 70 with a history of 3 consecutive normal Pap tests.  Fecal occult blood test (FOBT) of stool. / Every year beginning at age 50 and continuing until age 75. You may not need to do this test if you get a colonoscopy every 10 years.  Flexible sigmoidoscopy or colonoscopy.** / Every 5 years for a flexible sigmoidoscopy or every 10 years for a colonoscopy beginning at age 50 and continuing until age 75.  Hepatitis C blood test.** / For all people born from 1945 through 1965 and any individual with known risks for hepatitis C.  Skin self-exam. / Monthly.  Influenza immunization.** / Every year.  Pneumococcal polysaccharide immunization.** / 1 to 2 doses if you smoke cigarettes or if you have certain chronic medical conditions.  Tetanus, diphtheria, pertussis (Tdap, Td) immunization.** / A one-time dose of Tdap vaccine. After that, you need a Td booster dose every 10 years.  Measles, mumps, rubella (MMR) immunization. / You need at least 1 dose of MMR if you were born in 1957 or later. You may also need a second dose.  Varicella immunization.** / Consult your caregiver.  Meningococcal immunization.** / Consult your caregiver.  Hepatitis A immunization.** / Consult your caregiver. 2 doses, 6 to 18 months apart.  Hepatitis B immunization.** / Consult your caregiver. 3 doses, usually over 6 months. Ages 65 and over  Blood pressure check.** / Every 1 to 2 years.  Lipid and cholesterol check.** / Every 5 years beginning at age 20.  Clinical breast exam.** / Every year after age 40.  Mammogram.** / Every year beginning at age 40 and continuing for as long as you are in good health. Consult with your caregiver.  Pap test.** / Every 3 years starting at age 30 through age 65 or 70 with a 3  consecutive normal Pap tests. Testing can be stopped between 65 and 70 with 3 consecutive normal Pap tests and no abnormal Pap or HPV tests in the past 10 years.  HPV screening.** / Every 3 years from ages 30 through ages 65 or 70 with a history of 3 consecutive normal Pap tests. Testing can be stopped between 65 and 70 with 3 consecutive normal Pap tests and no abnormal Pap or HPV tests in the past 10 years.  Fecal occult blood test (FOBT) of stool. / Every year beginning at age 50 and continuing until age 75. You may not need to do this test if you get a colonoscopy every 10 years.  Flexible sigmoidoscopy or colonoscopy.** / Every 5 years for a flexible sigmoidoscopy or every 10 years for a colonoscopy beginning at age 50 and continuing until age 75.  Hepatitis C blood test.** / For all people born from 1945 through 1965 and any individual with known risks for hepatitis C.  Osteoporosis screening.** / A one-time screening for women ages 65 and over and women at risk for fractures or osteoporosis.  Skin self-exam. / Monthly.  Influenza immunization.** / Every year.  Pneumococcal polysaccharide immunization.** / 1 dose at age 65 (or older) if you have never been vaccinated.  Tetanus, diphtheria, pertussis (Tdap, Td) immunization. / A one-time dose of Tdap vaccine if you are over   65 and have contact with an infant, are a healthcare worker, or simply want to be protected from whooping cough. After that, you need a Td booster dose every 10 years.  Varicella immunization.** / Consult your caregiver.  Meningococcal immunization.** / Consult your caregiver.  Hepatitis A immunization.** / Consult your caregiver. 2 doses, 6 to 18 months apart.  Hepatitis B immunization.** / Check with your caregiver. 3 doses, usually over 6 months. ** Family history and personal history of risk and conditions may change your caregiver's recommendations. Document Released: 11/04/2001 Document Revised: 12/01/2011  Document Reviewed: 02/03/2011 [employer] Patient Information 2014 [employer].  

## 2013-04-05 ENCOUNTER — Encounter: Payer: Self-pay | Admitting: Obstetrics & Gynecology

## 2013-04-05 LAB — RPR

## 2013-04-06 ENCOUNTER — Other Ambulatory Visit: Payer: Medicaid Other

## 2013-04-06 NOTE — Progress Notes (Signed)
Case discussed with Dr. McLean at the time of the visit.  We reviewed the resident's history and exam and pertinent patient test results.  I agree with the assessment, diagnosis, and plan of care documented in the resident's note.     

## 2013-04-06 NOTE — Addendum Note (Signed)
Addended by: Bufford Spikes on: 04/06/2013 04:50 PM   Modules accepted: Orders

## 2013-04-07 LAB — GIARDIA/CRYPTOSPORIDIUM (EIA): Cryptosporidium Screen (EIA): NEGATIVE

## 2013-04-07 LAB — CLOSTRIDIUM DIFFICILE BY PCR: Toxigenic C. Difficile by PCR: NOT DETECTED

## 2013-04-10 LAB — STOOL CULTURE

## 2013-04-11 LAB — FECAL FAT QUALITATIVE
Free Fatty Acids: NORMAL
NEUTRAL FAT: NORMAL

## 2013-04-11 LAB — OSMOLALITY, STOOL

## 2013-04-11 LAB — PH, STOOL

## 2013-04-11 NOTE — Addendum Note (Signed)
Addended by: Bufford Spikes on: 04/11/2013 11:44 AM   Modules accepted: Orders

## 2013-04-14 ENCOUNTER — Encounter: Payer: Medicaid Other | Admitting: Internal Medicine

## 2013-04-21 ENCOUNTER — Encounter: Payer: Medicaid Other | Admitting: Internal Medicine

## 2013-05-05 ENCOUNTER — Encounter: Payer: Medicaid Other | Admitting: Internal Medicine

## 2013-06-16 ENCOUNTER — Encounter: Payer: Self-pay | Admitting: Internal Medicine

## 2013-06-16 ENCOUNTER — Ambulatory Visit: Payer: Medicaid Other | Admitting: Internal Medicine

## 2013-06-20 ENCOUNTER — Ambulatory Visit: Payer: Medicaid Other

## 2013-06-23 ENCOUNTER — Encounter: Payer: Medicaid Other | Admitting: Internal Medicine

## 2013-06-23 ENCOUNTER — Ambulatory Visit (INDEPENDENT_AMBULATORY_CARE_PROVIDER_SITE_OTHER): Payer: Medicaid Other | Admitting: Obstetrics and Gynecology

## 2013-06-23 VITALS — BP 134/90 | HR 85 | Ht 67.0 in | Wt 178.0 lb

## 2013-06-23 DIAGNOSIS — Z3049 Encounter for surveillance of other contraceptives: Secondary | ICD-10-CM

## 2013-06-23 MED ORDER — MEDROXYPROGESTERONE ACETATE 150 MG/ML IM SUSP
150.0000 mg | Freq: Once | INTRAMUSCULAR | Status: AC
  Administered 2013-06-23: 150 mg via INTRAMUSCULAR

## 2013-07-21 ENCOUNTER — Encounter: Payer: Medicaid Other | Admitting: Internal Medicine

## 2013-07-21 ENCOUNTER — Encounter: Payer: Self-pay | Admitting: Internal Medicine

## 2013-07-25 ENCOUNTER — Encounter: Payer: Self-pay | Admitting: Internal Medicine

## 2013-07-28 ENCOUNTER — Other Ambulatory Visit: Payer: Self-pay

## 2013-08-07 ENCOUNTER — Encounter (HOSPITAL_COMMUNITY): Payer: Self-pay | Admitting: Emergency Medicine

## 2013-08-07 ENCOUNTER — Emergency Department (INDEPENDENT_AMBULATORY_CARE_PROVIDER_SITE_OTHER)
Admission: EM | Admit: 2013-08-07 | Discharge: 2013-08-07 | Disposition: A | Discharge status: Home / Self Care | Payer: Medicaid Other | Source: Home / Self Care | Attending: Family Medicine | Admitting: Family Medicine

## 2013-08-07 DIAGNOSIS — B300 Keratoconjunctivitis due to adenovirus: Secondary | ICD-10-CM

## 2013-08-07 MED ORDER — TETRACAINE HCL 0.5 % OP SOLN
OPHTHALMIC | Status: AC
  Filled 2013-08-07: qty 2

## 2013-08-07 MED ORDER — MOXIFLOXACIN HCL 0.5 % OP SOLN: 1.0000 [drp] | OPHTHALMIC | Status: DC

## 2013-08-07 NOTE — ED Notes (Signed)
Pt c/o right eye irritation x 1 day. Eye is red and inflamed and has been swelling. Pt reports she has her contact lenses in her eye still. Had mucous discharge this morning and lots of tears last night. Tried using a warm compress with mild relief. Pt is alert and oriented and in no acute distress.

## 2013-08-07 NOTE — ED Provider Notes (Signed)
CSN: [id number]     Arrival date & time 08/07/13  1436 History   First MD Initiated Contact with Patient 08/07/13 1543     Chief Complaint  Patient presents with  . Eye Problem   (Consider location/radiation/quality/duration/timing/severity/associated sxs/prior Treatment) Patient is a 27 y.o. female presenting with eye problem. The history is provided by the patient.  Eye Problem Location:  L eye Quality:  Burning Severity:  Mild Onset quality:  Gradual Progression:  Worsening Chronicity:  New Context: contact lenses   Worsened by:  Contact lenses Associated symptoms: crusting, discharge, itching, redness and tearing   Associated symptoms: no double vision and no photophobia     Past Medical History  Diagnosis Date  . Hypertension   . UTI (lower urinary tract infection)    Past Surgical History  Procedure Laterality Date  . Wisdom tooth extraction     Family History  Problem Relation Age of Onset  . Hypertension Mother   . Diabetes Mother   . Hypertension Father   . Hypertension Sister   . Diabetes Sister   . Hypertension Maternal Uncle   . Kidney disease Maternal Uncle    History  Substance Use Topics  . Smoking status: Never Smoker   . Smokeless tobacco: Never Used  . Alcohol Use: No   OB History   Grav Para Term Preterm Abortions TAB SAB Ect Mult Living   3 3 1 2      3      Review of Systems  Constitutional: Negative.   HENT: Negative.   Eyes: Positive for pain, discharge, redness and itching. Negative for double vision, photophobia and visual disturbance.  Respiratory: Negative.     Allergies  Review of patient's allergies indicates no known allergies.  Home Medications   Current Outpatient Rx  Name  Route  Sig  Dispense  Refill  . hydrochlorothiazide (HYDRODIURIL) 12.5 MG tablet   Oral   Take 1 tablet (12.5 mg total) by mouth daily.   30 tablet   11   . moxifloxacin (VIGAMOX) 0.5 % ophthalmic solution   Left Eye   Place 1 drop into the  left eye as directed. Every 2 hrs.   3 mL   0   . pantoprazole (PROTONIX) 20 MG tablet   Oral   Take 1 tablet (20 mg total) by mouth daily.   30 tablet   1    BP 130/90  Pulse 71  Temp(Src) 98.9 F (37.2 C) (Oral)  Resp 17  SpO2 100% Physical Exam  Nursing note and vitals reviewed. Constitutional: She appears well-developed and well-nourished.  Eyes: EOM are normal. Pupils are equal, round, and reactive to light. Left eye exhibits discharge. Left conjunctiva is injected.  Neck: Normal range of motion. Neck supple.  Skin: Skin is warm and dry.    ED Course  Procedures (including critical care time) Labs Review Labs Reviewed - No data to display Imaging Review No results found.  EKG Interpretation     Ventricular Rate:    PR Interval:    QRS Duration:   QT Interval:    QTC Calculation:   R Axis:     Text Interpretation:              MDM  Discussed with dr Randon Goldsmith, care as suggested,    Linna Hoff, MD 08/07/13 [phone]

## 2013-08-23 ENCOUNTER — Encounter: Payer: Self-pay | Admitting: Internal Medicine

## 2013-09-07 ENCOUNTER — Ambulatory Visit: Payer: Medicaid Other | Admitting: Internal Medicine

## 2013-09-08 ENCOUNTER — Ambulatory Visit (INDEPENDENT_AMBULATORY_CARE_PROVIDER_SITE_OTHER): Payer: Medicaid Other | Admitting: *Deleted

## 2013-09-08 VITALS — BP 137/87 | HR 70 | Temp 97.7°F | Wt 183.8 lb

## 2013-09-08 DIAGNOSIS — Z3049 Encounter for surveillance of other contraceptives: Secondary | ICD-10-CM

## 2013-09-08 MED ORDER — MEDROXYPROGESTERONE ACETATE 104 MG/0.65ML ~~LOC~~ SUSP
104.0000 mg | Freq: Once | SUBCUTANEOUS | Status: AC
  Administered 2013-09-08: 104 mg via SUBCUTANEOUS

## 2013-11-30 ENCOUNTER — Ambulatory Visit: Payer: Medicaid Other

## 2014-07-24 ENCOUNTER — Encounter (HOSPITAL_COMMUNITY): Payer: Self-pay | Admitting: Emergency Medicine

## 2015-01-02 ENCOUNTER — Encounter (HOSPITAL_COMMUNITY): Payer: Self-pay | Admitting: *Deleted

## 2015-01-02 ENCOUNTER — Inpatient Hospital Stay (HOSPITAL_COMMUNITY)
Admission: EM | Admit: 2015-01-02 | Discharge: 2015-01-02 | Disposition: A | Discharge status: Home / Self Care | Payer: Medicaid Other | Source: Ambulatory Visit | Attending: Family Medicine | Admitting: Family Medicine

## 2015-01-02 DIAGNOSIS — N939 Abnormal uterine and vaginal bleeding, unspecified: Secondary | ICD-10-CM | POA: Diagnosis not present

## 2015-01-02 DIAGNOSIS — R109 Unspecified abdominal pain: Secondary | ICD-10-CM | POA: Insufficient documentation

## 2015-01-02 DIAGNOSIS — N923 Ovulation bleeding: Secondary | ICD-10-CM | POA: Diagnosis present

## 2015-01-02 LAB — CBC
HCT: 35.9 % — ABNORMAL LOW (ref 36.0–46.0)
Hemoglobin: 12.3 g/dL (ref 12.0–15.0)
MCH: 30 pg (ref 26.0–34.0)
MCHC: 34.3 g/dL (ref 30.0–36.0)
MCV: 87.6 fL (ref 78.0–100.0)
Platelets: 326 10*3/uL (ref 150–400)
RBC: 4.1 MIL/uL (ref 3.87–5.11)
RDW: 13.3 % (ref 11.5–15.5)
WBC: 4.9 10*3/uL (ref 4.0–10.5)

## 2015-01-02 LAB — URINALYSIS, ROUTINE W REFLEX MICROSCOPIC
Bilirubin Urine: NEGATIVE
GLUCOSE, UA: NEGATIVE mg/dL
Ketones, ur: NEGATIVE mg/dL
Leukocytes, UA: NEGATIVE
Nitrite: NEGATIVE
PH: 6 (ref 5.0–8.0)
Protein, ur: NEGATIVE mg/dL
SPECIFIC GRAVITY, URINE: 1.015 (ref 1.005–1.030)
Urobilinogen, UA: 0.2 mg/dL (ref 0.0–1.0)

## 2015-01-02 LAB — URINE MICROSCOPIC-ADD ON

## 2015-01-02 LAB — WET PREP, GENITAL
Clue Cells Wet Prep HPF POC: NONE SEEN
Trich, Wet Prep: NONE SEEN
Yeast Wet Prep HPF POC: NONE SEEN

## 2015-01-02 LAB — HCG, QUANTITATIVE, PREGNANCY: hCG, Beta Chain, Quant, S: 1 m[IU]/mL (ref <5)

## 2015-01-02 LAB — POCT PREGNANCY, URINE: Preg Test, Ur: NEGATIVE

## 2015-01-02 MED ORDER — NAPROXEN SODIUM 275 MG PO TABS: 275.0000 mg | ORAL_TABLET | Freq: Two times a day (BID) | ORAL | Status: DC

## 2015-01-02 NOTE — MAU Provider Note (Signed)
History     CSN: [id number]  Arrival date and time: 01/02/15 1050   None     Chief Complaint  Patient presents with  . Vaginal Bleeding  . Abdominal Pain   Vaginal Bleeding The patient's primary symptoms include pelvic pain and vaginal bleeding. This is a new problem. The current episode started in the past 7 days. The problem occurs constantly. The problem has been unchanged. The pain is mild. The problem affects both sides. She is not pregnant. Associated symptoms include abdominal pain. The vaginal bleeding is heavier than menses. She has been passing clots. She has not been passing tissue. Nothing aggravates the symptoms. She has tried nothing for the symptoms. She is sexually active. It is unknown whether or not her partner has an STD. She uses nothing for contraception. Her menstrual history has been regular.  Abdominal Pain   29 y.o. Webb presents to the MAU stating that she has been passing clots about the size of a nickel and having heavy vaginal bleeding for approximately a week. Her last period was only brown spotting and not typical for her. She states that this vaginal bleeding is not typically when she has her period. She is having mild to moderate cramping . Past Medical History  Diagnosis Date  . Hypertension   . UTI (lower urinary tract infection)     Past Surgical History  Procedure Laterality Date  . Wisdom tooth extraction      Family History  Problem Relation Age of Onset  . Hypertension Mother   . Diabetes Mother   . Hypertension Father   . Hypertension Sister   . Diabetes Sister   . Hypertension Maternal Uncle   . Kidney disease Maternal Uncle     History  Substance Use Topics  . Smoking status: Never Smoker   . Smokeless tobacco: Never Used  . Alcohol Use: No    Allergies: No Known Allergies  Prescriptions prior to admission  Medication Sig Dispense Refill Last Dose  . hydrochlorothiazide (HYDRODIURIL) 12.5 MG tablet Take 1 tablet (12.5  mg total) by mouth daily. (Patient not taking: Reported on 01/02/2015) 30 tablet 11 Taking  . moxifloxacin (VIGAMOX) 0.5 % ophthalmic solution Place 1 drop into the left eye as directed. Every 2 hrs. (Patient not taking: Reported on 01/02/2015) 3 mL 0 Not Taking  . pantoprazole (PROTONIX) 20 MG tablet Take 1 tablet (20 mg total) by mouth daily. (Patient not taking: Reported on 01/02/2015) 30 tablet 1 Taking    Review of Systems  Gastrointestinal: Positive for abdominal pain.  Genitourinary: Positive for vaginal bleeding and pelvic pain.       Vaginal bleeding with clots  All other systems reviewed and are negative.  Physical Exam   Blood pressure 141/88, pulse 62, temperature 98.2 F (36.8 C), temperature source Oral, resp. rate 18, height 5' 7.5" (1.715 m), weight 83.008 kg (183 lb), last menstrual period 12/30/2014.  Physical Exam  Nursing note and vitals reviewed. Constitutional: She appears well-developed and well-nourished. No distress.  Neck: Normal range of motion.  Cardiovascular: Normal rate.   Respiratory: Effort normal. No respiratory distress.  GI: Soft.  Genitourinary: There is no rash, tenderness, lesion or injury on the right labia. There is no rash, tenderness, lesion or injury on the left labia. Uterus is not deviated, not enlarged, not fixed and not tender. Cervix exhibits discharge. Cervix exhibits no motion tenderness. Right adnexum displays no mass, no tenderness and no fullness. Left adnexum displays no mass, no tenderness  and no fullness. There is bleeding in the vagina. No erythema or tenderness in the vagina. No foreign body around the vagina. No signs of injury around the vagina. No vaginal discharge found.   Results for orders placed or performed during the hospital encounter of 01/02/15 (from the past 24 hour(s))  Urinalysis, Routine w reflex microscopic     Status: Abnormal   Collection Time: 01/02/15 11:15 AM  Result Value Ref Range   Color, Urine YELLOW YELLOW    APPearance CLEAR CLEAR   Specific Gravity, Urine 1.015 1.005 - 1.030   pH 6.0 5.0 - 8.0   Glucose, UA NEGATIVE NEGATIVE mg/dL   Hgb urine dipstick LARGE (A) NEGATIVE   Bilirubin Urine NEGATIVE NEGATIVE   Ketones, ur NEGATIVE NEGATIVE mg/dL   Protein, ur NEGATIVE NEGATIVE mg/dL   Urobilinogen, UA 0.2 0.0 - 1.0 mg/dL   Nitrite NEGATIVE NEGATIVE   Leukocytes, UA NEGATIVE NEGATIVE  Urine microscopic-add on     Status: None   Collection Time: 01/02/15 11:15 AM  Result Value Ref Range   Squamous Epithelial / LPF RARE RARE   RBC / HPF 21-50 <3 RBC/hpf  Pregnancy, urine POC     Status: None   Collection Time: 01/02/15 11:18 AM  Result Value Ref Range   Preg Test, Ur NEGATIVE NEGATIVE  hCG, quantitative, pregnancy     Status: None   Collection Time: 01/02/15 12:08 PM  Result Value Ref Range   hCG, Beta Chain, Quant, S 1 <5 mIU/mL  CBC     Status: Abnormal   Collection Time: 01/02/15 12:08 PM  Result Value Ref Range   WBC 4.9 4.0 - 10.5 K/uL   RBC 4.10 3.87 - 5.11 MIL/uL   Hemoglobin 12.3 12.0 - 15.0 g/dL   HCT 16.135.9 (L) 09.636.0 - 04.546.0 %   MCV 87.6 78.0 - 100.0 fL   MCH 30.0 Raven.0 - 34.0 pg   MCHC 34.3 30.0 - 36.0 g/dL   RDW 40.913.3 81.111.5 - 91.415.5 %   Platelets 326 150 - 400 K/uL    MAU Course  Procedures  MDM CBC Beta Hcg GC Ch Wet prep  Assessment and Plan  Menorrahgia Abnormal Uterine Bleeding  Anaprox DS Follow up in MAU as needed Discharge to home   [facility] HospitalClemmons,Lori Grissett 01/02/2015, 12:24 PM

## 2015-01-02 NOTE — Discharge Instructions (Signed)
Abnormal Uterine Bleeding Abnormal uterine bleeding can affect women at various stages in life, including teenagers, women in their reproductive years, pregnant women, and women who have reached menopause. Several kinds of uterine bleeding are considered abnormal, including:  Bleeding or spotting between periods.   Bleeding after sexual intercourse.   Bleeding that is heavier or more than normal.   Periods that last longer than usual.  Bleeding after menopause.  Many cases of abnormal uterine bleeding are minor and simple to treat, while others are more serious. Any type of abnormal bleeding should be evaluated by your health care provider. Treatment will depend on the cause of the bleeding. HOME CARE INSTRUCTIONS Monitor your condition for any changes. The following actions may help to alleviate any discomfort you are experiencing:  Avoid the use of tampons and douches as directed by your health care provider.  Change your pads frequently. You should get regular pelvic exams and Pap tests. Keep all follow-up appointments for diagnostic tests as directed by your health care provider.  SEEK MEDICAL CARE IF:   Your bleeding lasts more than 1 week.   You feel dizzy at times.  SEEK IMMEDIATE MEDICAL CARE IF:   You pass out.   You are changing pads every 15 to 30 minutes.   You have abdominal pain.  You have a fever.   You become sweaty or weak.   You are passing large blood clots from the vagina.   You start to feel nauseous and vomit. MAKE SURE YOU:   Understand these instructions.  Will watch your condition.  Will get help right away if you are not doing well or get worse. Document Released: 09/08/2005 Document Revised: 09/13/2013 Document Reviewed: 04/07/2013 ExitCare Patient Information 2015 [employer]. This information is not intended to replace advice given to you by your health care provider. Make sure you discuss any questions you have with your  health care provider.  

## 2015-01-02 NOTE — MAU Note (Signed)
Been having real bad cramps, bleeding and clots.  Started 2-3 days ago.  Did not  Have a period last month- did not do a preg. Test.

## 2015-01-03 LAB — GC/CHLAMYDIA PROBE AMP (~~LOC~~) NOT AT ARMC
Chlamydia: POSITIVE — AB
Neisseria Gonorrhea: NEGATIVE

## 2015-01-03 LAB — HIV ANTIBODY (ROUTINE TESTING W REFLEX): HIV Screen 4th Generation wRfx: NONREACTIVE

## 2015-01-04 ENCOUNTER — Telehealth (HOSPITAL_COMMUNITY): Payer: Self-pay | Admitting: *Deleted

## 2015-01-04 DIAGNOSIS — A749 Chlamydial infection, unspecified: Secondary | ICD-10-CM

## 2015-01-04 MED ORDER — AZITHROMYCIN 500 MG PO TABS: ORAL_TABLET | ORAL | Status: DC

## 2015-01-04 NOTE — Telephone Encounter (Signed)
Telephone call to patient regarding positive chlamydia culture, patient notified.  Patient has not been treated and Rx called in per protocol.  Instructed patient to notify her partner for treatment.  Instructed patient to abstain from sex for 7 days post treatment by both parties.  Report faxed to health department.

## 2015-01-26 ENCOUNTER — Ambulatory Visit: Payer: Medicaid Other | Admitting: Obstetrics and Gynecology

## 2015-06-29 ENCOUNTER — Other Ambulatory Visit (HOSPITAL_COMMUNITY)
Admission: RE | Admit: 2015-06-29 | Discharge: 2015-06-29 | Disposition: A | Discharge status: Home / Self Care | Payer: Medicaid Other | Source: Ambulatory Visit | Attending: Orthopedic Surgery | Admitting: Orthopedic Surgery

## 2015-06-29 ENCOUNTER — Encounter: Payer: Self-pay | Admitting: Internal Medicine

## 2015-06-29 ENCOUNTER — Ambulatory Visit (INDEPENDENT_AMBULATORY_CARE_PROVIDER_SITE_OTHER): Payer: Medicaid Other | Admitting: Internal Medicine

## 2015-06-29 VITALS — BP 135/78 | HR 70 | Temp 98.8°F | Ht 67.0 in | Wt 196.0 lb

## 2015-06-29 DIAGNOSIS — I10 Essential (primary) hypertension: Secondary | ICD-10-CM

## 2015-06-29 DIAGNOSIS — Z113 Encounter for screening for infections with a predominantly sexual mode of transmission: Secondary | ICD-10-CM | POA: Diagnosis not present

## 2015-06-29 DIAGNOSIS — R87610 Atypical squamous cells of undetermined significance on cytologic smear of cervix (ASC-US): Secondary | ICD-10-CM

## 2015-06-29 DIAGNOSIS — F411 Generalized anxiety disorder: Secondary | ICD-10-CM | POA: Insufficient documentation

## 2015-06-29 DIAGNOSIS — N760 Acute vaginitis: Secondary | ICD-10-CM | POA: Insufficient documentation

## 2015-06-29 DIAGNOSIS — Z0000 Encounter for general adult medical examination without abnormal findings: Secondary | ICD-10-CM

## 2015-06-29 LAB — POCT URINE PREGNANCY: Preg Test, Ur: NEGATIVE

## 2015-06-29 MED ORDER — CITALOPRAM HYDROBROMIDE 20 MG PO TABS: 20.0000 mg | ORAL_TABLET | Freq: Every day | ORAL | Status: DC

## 2015-06-29 NOTE — Assessment & Plan Note (Signed)
Repeat Pap done 04/04/13 was normal.  Patient may resume normal screening every 3 years with Pap.  At 32, she may start co-testing (pap test and HPV testing) every five years.

## 2015-06-29 NOTE — Progress Notes (Signed)
Patient ID: Raven Webb, female   DOB: 10-21-1985, 29 y.o.   MRN: [id number]   Subjective:   Patient ID: Raven Webb female   DOB: [birth year] 29 y.o.   MRN: [id number]  HPI: [name] is a 29 y.o. female with past medical history of HTN (currently untreated), ASCUS Pap smear (with normal follow up Pap in July 2014), and previous chlamydia infection who presents today for routine follow up as she has not been seen in over 2 years.  Patient also complains today of what she describes as having episodes of feeling closed in where it is difficult for her to catch her breath and she reports dry mouth for the past 6-12 months.  States these episodes last approximately 5 minutes, occur every other week, and she denies any prior history of having these types of symptoms before.  She does not associate these events with anything in particular, stating they occur at random.  She denies any pain, burning, sour taste, or association with particular foods.  She denies any congestion, sinus tenderness, sore throat or cough.  States she does not have a history of allergies or new exposures to potential allergens.  Denies feeling anxious but does report that she feels stressed, particularly regarding finances and her children.  States that her symptoms are relieved by deep breathing and drinking water.  Nothing seems to aggravate the symptoms.   Patient also reports nausea without vomiting that has been occuring during this time.  States the symptoms are worse at night when lying down, making it difficult for her to sleep.  Denies any pain, vomiting, constipation.  Does report occasional diarrhea that is non-bloody, non-mucous. Denies any burning in her throat, denies dysphagia.  Has tried OTC Tums and Pepto without relief.  Had previously been given an Rx for protonix that she states did not help.    Patient also asking for STI testing.  Previous history of chlamydial infection in April that was treated.   Reports 1 new sexual contact since that time that she is unaware of his STI status.  Reports no use of contraception.  Patient states interested in contraception today.  States her LMP was mid-September.    Past Medical History  Diagnosis Date  . Hypertension   . UTI (lower urinary tract infection)    Current Outpatient Prescriptions  Medication Sig Dispense Refill  . citalopram (CELEXA) 20 MG tablet Take 1 tablet (20 mg total) by mouth daily. 30 tablet 2   No current facility-administered medications for this visit.   Family History  Problem Relation Age of Onset  . Hypertension Mother   . Diabetes Mother   . Hypertension Father   . Hypertension Sister   . Diabetes Sister   . Hypertension Maternal Uncle   . Kidney disease Maternal Uncle    Social History   Social History  . Marital Status: Single    Spouse Name: N/A  . Number of Children: N/A  . Years of Education: N/A   Social History Main Topics  . Smoking status: Never Smoker   . Smokeless tobacco: Never Used  . Alcohol Use: No  . Drug Use: No  . Sexual Activity: Not Asked   Other Topics Concern  . None   Social History Narrative   Review of Systems: Review of Systems  Constitutional: Negative for fever and chills.  HENT: Negative for congestion and sore throat.   Eyes: Negative for blurred vision and pain.  Respiratory: Negative for  cough, sputum production and shortness of breath.   Cardiovascular: Positive for palpitations. Negative for chest pain and leg swelling.  Gastrointestinal: Positive for nausea and diarrhea. Negative for heartburn, vomiting, abdominal pain, constipation and blood in stool.  Genitourinary: Negative for dysuria, frequency and flank pain.  Musculoskeletal: Negative for myalgias, back pain and joint pain.  Skin: Negative for rash.  Neurological: Negative for dizziness and headaches.  Psychiatric/Behavioral: Negative for suicidal ideas and substance abuse.     Objective:    Physical Exam: Filed Vitals:   06/29/15 0934  BP: 135/78  Pulse: 70  Temp: 98.8 F (37.1 C)  TempSrc: Oral  Height:  (1.702 m)  Weight: 196 lb (88.905 kg)  SpO2: 100%   Physical Exam  Constitutional: She is oriented to person, place, and time. She appears well-developed and well-nourished. No distress.  HENT:  Head: Normocephalic and atraumatic.  Eyes: Conjunctivae and EOM are normal.  Neck: Normal range of motion. Neck supple.  Cardiovascular: Normal rate, regular rhythm, normal heart sounds and intact distal pulses.   Pulmonary/Chest: Effort normal and breath sounds normal.  Abdominal: Soft. Bowel sounds are normal. There is no tenderness.  Musculoskeletal: Normal range of motion. She exhibits no edema.  Lymphadenopathy:    She has no cervical adenopathy.  Neurological: She is alert and oriented to person, place, and time.  Skin: Skin is warm and dry.  Psychiatric: She has a normal mood and affect. Her behavior is normal.    Assessment & Plan:   Please see Problem List for Assessment and Plan.

## 2015-06-29 NOTE — Progress Notes (Signed)
Internal Medicine Clinic Attending  I saw and evaluated the patient.  I personally confirmed the key portions of the history and exam documented by Dr. Earlene Plater and I reviewed pertinent patient test results.  The assessment, diagnosis, and plan were formulated together and I agree with the documentation in the resident's note.  Clinical course consistent with generalized anxiety disorder so after discussion with the patient we will treat with SSRI and follow up in 4 weeks. Evaluation for STI and advised contraception.

## 2015-06-29 NOTE — Assessment & Plan Note (Signed)
BP Readings from Last 3 Encounters:  06/29/15 135/78  01/02/15 136/87  09/08/13 137/87       Component Value Date/Time   NA 137 02/24/2013 1353   K 4.1 02/24/2013 1353   CREATININE 1.03 02/24/2013 1353   CREATININE 0.84 12/17/2011 1533    Assessment:  Patient with HTN non-compliant with 1 class (diuretic) anti-hypertensive therapy who presents today with blood pressure of 135/78.  Denies headaches, chest pain, edema, dizziness or lightheadedness  Blood pressure control: controlled Progress towards BP goal: at goal Comments: with BP <140/90 now on two separate measurements (today and April 2016), will not restart HCTZ as patient did not like being on BP meds.  Plan:   -Medication changes: discontinue HCTZ as patient at goal off of BP meds for >6 months  -Labs: BMP -Other plans: none  -Encourage regular exercise and healthy diet (decreased salt intake) especially off BP meds -Close monitoring of BP, if elevated in the future will restart HCTZ.  Medications after today's visit: none

## 2015-06-29 NOTE — Assessment & Plan Note (Signed)
Assessment: Patient requesting STI testing given new sexual partner.  No current contraceptive or STI prevention use.  LMP mid-September.   Denies any dysuria, discharge, abnormal bleeding, pelvic pain.  Patient interested in contraception, previously used OCPs and Mirena.  Does not want to use either again.  INterested in Nexplanon. Patient declines flu shot.  Plan: -urine pregnancy negative -urine GC/CT -referral to Ob/Gyn for Nexplanon -HIV, RPR

## 2015-06-29 NOTE — Assessment & Plan Note (Signed)
Assessment: Patient complains today of what she describes as having episodes of feeling closed in where it is difficult for her to catch her breath and she reports dry mouth for the past 6-12 months.  States these episodes last approximately 5 minutes, occur every other week, and she denies any prior history of having these types of symptoms before.  She does not associate these events with anything in particular, stating they occur at random.  She denies any pain, burning, sour taste, or association with particular foods.  She denies any congestion, sinus tenderness, sore throat or cough.  States she does not have a history of allergies or new exposures to potential allergens.  Denies feeling anxious but does report that she feels stressed, particularly regarding finances and her children.  States that her symptoms are relieved by deep breathing and drinking water.  Nothing seems to aggravate the symptoms.   Plan: -GAD-7 scale score of 16, stating it is very difficult for her do her work, take care of things at home, or get along well with other people -start Citalopram  daily -RTC in 4 weeks to assess improvement on Citalopram

## 2015-06-29 NOTE — Patient Instructions (Signed)
It was nice to meet you today! A prescription has been sent to your pharmacy.  Please return to see [place] in 4 weeks.  Citalopram tablets What is this medicine? CITALOPRAM (sye TAL oh pram) is a medicine for depression. This medicine may be used for other purposes; ask your health care provider or pharmacist if you have questions. What should I tell my health care provider before I take this medicine? They need to know if you have any of these conditions: -bipolar disorder or a family history of bipolar disorder -diabetes -glaucoma -heart disease -history of irregular heartbeat -kidney or liver disease -low levels of magnesium or potassium in the blood -receiving electroconvulsive therapy -seizures (convulsions) -suicidal thoughts or a previous suicide attempt -an unusual or allergic reaction to citalopram, escitalopram, other medicines, foods, dyes, or preservatives -pregnant or trying to become pregnant -breast-feeding How should I use this medicine? Take this medicine by mouth with a glass of water. Follow the directions on the prescription label. You can take it with or without food. Take your medicine at regular intervals. Do not take your medicine more often than directed. Do not stop taking this medicine suddenly except upon the advice of your doctor. Stopping this medicine too quickly may cause serious side effects or your condition may worsen. A special MedGuide will be given to you by the pharmacist with each prescription and refill. Be sure to read this information carefully each time. Talk to your pediatrician regarding the use of this medicine in children. Special care may be needed. Patients over 45 years old may have a stronger reaction and need a smaller dose. Overdosage: If you think you have taken too much of this medicine contact a poison control center or emergency room at once. NOTE: This medicine is only for you. Do not share this medicine with others. What if I miss  a dose? If you miss a dose, take it as soon as you can. If it is almost time for your next dose, take only that dose. Do not take double or extra doses. What may interact with this medicine? Do not take this medicine with any of the following medications: -certain medicines for fungal infections like fluconazole, itraconazole, ketoconazole, posaconazole, voriconazole -cisapride -dofetilide -dronedarone -escitalopram -linezolid -MAOIs like Carbex, Eldepryl, Marplan, Nardil, and Parnate -methylene blue (injected into a vein) -pimozide -thioridazine -ziprasidone This medicine may also interact with the following medications: -alcohol -aspirin and aspirin-like medicines -carbamazepine -certain medicines for depression, anxiety, or psychotic disturbances -certain medicines for infections like chloroquine, clarithromycin, erythromycin, furazolidone, isoniazid, pentamidine -certain medicines for migraine headaches like almotriptan, eletriptan, frovatriptan, naratriptan, rizatriptan, sumatriptan, zolmitriptan -certain medicines for sleep -certain medicines that treat or prevent blood clots like dalteparin, enoxaparin, warfarin -cimetidine -diuretics -fentanyl -lithium -methadone -metoprolol -NSAIDs, medicines for pain and inflammation, like ibuprofen or naproxen -omeprazole -other medicines that prolong the QT interval (cause an abnormal heart rhythm) -procarbazine -rasagiline -supplements like St. John's wort, kava kava, valerian -tramadol -tryptophan This list may not describe all possible interactions. Give your health care provider a list of all the medicines, herbs, non-prescription drugs, or dietary supplements you use. Also tell them if you smoke, drink alcohol, or use illegal drugs. Some items may interact with your medicine. What should I watch for while using this medicine? Tell your doctor if your symptoms do not get better or if they get worse. Visit your doctor or health  care professional for regular checks on your progress. Because it may take several weeks to see  the full effects of this medicine, it is important to continue your treatment as prescribed by your doctor. Patients and their families should watch out for new or worsening thoughts of suicide or depression. Also watch out for sudden changes in feelings such as feeling anxious, agitated, panicky, irritable, hostile, aggressive, impulsive, severely restless, overly excited and hyperactive, or not being able to sleep. If this happens, especially at the beginning of treatment or after a change in dose, call your health care professional. Bonita Quin may get drowsy or dizzy. Do not drive, use machinery, or do anything that needs mental alertness until you know how this medicine affects you. Do not stand or sit up quickly, especially if you are an older patient. This reduces the risk of dizzy or fainting spells. Alcohol may interfere with the effect of this medicine. Avoid alcoholic drinks. Your mouth may get dry. Chewing sugarless gum or sucking hard candy, and drinking plenty of water will help. Contact your doctor if the problem does not go away or is severe. What side effects may I notice from receiving this medicine? Side effects that you should report to your doctor or health care professional as soon as possible: -allergic reactions like skin rash, itching or hives, swelling of the face, lips, or tongue -chest pain -confusion -dizziness -fast, irregular heartbeat -fast talking and excited feelings or actions that are out of control -feeling faint or lightheaded, falls -hallucination, loss of contact with reality -seizures -shortness of breath -suicidal thoughts or other mood changes -unusual bleeding or bruising Side effects that usually do not require medical attention (report to your doctor or health care professional if they continue or are bothersome): -blurred vision -change in appetite -change in sex  drive or performance -headache -increased sweating -nausea -trouble sleeping This list may not describe all possible side effects. Call your doctor for medical advice about side effects. You may report side effects to FDA at 1-800-FDA-1088. Where should I keep my medicine? Keep out of reach of children. Store at room temperature between 15 and 30 degrees C (59 and 86 degrees F). Throw away any unused medicine after the expiration date. NOTE: This sheet is a summary. It may not cover all possible information. If you have questions about this medicine, talk to your doctor, pharmacist, or health care provider.    2016, [employer]. (2013-04-01 13:19:48)

## 2015-06-30 LAB — BMP8+ANION GAP
Anion Gap: 15 mmol/L (ref 10.0–18.0)
BUN/Creatinine Ratio: 9 (ref 8–20)
BUN: 7 mg/dL (ref 6–20)
CHLORIDE: 100 mmol/L (ref 97–108)
CO2: 23 mmol/L (ref 18–29)
Calcium: 9.3 mg/dL (ref 8.7–10.2)
Creatinine, Ser: 0.81 mg/dL (ref 0.57–1.00)
GFR calc non Af Amer: 100 mL/min/{1.73_m2} (ref >59)
GFR, EST AFRICAN AMERICAN: 115 mL/min/{1.73_m2} (ref >59)
GLUCOSE: 105 mg/dL — AB (ref 65–99)
Potassium: 4.3 mmol/L (ref 3.5–5.2)
SODIUM: 138 mmol/L (ref 134–144)

## 2015-06-30 LAB — HIV ANTIBODY (ROUTINE TESTING W REFLEX): HIV SCREEN 4TH GENERATION: NONREACTIVE

## 2015-06-30 LAB — RPR: RPR: NONREACTIVE

## 2015-07-02 ENCOUNTER — Encounter: Payer: Self-pay | Admitting: Internal Medicine

## 2015-07-02 ENCOUNTER — Telehealth: Payer: Self-pay | Admitting: Internal Medicine

## 2015-07-02 LAB — URINE CYTOLOGY ANCILLARY ONLY
Chlamydia: NEGATIVE
Neisseria Gonorrhea: NEGATIVE
Trichomonas: NEGATIVE

## 2015-07-02 NOTE — Telephone Encounter (Signed)
Talked with pt about labs per Dr Earlene Plater - HIV was non reactive, UCG was neg and RPR was non reactive. BMP8 was WNL per Dr Earlene Plater.  Urine cytology was still pending. Stanton Kidney Michelina Mexicano RN 07/02/15 1:45PM

## 2015-07-02 NOTE — Telephone Encounter (Signed)
Pt aware Dr Earlene Plater will call pt with lab results. Stanton Kidney Haruye Lainez RN 07/02/15 10:14AM

## 2015-07-02 NOTE — Telephone Encounter (Signed)
Pt called for lab result. Please call pt back.

## 2015-07-02 NOTE — Telephone Encounter (Signed)
Spoke to patient regarding labwork.  HIV and RPR non-reactive.  Urine GC/CT negative.

## 2015-07-03 ENCOUNTER — Encounter: Payer: Self-pay | Admitting: Family Medicine

## 2015-07-20 ENCOUNTER — Ambulatory Visit: Payer: Medicaid Other | Admitting: Family Medicine

## 2015-07-23 ENCOUNTER — Ambulatory Visit: Payer: Medicaid Other | Admitting: Internal Medicine

## 2015-08-13 ENCOUNTER — Ambulatory Visit: Payer: Medicaid Other | Admitting: Obstetrics & Gynecology

## 2015-08-21 NOTE — Addendum Note (Signed)
Addended by: Dorie RankPOWERS, Amyrie Illingworth E on: 08/21/2015 11:15 AM   Modules accepted: Orders

## 2015-09-27 ENCOUNTER — Ambulatory Visit: Payer: Medicaid Other | Admitting: Internal Medicine

## 2015-10-06 ENCOUNTER — Inpatient Hospital Stay (HOSPITAL_COMMUNITY)
Admission: AD | Admit: 2015-10-06 | Discharge: 2015-10-06 | Disposition: A | Discharge status: Home / Self Care | Payer: Medicaid Other | Source: Ambulatory Visit | Attending: Obstetrics & Gynecology | Admitting: Obstetrics & Gynecology

## 2015-10-06 ENCOUNTER — Inpatient Hospital Stay (HOSPITAL_COMMUNITY): Payer: Medicaid Other

## 2015-10-06 ENCOUNTER — Encounter (HOSPITAL_COMMUNITY): Payer: Self-pay | Admitting: *Deleted

## 2015-10-06 DIAGNOSIS — Z3A01 Less than 8 weeks gestation of pregnancy: Secondary | ICD-10-CM | POA: Insufficient documentation

## 2015-10-06 DIAGNOSIS — A5901 Trichomonal vulvovaginitis: Secondary | ICD-10-CM

## 2015-10-06 DIAGNOSIS — O98311 Other infections with a predominantly sexual mode of transmission complicating pregnancy, first trimester: Secondary | ICD-10-CM | POA: Diagnosis not present

## 2015-10-06 DIAGNOSIS — O26891 Other specified pregnancy related conditions, first trimester: Secondary | ICD-10-CM | POA: Diagnosis present

## 2015-10-06 DIAGNOSIS — O23591 Infection of other part of genital tract in pregnancy, first trimester: Secondary | ICD-10-CM | POA: Diagnosis not present

## 2015-10-06 DIAGNOSIS — R109 Unspecified abdominal pain: Secondary | ICD-10-CM

## 2015-10-06 DIAGNOSIS — O26899 Other specified pregnancy related conditions, unspecified trimester: Secondary | ICD-10-CM

## 2015-10-06 LAB — CBC
HCT: 35.9 % — ABNORMAL LOW (ref 36.0–46.0)
Hemoglobin: 12.3 g/dL (ref 12.0–15.0)
MCH: 29.9 pg (ref 26.0–34.0)
MCHC: 34.3 g/dL (ref 30.0–36.0)
MCV: 87.1 fL (ref 78.0–100.0)
PLATELETS: 367 10*3/uL (ref 150–400)
RBC: 4.12 MIL/uL (ref 3.87–5.11)
RDW: 13.6 % (ref 11.5–15.5)
WBC: 7.6 10*3/uL (ref 4.0–10.5)

## 2015-10-06 LAB — URINALYSIS, ROUTINE W REFLEX MICROSCOPIC
Bilirubin Urine: NEGATIVE
GLUCOSE, UA: NEGATIVE mg/dL
HGB URINE DIPSTICK: NEGATIVE
KETONES UR: 15 mg/dL — AB
Leukocytes, UA: NEGATIVE
Nitrite: NEGATIVE
Protein, ur: NEGATIVE mg/dL
Specific Gravity, Urine: 1.025 (ref 1.005–1.030)
pH: 7 (ref 5.0–8.0)

## 2015-10-06 LAB — WET PREP, GENITAL
CLUE CELLS WET PREP: NONE SEEN
Sperm: NONE SEEN
YEAST WET PREP: NONE SEEN

## 2015-10-06 LAB — HCG, QUANTITATIVE, PREGNANCY: hCG, Beta Chain, Quant, S: 7506 m[IU]/mL — ABNORMAL HIGH (ref <5)

## 2015-10-06 LAB — POCT PREGNANCY, URINE: Preg Test, Ur: POSITIVE — AB

## 2015-10-06 MED ORDER — METRONIDAZOLE 500 MG PO TABS
2000.0000 mg | ORAL_TABLET | Freq: Once | ORAL | Status: AC
  Administered 2015-10-06: 2000 mg via ORAL
  Filled 2015-10-06: qty 4

## 2015-10-06 NOTE — MAU Note (Signed)
Pt reports she has been having abd pain and cramping and leg pain for several weeks . LMP 09/02/15. C/o brown vag discharge as well.

## 2015-10-06 NOTE — MAU Provider Note (Signed)
History     CSN: [id number]  Arrival date and time: 10/06/15 2009   First Provider Initiated Contact with Patient 10/06/15 2038         Chief Complaint  Patient presents with  . Abdominal Cramping   Raven Webb is a 30 y.o. [phone] at [redacted]w[redacted]d who presents for abdominal cramping.   Abdominal Cramping This is a new problem. The current episode started yesterday. The problem occurs intermittently. The problem has been unchanged. The pain is located in the suprapubic region. The patient is experiencing no pain (not hurting during this visit). The quality of the pain is cramping. The abdominal pain does not radiate. Pertinent negatives include no constipation, diarrhea, dysuria, fever, nausea or vomiting. Nothing aggravates the pain. She has tried nothing for the symptoms.     OB History    Gravida Para Term Preterm AB TAB SAB Ectopic Multiple Living   4 3 1 2      3       Past Medical History  Diagnosis Date  . Hypertension   . UTI (lower urinary tract infection)     Past Surgical History  Procedure Laterality Date  . Wisdom tooth extraction      Family History  Problem Relation Age of Onset  . Hypertension Mother   . Diabetes Mother   . Hypertension Father   . Hypertension Sister   . Diabetes Sister   . Hypertension Maternal Uncle   . Kidney disease Maternal Uncle     Social History  Substance Use Topics  . Smoking status: Never Smoker   . Smokeless tobacco: Never Used  . Alcohol Use: No    Allergies: No Known Allergies  Prescriptions prior to admission  Medication Sig Dispense Refill Last Dose  . citalopram (CELEXA) 20 MG tablet Take 1 tablet (20 mg total) by mouth daily. 30 tablet 2     Review of Systems  Constitutional: Negative.  Negative for fever.  Gastrointestinal: Positive for abdominal pain. Negative for nausea, vomiting, diarrhea and constipation.  Genitourinary: Negative.  Negative for dysuria.       No vaginal bleeding or discharge   Musculoskeletal: Positive for back pain (low back pain, cramp like).   Physical Exam   Blood pressure 159/96, pulse 79, temperature 99.1 F (37.3 C), temperature source Oral, resp. rate 18, height 5\' 7"  (1.702 m), weight 197 lb 6.4 oz (89.54 kg), last menstrual period 09/02/2015.  Physical Exam  Nursing note and vitals reviewed. Constitutional: She is oriented to person, place, and time. She appears well-developed and well-nourished. No distress.  HENT:  Head: Normocephalic and atraumatic.  Eyes: Conjunctivae are normal. Right eye exhibits no discharge. Left eye exhibits no discharge. No scleral icterus.  Neck: Normal range of motion.  Cardiovascular: Normal rate, regular rhythm and normal heart sounds.   No murmur heard. Respiratory: Effort normal and breath sounds normal. No respiratory distress. She has no wheezes.  GI: Soft. Bowel sounds are normal. She exhibits no distension. There is no tenderness.  Genitourinary: Vagina normal and uterus normal. Cervix exhibits discharge (Small amount of yellow mucoid discharge) and friability. Cervix exhibits no motion tenderness. Right adnexum displays no mass and no tenderness. Left adnexum displays no mass and no tenderness.  Neurological: She is alert and oriented to person, place, and time.  Skin: Skin is warm and dry. She is not diaphoretic.  Psychiatric: She has a normal mood and affect. Her behavior is normal. Judgment and thought content normal.    MAU  Course  Procedures Results for orders placed or performed during the hospital encounter of 10/06/15 (from the past 24 hour(s))  Urinalysis, Routine w reflex microscopic ([facility])     Status: Abnormal   Collection Time: 10/06/15  8:20 PM  Result Value Ref Range   Color, Urine YELLOW YELLOW   APPearance CLEAR CLEAR   Specific Gravity, Urine 1.025 1.005 - 1.030   pH 7.0 5.0 - 8.0   Glucose, UA NEGATIVE NEGATIVE mg/dL   Hgb urine dipstick NEGATIVE NEGATIVE   Bilirubin Urine  NEGATIVE NEGATIVE   Ketones, ur 15 (A) NEGATIVE mg/dL   Protein, ur NEGATIVE NEGATIVE mg/dL   Nitrite NEGATIVE NEGATIVE   Leukocytes, UA NEGATIVE NEGATIVE  Pregnancy, urine POC     Status: Abnormal   Collection Time: 10/06/15  8:29 PM  Result Value Ref Range   Preg Test, Ur POSITIVE (A) NEGATIVE  CBC     Status: Abnormal   Collection Time: 10/06/15  8:50 PM  Result Value Ref Range   WBC 7.6 4.0 - 10.5 K/uL   RBC 4.12 3.87 - 5.11 MIL/uL   Hemoglobin 12.3 12.0 - 15.0 g/dL   HCT 78.235.9 (L) 95.636.0 - 21.346.0 %   MCV 87.1 78.0 - 100.0 fL   MCH 29.9 26.0 - 34.0 pg   MCHC 34.3 30.0 - 36.0 g/dL   RDW 08.613.6 57.811.5 - 46.915.5 %   Platelets 367 150 - 400 K/uL  hCG, quantitative, pregnancy     Status: Abnormal   Collection Time: 10/06/15  8:50 PM  Result Value Ref Range   hCG, Beta Chain, Quant, S 7506 (H) <5 mIU/mL  Wet prep, genital     Status: Abnormal   Collection Time: 10/06/15  9:05 PM  Result Value Ref Range   Yeast Wet Prep HPF POC NONE SEEN NONE SEEN   Trich, Wet Prep PRESENT (A) NONE SEEN   Clue Cells Wet Prep HPF POC NONE SEEN NONE SEEN   WBC, Wet Prep HPF POC MODERATE (A) NONE SEEN   Sperm NONE SEEN    [place] Ob Comp Less 14 Wks  10/06/2015  CLINICAL DATA:  Acute onset of generalized abdominal pain. Initial encounter. EXAM: OBSTETRIC <14 WK US AND TRANSVAGINAL OB US TECHNIQUE: Both transabdominal and transvaginal ultrasound examinations were performed for complete evaluation of the gestation as well as the maternal uterus, adnexal regions, and pelvic cul-de-sac. Transvaginal technique was performed to assess early pregnancy. COMPARISON:  CT of the abdomen and pelvis performed 12/17/2011 FINDINGS: Intrauterine gestational sac: Visualized/normal in shape. Yolk sac:  Yes Embryo:  No MSD: 9.5  mm   5 w   5  d Subchorionic hemorrhage:  None visualized. Maternal uterus/adnexae: A small 1.2 cm intramural fibroid is noted at the right side of the lower uterine segment. The uterus is otherwise unremarkable.  The ovaries are within normal limits. The right ovary measures 3.3 x 1.6 x 2.3 cm, while the left ovary measures 3.0 x 1.8 x 1.8 cm. No suspicious adnexal masses are seen; there is no evidence for ovarian torsion. No free fluid is seen within the pelvic cul-de-sac. IMPRESSION: 1. Single intrauterine gestational sac noted, with a mean sac diameter of 1.0 cm, corresponding to a gestational age of [redacted] weeks 5 days. This matches the gestational age of [redacted] weeks 6 days by LMP, reflecting an estimated date of delivery of June 08, 2016. A yolk sac is visualized. The embryo is not yet seen, within normal limits. 2. Small 1.2 cm intramural  fibroid incidentally noted at the right side of the lower uterine segment. Uterus otherwise unremarkable. Electronically Signed   By: Roanna Raider M.D.   On: 10/06/2015 21:56   US Ob Transvaginal  10/06/2015  CLINICAL DATA:  Acute onset of generalized abdominal pain. Initial encounter. EXAM: OBSTETRIC <14 WK [place] AND TRANSVAGINAL OB US TECHNIQUE: Both transabdominal and transvaginal ultrasound examinations were performed for complete evaluation of the gestation as well as the maternal uterus, adnexal regions, and pelvic cul-de-sac. Transvaginal technique was performed to assess early pregnancy. COMPARISON:  CT of the abdomen and pelvis performed 12/17/2011 FINDINGS: Intrauterine gestational sac: Visualized/normal in shape. Yolk sac:  Yes Embryo:  No MSD: 9.5  mm   5 w   5  d Subchorionic hemorrhage:  None visualized. Maternal uterus/adnexae: A small 1.2 cm intramural fibroid is noted at the right side of the lower uterine segment. The uterus is otherwise unremarkable. The ovaries are within normal limits. The right ovary measures 3.3 x 1.6 x 2.3 cm, while the left ovary measures 3.0 x 1.8 x 1.8 cm. No suspicious adnexal masses are seen; there is no evidence for ovarian torsion. No free fluid is seen within the pelvic cul-de-sac. IMPRESSION: 1. Single intrauterine gestational sac noted,  with a mean sac diameter of 1.0 cm, corresponding to a gestational age of [redacted] weeks 5 days. This matches the gestational age of [redacted] weeks 6 days by LMP, reflecting an estimated date of delivery of June 08, 2016. A yolk sac is visualized. The embryo is not yet seen, within normal limits. 2. Small 1.2 cm intramural fibroid incidentally noted at the right side of the lower uterine segment. Uterus otherwise unremarkable. Electronically Signed   By: Roanna Raider M.D.   On: 10/06/2015 21:56    MDM UPT positive O negative - no bleeding Ultrasound shows IUGS with yolk sac Wet prep shows trich. Flagyl 2 gm po given in MAU. Discussed partner treatment with patient.  Assessment and Plan  A: 1. Abdominal pain in pregnancy   2. Trichomonal vaginitis during pregnancy in first trimester    P: Discharge home GC/CT, HIV, RPR pending Call clinic to start prenatal care Discussed reasons to return  Expedited partner treatment rx & info given No intercourse x 1 weeks after BOTH partner & pt have been treated  Judeth Horn, NP  10/06/2015, 8:38 PM

## 2015-10-06 NOTE — Discharge Instructions (Signed)
Trichomoniasis °Trichomoniasis is an infection caused by an organism called Trichomonas. The infection can affect both women and men. In women, the outer female genitalia and the vagina are affected. In men, the penis is mainly affected, but the prostate and other reproductive organs can also be involved. Trichomoniasis is a sexually transmitted infection (STI) and is most often passed to another person through sexual contact.  °RISK FACTORS °· Having unprotected sexual intercourse. °· Having sexual intercourse with an infected partner. °SIGNS AND SYMPTOMS  °Symptoms of trichomoniasis in women include: °· Abnormal gray-green frothy vaginal discharge. °· Itching and irritation of the vagina. °· Itching and irritation of the area outside the vagina. °Symptoms of trichomoniasis in men include:  °· Penile discharge with or without pain. °· Pain during urination. This results from inflammation of the urethra. °DIAGNOSIS  °Trichomoniasis may be found during a Pap test or physical exam. Your health care provider may use one of the following methods to help diagnose this infection: °· Testing the pH of the vagina with a test tape. °· Using a vaginal swab test that checks for the Trichomonas organism. A test is available that provides results within a few minutes. °· Examining a urine sample. °· Testing vaginal secretions. °Your health care provider may test you for other STIs, including HIV. °TREATMENT  °· You may be given medicine to fight the infection. Women should inform their health care provider if they could be or are pregnant. Some medicines used to treat the infection should not be taken during pregnancy. °· Your health care provider may recommend over-the-counter medicines or creams to decrease itching or irritation. °· Your sexual partner will need to be treated if infected. °· Your health care provider may test you for infection again 3 months after treatment. °HOME CARE INSTRUCTIONS  °· Take medicines only as  directed by your health care provider. °· Take over-the-counter medicine for itching or irritation as directed by your health care provider. °· Do not have sexual intercourse while you have the infection. °· Women should not douche or wear tampons while they have the infection. °· Discuss your infection with your partner. Your partner may have gotten the infection from you, or you may have gotten it from your partner. °· Have your sex partner get examined and treated if necessary. °· Practice safe, informed, and protected sex. °· See your health care provider for other STI testing. °SEEK MEDICAL CARE IF:  °· You still have symptoms after you finish your medicine. °· You develop abdominal pain. °· You have pain when you urinate. °· You have bleeding after sexual intercourse. °· You develop a rash. °· Your medicine makes you sick or makes you throw up (vomit). °MAKE SURE YOU: °· Understand these instructions. °· Will watch your condition. °· Will get help right away if you are not doing well or get worse. °  °This information is not intended to replace advice given to you by your health care provider. Make sure you discuss any questions you have with your health care provider. °  °Document Released: 03/04/2001 Document Revised: 09/29/2014 Document Reviewed: 06/20/2013 °Elsevier Interactive Patient Education ©2016 [employer] ° °First Trimester of Pregnancy °The first trimester of pregnancy is from week 1 until the end of week 12 (months 1 through 3). A week after a sperm fertilizes an egg, the egg will implant on the wall of the uterus. This embryo will begin to develop into a baby. Genes from you and your partner are forming the   baby. The female genes determine whether the baby is a boy or a girl. At 6-8 weeks, the eyes and face are formed, and the heartbeat can be seen on ultrasound. At the end of 12 weeks, all the baby's organs are formed.  °Now that you are pregnant, you will want to do everything you can to have  a healthy baby. Two of the most important things are to get good prenatal care and to follow your health care provider's instructions. Prenatal care is all the medical care you receive before the baby's birth. This care will help prevent, find, and treat any problems during the pregnancy and childbirth. °BODY CHANGES °Your body goes through many changes during pregnancy. The changes vary from woman to woman.  °· You may gain or lose a couple of pounds at first. °· You may feel sick to your stomach (nauseous) and throw up (vomit). If the vomiting is uncontrollable, call your health care provider. °· You may tire easily. °· You may develop headaches that can be relieved by medicines approved by your health care provider. °· You may urinate more often. Painful urination may mean you have a bladder infection. °· You may develop heartburn as a result of your pregnancy. °· You may develop constipation because certain hormones are causing the muscles that push waste through your intestines to slow down. °· You may develop hemorrhoids or swollen, bulging veins (varicose veins). °· Your breasts may begin to grow larger and become tender. Your nipples may stick out more, and the tissue that surrounds them (areola) may become darker. °· Your gums may bleed and may be sensitive to brushing and flossing. °· Dark spots or blotches (chloasma, mask of pregnancy) may develop on your face. This will likely fade after the baby is born. °· Your menstrual periods will stop. °· You may have a loss of appetite. °· You may develop cravings for certain kinds of food. °· You may have changes in your emotions from day to day, such as being excited to be pregnant or being concerned that something may go wrong with the pregnancy and baby. °· You may have more vivid and strange dreams. °· You may have changes in your hair. These can include thickening of your hair, rapid growth, and changes in texture. Some women also have hair loss during or  after pregnancy, or hair that feels dry or thin. Your hair will most likely return to normal after your baby is born. °WHAT TO EXPECT AT YOUR PRENATAL VISITS °During a routine prenatal visit: °· You will be weighed to make sure you and the baby are growing normally. °· Your blood pressure will be taken. °· Your abdomen will be measured to track your baby's growth. °· The fetal heartbeat will be listened to starting around week 10 or 12 of your pregnancy. °· Test results from any previous visits will be discussed. °Your health care provider may ask you: °· How you are feeling. °· If you are feeling the baby move. °· If you have had any abnormal symptoms, such as leaking fluid, bleeding, severe headaches, or abdominal cramping. °· If you are using any tobacco products, including cigarettes, chewing tobacco, and electronic cigarettes. °· If you have any questions. °Other tests that may be performed during your first trimester include: °· Blood tests to find your blood type and to check for the presence of any previous infections. They will also be used to check for low iron levels (anemia) and Rh antibodies. Later   in the pregnancy, blood tests for diabetes will be done along with other tests if problems develop. °· Urine tests to check for infections, diabetes, or protein in the urine. °· An ultrasound to confirm the proper growth and development of the baby. °· An amniocentesis to check for possible genetic problems. °· Fetal screens for spina bifida and Down syndrome. °· You may need other tests to make sure you and the baby are doing well. °· HIV (human immunodeficiency virus) testing. Routine prenatal testing includes screening for HIV, unless you choose not to have this test. °HOME CARE INSTRUCTIONS  °Medicines °· Follow your health care provider's instructions regarding medicine use. Specific medicines may be either safe or unsafe to take during pregnancy. °· Take your prenatal vitamins as directed. °· If you  develop constipation, try taking a stool softener if your health care provider approves. °Diet °· Eat regular, well-balanced meals. Choose a variety of foods, such as meat or vegetable-based protein, fish, milk and low-fat dairy products, vegetables, fruits, and whole grain breads and cereals. Your health care provider will help you determine the amount of weight gain that is right for you. °· Avoid raw meat and uncooked cheese. These carry germs that can cause birth defects in the baby. °· Eating four or five small meals rather than three large meals a day may help relieve nausea and vomiting. If you start to feel nauseous, eating a few soda crackers can be helpful. Drinking liquids between meals instead of during meals also seems to help nausea and vomiting. °· If you develop constipation, eat more high-fiber foods, such as fresh vegetables or fruit and whole grains. Drink enough fluids to keep your urine clear or pale yellow. °Activity and Exercise °· Exercise only as directed by your health care provider. Exercising will help you: °¨ Control your weight. °¨ Stay in shape. °¨ Be prepared for labor and delivery. °· Experiencing pain or cramping in the lower abdomen or low back is a good sign that you should stop exercising. Check with your health care provider before continuing normal exercises. °· Try to avoid standing for long periods of time. Move your legs often if you must stand in one place for a long time. °· Avoid heavy lifting. °· Wear low-heeled shoes, and practice good posture. °· You may continue to have sex unless your health care provider directs you otherwise. °Relief of Pain or Discomfort °· Wear a good support bra for breast tenderness.   °· Take warm sitz baths to soothe any pain or discomfort caused by hemorrhoids. Use hemorrhoid cream if your health care provider approves.   °· Rest with your legs elevated if you have leg cramps or low back pain. °· If you develop varicose veins in your legs,  wear support hose. Elevate your feet for 15 minutes, 3-4 times a day. Limit salt in your diet. °Prenatal Care °· Schedule your prenatal visits by the twelfth week of pregnancy. They are usually scheduled monthly at first, then more often in the last 2 months before delivery. °· Write down your questions. Take them to your prenatal visits. °· Keep all your prenatal visits as directed by your health care provider. °Safety °· Wear your seat belt at all times when driving. °· Make a list of emergency phone numbers, including numbers for family, friends, the hospital, and police and fire departments. °General Tips °· Ask your health care provider for a referral to a local prenatal education class. Begin classes no later than at the   beginning of month 6 of your pregnancy. °· Ask for help if you have counseling or nutritional needs during pregnancy. Your health care provider can offer advice or refer you to specialists for help with various needs. °· Do not use hot tubs, steam rooms, or saunas. °· Do not douche or use tampons or scented sanitary pads. °· Do not cross your legs for long periods of time. °· Avoid cat litter boxes and soil used by cats. These carry germs that can cause birth defects in the baby and possibly loss of the fetus by miscarriage or stillbirth. °· Avoid all smoking, herbs, alcohol, and medicines not prescribed by your health care provider. Chemicals in these affect the formation and growth of the baby. °· Do not use any tobacco products, including cigarettes, chewing tobacco, and electronic cigarettes. If you need help quitting, ask your health care provider. You may receive counseling support and other resources to help you quit. °· Schedule a dentist appointment. At home, brush your teeth with a soft toothbrush and be gentle when you floss. °SEEK MEDICAL CARE IF:  °· You have dizziness. °· You have mild pelvic cramps, pelvic pressure, or nagging pain in the abdominal area. °· You have persistent  nausea, vomiting, or diarrhea. °· You have a bad smelling vaginal discharge. °· You have pain with urination. °· You notice increased swelling in your face, hands, legs, or ankles. °SEEK IMMEDIATE MEDICAL CARE IF:  °· You have a fever. °· You are leaking fluid from your vagina. °· You have spotting or bleeding from your vagina. °· You have severe abdominal cramping or pain. °· You have rapid weight gain or loss. °· You vomit blood or material that looks like coffee grounds. °· You are exposed to German measles and have never had them. °· You are exposed to fifth disease or chickenpox. °· You develop a severe headache. °· You have shortness of breath. °· You have any kind of trauma, such as from a fall or a car accident. °  °This information is not intended to replace advice given to you by your health care provider. Make sure you discuss any questions you have with your health care provider. °  °Document Released: 09/02/2001 Document Revised: 09/29/2014 Document Reviewed: 07/19/2013 °Elsevier Interactive Patient Education ©2016 [employer] ° °Expedited Partner Therapy:  °Information Sheet for Patients and Partners  °            ° °You have been offered expedited partner therapy (EPT). This information sheet contains important information and warnings you need to be aware of, so please read it carefully.  ° °Expedited Partner Therapy (EPT) is the clinical practice of treating the sexual partners of persons who receive chlamydia, gonorrhea, or trichomoniasis diagnoses by providing medications or prescriptions to the patient. Patients then provide partners with these therapies without the health-care provider having examined the partner. In other words, EPT is a convenient, fast and private way for patients to help their sexual partners get treated.  ° °Chlamydia and gonorrhea are bacterial infections you get from having sex with a person who is already infected. Trichomoniasis (or “trich”) is a very common sexually  transmitted infection (STI) that is caused by infection with a protozoan parasite called Trichomonas vaginalis.  Many people with these infections don’t know it because they feel fine, but without treatment these infections can cause serious health problems, such as pelvic inflammatory disease, ectopic pregnancy, infertility and increased risk of HIV.  ° °It is important to get   treated as soon as possible to protect your health, to avoid spreading these infections to others, and to prevent yourself from becoming re-infected. The good news is these infections can be easily cured with proper antibiotic medicine. The best way to take care of your self is to see a doctor or go to your local health department. If you are not able to see a doctor or other medical provider, you should take EPT.  ° ° °Recommended Medication: °EPT for Chlamydia:  Azithromycin (Zithromax) 1 gram orally in a single dose °EPT for Gonorrhea:  Cefixime (Suprax) 400 milligrams orally in a single dose PLUS azithromycin (Zithromax) 1 gram orally in a single dose °EPT for Trichomoniasis:  Metronidazole (Flagyl) 2 grams orally in a single dose ° ° °These medicines are very safe. However, you should not take them if you have ever had an allergic reaction (like a rash) to any of these medicines: azithromycin (Zithromax), erythromycin, clarithromycin (Biaxin), metronidazole (Flagyl), tinidazole (Tindimax). If you are uncertain about whether you have an allergy, call your medical provider or pharmacist before taking this medicine. If you have a serious, long-term illness like kidney, liver or heart disease, colitis or stomach problems, or you are currently taking other prescription medication, talk to your provider before taking this medication.  ° °Women: If you have lower belly pain, pain during sex, vomiting, or a fever, do not take this medicine. Instead, you should see a medical provider to be certain you do not have pelvic inflammatory disease  (PID). PID can be serious and lead to infertility, pregnancy problems or chronic pelvic pain.  ° °Pregnant Women: It is very important for you to see a doctor to get pregnancy services and pre-natal care. These antibiotics for EPT are safe for pregnant women, but you still need to see a medical provider as soon as possible. It is also important to note that Doxycycline is an alternative therapy for chlamydia, but it should not be taken by someone who is pregnant.  ° °Men: If you have pain or swelling in the testicles or a fever, do not take this medicine and see a medical provider.    ° °Men who have sex with men (MSM): MSM in [place] continue to experience high rates of syphilis and HIV. Many MSM with gonorrhea or chlamydia could also have syphilis and/or HIV and not know it. If you are a man who has sex with other men, it is very important that you see a medical provider and are tested for HIV and syphilis. EPT is not recommended for gonorrhea for MSM.  Recommended treatment for gonorrhea for MSM is Rocephin (shot) AND azithromycin due to decreased cure rate.  Please see your medical provider if this is the case.   ° °Along with this information sheet is a prescription for the medicine. If you receive a prescription it will be in your name and will indicate your date of birth, or it will be in the name of “Expedited Partner Therapy”.   In either case, you can have the prescription filled at a pharmacy. You will be responsible for the cost of the medicine, unless you have prescription drug coverage. In that case, you could provide your name so the pharmacy could bill your health plan.  ° °Take the medication as directed. Some people will have a mild, upset stomach, which does not last long. AVOID alcohol 24 hours after taking metronidazole (Flagyl) to reduce the possibility of a disulfiram-like reaction (severe vomiting and abdominal   pain).  After taking the medicine, do not have sex for 7 days. Do not share  this medicine or give it to anyone else. It is important to tell everyone you have had sex with in the last 60 days that they need to go and get tested for sexually transmitted infections.  ° °Ways to prevent these and other sexually transmitted infections (STIs):  ° °• Abstain from sex. This is the only sure way to avoid getting an STI.  °• Use barrier methods, such as condoms, consistently and correctly.  °• Limit the number of sexual partners.  °• Have regular physical exams, including testing for STIs.  ° °For more information about EPT or other issues pertaining to an STI, please contact your medical provider or the [place] [facility] at [phone] or [email or web address]   ° °

## 2015-10-07 LAB — HIV ANTIBODY (ROUTINE TESTING W REFLEX): HIV Screen 4th Generation wRfx: NONREACTIVE

## 2015-10-08 LAB — GC/CHLAMYDIA PROBE AMP (~~LOC~~) NOT AT ARMC
Chlamydia: NEGATIVE
NEISSERIA GONORRHEA: NEGATIVE

## 2015-11-01 ENCOUNTER — Ambulatory Visit (INDEPENDENT_AMBULATORY_CARE_PROVIDER_SITE_OTHER): Payer: Medicaid Other | Admitting: Internal Medicine

## 2015-11-01 ENCOUNTER — Encounter: Payer: Self-pay | Admitting: Internal Medicine

## 2015-11-01 VITALS — BP 152/97 | HR 71 | Temp 98.8°F | Ht 67.0 in | Wt 197.8 lb

## 2015-11-01 DIAGNOSIS — R519 Headache, unspecified: Secondary | ICD-10-CM | POA: Insufficient documentation

## 2015-11-01 DIAGNOSIS — I10 Essential (primary) hypertension: Secondary | ICD-10-CM | POA: Diagnosis present

## 2015-11-01 DIAGNOSIS — R51 Headache: Secondary | ICD-10-CM

## 2015-11-01 DIAGNOSIS — G4452 New daily persistent headache (NDPH): Secondary | ICD-10-CM

## 2015-11-01 DIAGNOSIS — Z0000 Encounter for general adult medical examination without abnormal findings: Secondary | ICD-10-CM

## 2015-11-01 MED ORDER — AMLODIPINE BESYLATE 5 MG PO TABS: 5.0000 mg | ORAL_TABLET | Freq: Every day | ORAL | Status: DC

## 2015-11-01 MED ORDER — NAPROXEN 500 MG PO TABS: 500.0000 mg | ORAL_TABLET | Freq: Two times a day (BID) | ORAL | Status: DC

## 2015-11-01 MED ORDER — ONDANSETRON HCL 4 MG PO TABS: 4.0000 mg | ORAL_TABLET | Freq: Every day | ORAL | Status: AC | PRN

## 2015-11-01 MED ORDER — NAPROXEN 500 MG PO TABS: 500.0000 mg | ORAL_TABLET | Freq: Two times a day (BID) | ORAL | Status: DC | PRN

## 2015-11-01 NOTE — Patient Instructions (Signed)
1. Please make a follow up appointment for 4 weeks.   2. Please take all medications as previously prescribed with the following changes:  Take Amlodipine 5 mg daily for high blood pressure  Take Naproxen 500 mg prn   3. If you have worsening of your symptoms or new symptoms arise, please call the clinic [phone]), or go to the ER immediately if symptoms are severe.  You have done a great job in taking all your medications. Please continue to do this.

## 2015-11-01 NOTE — Progress Notes (Signed)
   Subjective:   Patient ID: Raven Webb female   DOB: 03/20/1986 30 y.o.   MRN: [id number]  HPI: [name] is a 30 y.o. female w/ PMHx of HTN previously on HCTZ, presents to the clinic today for a follow-up visit regarding her blood pressure. Patient was seen in October 2016 at which time her HCTZ was officially discontinued because she had previously stopped the medication and had normal blood pressure when in the clinic. The patient states she did not like the way this medication made her feel, although she could not give specifics. Today, her SBP is in the 150's.   Also claims to have a headache, frontal, bilateral, associated with mild nausea, and photophobia. Says her pain gets worse if she bends down to pick something up. Has checked her BP at home and seems to think her headache corresponds to her blood pressure being elevated. No neck pain or stiffness, no lethargy, fevers, chills, blurred vision, or epistaxis. Headaches usually occur in the afternoon or evening per patient. Denies any stressors or anxiety, although has had recent elective abortion, does not claim that this caused her any emotional distress.   Past Medical History  Diagnosis Date  . Hypertension   . UTI (lower urinary tract infection)    Current Outpatient Prescriptions  Medication Sig Dispense Refill  . amLODipine (NORVASC) 5 MG tablet Take 1 tablet (5 mg total) by mouth daily. 30 tablet 5  . naproxen (NAPROSYN) 500 MG tablet Take 1 tablet (500 mg total) by mouth 2 (two) times daily as needed. 30 tablet 1  . ondansetron (ZOFRAN) 4 MG tablet Take 1 tablet (4 mg total) by mouth daily as needed for nausea or vomiting. 30 tablet 0   No current facility-administered medications for this visit.    Review of Systems: General: Denies fever, chills, diaphoresis, appetite change and fatigue.  Respiratory: Denies SOB, DOE, cough, and wheezing.   Cardiovascular: Denies chest pain and palpitations.    Gastrointestinal: Denies nausea, vomiting, abdominal pain, and diarrhea.  Genitourinary: Denies dysuria, increased frequency, and flank pain. Endocrine: Denies hot or cold intolerance, polyuria, and polydipsia. Musculoskeletal: Denies myalgias, back pain, joint swelling, arthralgias and gait problem.  Skin: Denies pallor, rash and wounds.  Neurological: Positive for headache. Denies dizziness, seizures, syncope, weakness, lightheadedness, numbness.  Psychiatric/Behavioral: Denies mood changes, and sleep disturbances.  Objective:   Physical Exam: Filed Vitals:   11/01/15 1552  BP: 152/97  Pulse: 71  Temp: 98.8 F (37.1 C)  TempSrc: Oral  Height:  (1.702 m)  Weight: 197 lb 12.8 oz (89.721 kg)  SpO2: 100%    General: AA female, alert, cooperative, NAD. HEENT: PERRL, EOMI. Moist mucus membranes Neck: Full range of motion without pain, supple, no lymphadenopathy or carotid bruits Lungs: Clear to ascultation bilaterally, normal work of respiration, no wheezes, rales, rhonchi Heart: RRR, no murmurs, gallops, or rubs Abdomen: Soft, non-tender, non-distended, BS + Extremities: No cyanosis, clubbing, or edema Neurologic: Alert & oriented x3, cranial nerves II-XII intact, strength grossly intact, sensation intact to light touch   Assessment & Plan:   Please see problem based assessment and plan.

## 2015-11-01 NOTE — Assessment & Plan Note (Addendum)
Declined flu shot

## 2015-11-02 NOTE — Assessment & Plan Note (Signed)
BP Readings from Last 3 Encounters:  11/01/15 152/97  10/06/15 165/96  06/29/15 135/78    Lab Results  Component Value Date   NA 138 06/29/2015   K 4.3 06/29/2015   CREATININE 0.81 06/29/2015    Assessment: Blood pressure control:  Elevated off BP medications Comments: Patient recently had HCTZ discontinued because she had not been taking it (made her feel bad) and had normal BP at clinic visits off BP medications. States that her BP has been elevated at home/when she would go to the pharmacy, as high as 160's systolic.   Plan: Medications:  Start Norvasc 5 mg daily given that she did not seem to like HCTZ.  Other plans: RTC in 4 weeks for follow up and BP recheck.

## 2015-11-02 NOTE — Assessment & Plan Note (Signed)
Patient with frontal headache, dull in nature, with some mild associated nausea and photophobia. Denies phonophobia. Claims that the headache gets worse with position, feels worse when she puts her head down. Denies any recent stressors, although it does seem that she has been through quite a bit lately with her family and personal life. Denies any neck stiffness, changes in her vision, fevers, or chills. Says the headaches seem to correlate with her BP being elevated. Do not suspect cluster headache given description, seems more like tension headache vs migraine. Pupils equal and reactive. Red reflex seen on ophthalmoscope. Did not appreciate fundal anatomy. Some question of pseudotumor given positional changes, but this seems less likely than tension type headache.  -Control BP with Amlodipine -Naproxen 500 mg bid prn for headache -Encouraged adequate hydration -Readdress at next visit

## 2015-11-06 NOTE — Progress Notes (Signed)
Internal Medicine Clinic Attending  Case discussed with Dr. Jones at the time of the visit.  We reviewed the resident's history and exam and pertinent patient test results.  I agree with the assessment, diagnosis, and plan of care documented in the resident's note.  

## 2016-01-29 ENCOUNTER — Encounter: Payer: Self-pay | Admitting: Obstetrics & Gynecology

## 2016-01-30 ENCOUNTER — Ambulatory Visit (INDEPENDENT_AMBULATORY_CARE_PROVIDER_SITE_OTHER): Payer: Medicaid Other | Admitting: Obstetrics & Gynecology

## 2016-01-30 ENCOUNTER — Encounter: Payer: Self-pay | Admitting: Obstetrics & Gynecology

## 2016-01-30 VITALS — BP 158/97 | HR 72 | Wt 196.1 lb

## 2016-01-30 DIAGNOSIS — Z309 Encounter for contraceptive management, unspecified: Secondary | ICD-10-CM

## 2016-01-30 DIAGNOSIS — N926 Irregular menstruation, unspecified: Secondary | ICD-10-CM

## 2016-01-30 DIAGNOSIS — Z3009 Encounter for other general counseling and advice on contraception: Secondary | ICD-10-CM

## 2016-01-30 LAB — POCT PREGNANCY, URINE: Preg Test, Ur: NEGATIVE

## 2016-01-30 NOTE — Patient Instructions (Signed)

## 2016-01-30 NOTE — Progress Notes (Signed)
Patient ID: Raven Webb, female   DOB: 09/23/1985, 30 y.o.   MRN: [id number] History:  30 y.o. [id number] here today for  Last sexually active 01/09/2016 LMP 5/3-01/2016- lighter normal only 1 1/2 days LNMP 01/22/2016.   Pt c/o nausea and breast tenderness.   The following portions of the patient's history were reviewed and updated as appropriate: allergies, current medications, past family history, past medical history, past social history, past surgical history and problem list.  Review of Systems:  Pertinent items are noted in HPI.  Objective:  Physical Exam Blood pressure 158/97, pulse 72, weight 196 lb 1.6 oz (88.95 kg), last menstrual period 12/31/2015, unknown if currently breastfeeding.  Labs and Imaging Neg UPT  Assessment & Plan:  Elevated BP- took meds today.  Rec f/u with primary care physician  Contraception counseling- pt with pregnancy sx and LMP was irreg.  Rec f/u in 2 weeks if UPT neg may get Depo Provera 150mg  IM Rec abstinence until next visit  Chasta Deshpande L. Harraway-Smith, M.D., Evern CoreFACOG

## 2016-02-13 ENCOUNTER — Ambulatory Visit (INDEPENDENT_AMBULATORY_CARE_PROVIDER_SITE_OTHER): Payer: Medicaid Other | Admitting: General Practice

## 2016-02-13 VITALS — BP 146/81 | HR 72 | Ht 67.0 in | Wt 195.0 lb

## 2016-02-13 DIAGNOSIS — Z3202 Encounter for pregnancy test, result negative: Secondary | ICD-10-CM

## 2016-02-13 DIAGNOSIS — Z3042 Encounter for surveillance of injectable contraceptive: Secondary | ICD-10-CM | POA: Diagnosis present

## 2016-02-13 LAB — POCT PREGNANCY, URINE: PREG TEST UR: NEGATIVE

## 2016-02-13 MED ORDER — MEDROXYPROGESTERONE ACETATE 150 MG/ML IM SUSP
150.0000 mg | Freq: Once | INTRAMUSCULAR | Status: AC
  Administered 2016-02-13: 150 mg via INTRAMUSCULAR

## 2016-04-16 ENCOUNTER — Ambulatory Visit: Payer: Medicaid Other

## 2016-04-25 ENCOUNTER — Ambulatory Visit (HOSPITAL_COMMUNITY)
Admission: RE | Admit: 2016-04-25 | Discharge: 2016-04-25 | Disposition: A | Discharge status: Home / Self Care | Payer: Medicaid Other | Source: Ambulatory Visit | Attending: Oncology | Admitting: Oncology

## 2016-04-25 ENCOUNTER — Ambulatory Visit (INDEPENDENT_AMBULATORY_CARE_PROVIDER_SITE_OTHER): Payer: Medicaid Other | Admitting: Internal Medicine

## 2016-04-25 VITALS — BP 145/91 | HR 76 | Temp 98.8°F | Ht 67.0 in | Wt 204.1 lb

## 2016-04-25 DIAGNOSIS — G8929 Other chronic pain: Secondary | ICD-10-CM | POA: Insufficient documentation

## 2016-04-25 DIAGNOSIS — M25562 Pain in left knee: Secondary | ICD-10-CM

## 2016-04-25 DIAGNOSIS — M25569 Pain in unspecified knee: Secondary | ICD-10-CM

## 2016-04-25 MED ORDER — NAPROXEN 500 MG PO TABS: 500.0000 mg | ORAL_TABLET | Freq: Two times a day (BID) | ORAL | 0 refills | Status: DC | PRN

## 2016-04-25 NOTE — Assessment & Plan Note (Addendum)
Assessment:  Left knee pain Patient had a fall on both knees 3 years ago she states she has always had some pain but started noticing increasing pain in May 2017.  She may have developed osteoarthritis following trauma to her knee.    Plan Naproxen 500mg  BID X ray of left knee  If symptoms continue to persist: 1. may need to counsel on diet and exercise 2. She may benefit from physical therapy to improve strength in surrounding muscle groups  3. May need joint injection

## 2016-04-25 NOTE — Patient Instructions (Addendum)
Raven Webb Please take Naproxen 500mg  twice a day as needed for knee pain and wear the knee wrap to help with symptom relief Please go upstairs to the 2nd floor Admission for X ray of left knee

## 2016-04-25 NOTE — Progress Notes (Signed)
   CC: Left knee pain  HPI:  [name] is a 30 y.o. female with PMHx of hypertension that presents to [facility] for left knee pain that has been worsening since May/2017.  She states the pain is a combination of sharp and dull/achy.  The pain is relieved with rest and worse throughout the day when bearing weight on it.  She is able to do her daily activities but requires having to sit more often to rest her knee.   She denies shooting pains down her leg.  She reports a fall 3 years ago, landing on concrete on both knees chasing her dog.  No X ray imaging on file.  She has never tried a wrap or brace for knee support.  She has never seen physical therapy.  She has tried ibuprofen 2 pills 4 times a day with little benefit.   Past Medical History:  Diagnosis Date  . Hypertension   . UTI (lower urinary tract infection)     Review of Systems:  As noted per HPI  Physical Exam:  Vitals:   04/25/16 1036  BP: (!) 145/91  Pulse: 76  Temp: 98.8 F (37.1 C)  TempSrc: Oral  SpO2: 100%  Weight: 204 lb 1.6 oz (92.6 kg)  Height: 5\' 7"  (1.702 m)   Physical Exam  Constitutional: She is well-developed, well-nourished, and in no distress.  Cardiovascular: Normal rate and regular rhythm.   Pulmonary/Chest: Effort normal and breath sounds normal.  Musculoskeletal: She exhibits no edema.  Motor strength lower extremity 5/5 on right and 4/5 left No sensory deficit on lower extremities bilaterally  Point tenderness on the infrapatellar bursa No effusion noted      Assessment & Plan:   See encounters tab for problem based medical decision making.   Patient seen with Dr. Cyndie Chime

## 2016-04-28 NOTE — Progress Notes (Signed)
Medicine attending: I personally interviewed and briefly examined this patient on the day of the patient visit and reviewed pertinent clinical ,laboratory, and radiographic data  with resident physician Dr. Geralyn CorwinJessica Hoffman and we discussed a management plan. Point tenderness on palpation over patellar tendon. Suspect simple tendinitis. Rx NSAID, knee support/ace bandage. Regular X-ray. Short interval follow up to assess progress.

## 2016-04-29 ENCOUNTER — Telehealth: Payer: Self-pay | Admitting: Internal Medicine

## 2016-04-29 NOTE — Telephone Encounter (Signed)
Would like results from xray

## 2016-04-30 ENCOUNTER — Ambulatory Visit: Payer: Medicaid Other

## 2016-04-30 NOTE — Telephone Encounter (Signed)
[facility] so she may want to call her with results.  But essentially very unremarkable x-ray with no abnormalities seen.  Thanks!

## 2016-05-02 NOTE — Telephone Encounter (Signed)
Attempted to call patient. LVM to return call. 

## 2016-05-06 ENCOUNTER — Ambulatory Visit (INDEPENDENT_AMBULATORY_CARE_PROVIDER_SITE_OTHER): Payer: Medicaid Other

## 2016-05-06 VITALS — BP 139/92 | HR 70

## 2016-05-06 DIAGNOSIS — Z3042 Encounter for surveillance of injectable contraceptive: Secondary | ICD-10-CM | POA: Diagnosis not present

## 2016-05-06 MED ORDER — MEDROXYPROGESTERONE ACETATE 150 MG/ML IM SUSP
150.0000 mg | Freq: Once | INTRAMUSCULAR | Status: AC
  Administered 2016-05-06: 150 mg via INTRAMUSCULAR

## 2016-05-06 NOTE — Progress Notes (Signed)
Patient presented to office today for her depo-provera. Patient tolerated well and will follow up in three months for second injection.

## 2016-05-07 NOTE — Telephone Encounter (Signed)
Relayed your message to patient via phone

## 2016-07-16 ENCOUNTER — Ambulatory Visit: Payer: Medicaid Other

## 2016-07-23 ENCOUNTER — Ambulatory Visit (INDEPENDENT_AMBULATORY_CARE_PROVIDER_SITE_OTHER): Payer: Medicaid Other | Admitting: *Deleted

## 2016-07-23 VITALS — BP 146/90 | HR 71 | Wt 213.2 lb

## 2016-07-23 DIAGNOSIS — Z3042 Encounter for surveillance of injectable contraceptive: Secondary | ICD-10-CM

## 2016-07-23 MED ORDER — MEDROXYPROGESTERONE ACETATE 150 MG/ML IM SUSP
150.0000 mg | Freq: Once | INTRAMUSCULAR | Status: AC
  Administered 2016-07-23: 150 mg via INTRAMUSCULAR

## 2016-07-23 NOTE — Progress Notes (Signed)
Depo Provera given as ordered and tolerated well. Next injection due 10/08/16-10/22/16 and she will also need Annual exam on the same Raven Webb - last Pap done 3 years ago. Advised pt to contact PCP office for evaluation of BP as she states that it is always high despite taking her prescribed medication.  Pt agreed and voiced understanding.

## 2016-09-03 ENCOUNTER — Other Ambulatory Visit: Payer: Self-pay | Admitting: Internal Medicine

## 2016-09-03 DIAGNOSIS — I10 Essential (primary) hypertension: Secondary | ICD-10-CM

## 2016-10-02 ENCOUNTER — Ambulatory Visit: Payer: Medicaid Other

## 2016-10-02 ENCOUNTER — Ambulatory Visit (INDEPENDENT_AMBULATORY_CARE_PROVIDER_SITE_OTHER): Payer: Medicaid Other | Admitting: Internal Medicine

## 2016-10-02 VITALS — BP 150/87 | HR 76 | Temp 98.5°F | Wt 215.1 lb

## 2016-10-02 DIAGNOSIS — Z79899 Other long term (current) drug therapy: Secondary | ICD-10-CM

## 2016-10-02 DIAGNOSIS — I10 Essential (primary) hypertension: Secondary | ICD-10-CM

## 2016-10-02 DIAGNOSIS — R002 Palpitations: Secondary | ICD-10-CM

## 2016-10-02 MED ORDER — HYDROCHLOROTHIAZIDE 25 MG PO TABS: 25.0000 mg | ORAL_TABLET | Freq: Every day | ORAL | 1 refills | Status: DC

## 2016-10-02 MED ORDER — AMLODIPINE BESYLATE 5 MG PO TABS: 5.0000 mg | ORAL_TABLET | Freq: Every day | ORAL | 0 refills | Status: DC

## 2016-10-02 NOTE — Progress Notes (Signed)
   CC: Hypertension and palpitations  HPI:  [name] is a 31 y.o. woman with hypertension here for one-month follow-up after starting amlodipine. She is taking the medicine daily including this morning without any complaints. She does not check her blood pressures at home. Her only symptomatic complaint today is palpitations she has been having 2-3 times weekly she thinks for about the past 6 months. These happen anytime of day or night and are not associated with any lightheadedness, dizziness, anxiety, or chest pain. She does feel a bit short of breath that improves after taking some deep breaths and waiting 1-2 minutes. She never recalls having similar palpitations until these past 6 months. On chart review she has palpitations noted as far back as 2012 but not an active problem. In between these episodes she feels fine and does not have any increased dyspnea on exertion. She denies any major weight change and denies lower extremity edema.  See problem based assessment and plan below for additional details  Past Medical History:  Diagnosis Date  . Hypertension   . UTI (lower urinary tract infection)     Review of Systems:  Review of Systems  Constitutional: Negative for malaise/fatigue.  Eyes: Negative for blurred vision.  Respiratory: Negative for shortness of breath.   Cardiovascular: Positive for palpitations. Negative for chest pain and leg swelling.  Gastrointestinal: Negative for abdominal pain and heartburn.  Neurological: Negative for dizziness and speech change.  Psychiatric/Behavioral: Negative for depression. The patient is not nervous/anxious.     Physical Exam: Physical Exam  Constitutional: She is well-developed, well-nourished, and in no distress. No distress.  HENT:  Head: Normocephalic and atraumatic.  Neck: No thyromegaly present.  Cardiovascular: Normal rate and regular rhythm.   Pulmonary/Chest: Effort normal and breath sounds normal.    Musculoskeletal: Normal range of motion. She exhibits no edema.  Skin: Skin is warm and dry. No erythema.  Psychiatric: Mood and affect normal.    Vitals:   10/02/16 0904  BP: (!) 150/87  Pulse: 76  Temp: 98.5 F (36.9 C)  TempSrc: Oral  SpO2: 100%  Weight: 215 lb 1.6 oz (97.6 kg)    Assessment & Plan:   See Encounters Tab for problem based charting.  Patient discussed with Dr. Oswaldo DoneVincent

## 2016-10-02 NOTE — Patient Instructions (Signed)
It was a pleasure to see you today [name].  I would like you to resume taking HCTZ (hydrochlorothiazide) 25mg  once daily for your blood pressure and see you back in about a month to recheck our progress.   Your heart palpitations could be due to many underlying factors. You do not have any highly concerning features which is reassuring. We are checking some blood tests today to look for a metabolic problem or thyroid hormone problem as a cause of these symptoms.

## 2016-10-03 LAB — BMP8+ANION GAP
ANION GAP: 18 mmol/L (ref 10.0–18.0)
BUN / CREAT RATIO: 9 (ref 9–23)
BUN: 9 mg/dL (ref 6–20)
CO2: 21 mmol/L (ref 18–29)
Calcium: 9.7 mg/dL (ref 8.7–10.2)
Chloride: 102 mmol/L (ref 96–106)
Creatinine, Ser: 0.98 mg/dL (ref 0.57–1.00)
GFR calc Af Amer: 91 mL/min/{1.73_m2} (ref >59)
GFR, EST NON AFRICAN AMERICAN: 79 mL/min/{1.73_m2} (ref >59)
Glucose: 95 mg/dL (ref 65–99)
POTASSIUM: 3.8 mmol/L (ref 3.5–5.2)
SODIUM: 141 mmol/L (ref 134–144)

## 2016-10-03 LAB — TSH: TSH: 1.43 u[IU]/mL (ref 0.450–4.500)

## 2016-10-05 NOTE — Assessment & Plan Note (Signed)
A: She is having heart palpitations on a pretty regular basis with no associated symptoms. Review is very benign for common causes such as caffeine, disordered sleep, causative medications, illicit substance use, or anxiety. She has not previously ever had problems with thyroid dysfunction or metabolic abnormalities.  So far in the absence of any associated symptoms such as shortness of breath, diaphoresis, syncope or persistent arrhythmia that doesn't go away within a minute or 2 I don't feel strongly that an aggressive workup including ambulatory monitoring is needed. EKG would not be high yield this time since she is not in a abnormal rhythm during this visit.  An additional cause to consider would be adrenal abnormality including pheochromocytoma especially if her hypertension remains resistant to additional therapy along with these palpitations.  P: -Check TSH and Bmet

## 2016-10-05 NOTE — Assessment & Plan Note (Signed)
A: Blood pressure is uncontrolled today at 150/87. She's been compliant with amlodipine 5 mg daily and is not having any symptoms on this treatment plan. It's possible the titrating up her amlodipine would be more benefit but I doubt she is going to get to goal without addition of a second agent. She was previously on hydrochlorothiazide 25 mg and she reports stopping it because her blood pressure was fine without it. On chart review there is some note of her when he discontinued it due to side effects but she didn't say anything about this when asked today. An alternative would be to try chlorthalidone.   P: - Continue amlodipine 5 mg daily - Start HCTZ 25 mg daily

## 2016-10-07 ENCOUNTER — Encounter: Payer: Self-pay | Admitting: Internal Medicine

## 2016-10-07 NOTE — Progress Notes (Signed)
Internal Medicine Clinic Attending  Case discussed with Dr. Rice at the time of the visit.  We reviewed the resident's history and exam and pertinent patient test results.  I agree with the assessment, diagnosis, and plan of care documented in the resident's note.  

## 2016-10-08 ENCOUNTER — Ambulatory Visit: Payer: Medicaid Other

## 2016-10-13 ENCOUNTER — Encounter: Payer: Self-pay | Admitting: General Practice

## 2016-10-13 ENCOUNTER — Ambulatory Visit: Payer: Medicaid Other

## 2016-10-20 ENCOUNTER — Encounter: Payer: Self-pay | Admitting: Internal Medicine

## 2016-10-24 ENCOUNTER — Other Ambulatory Visit (HOSPITAL_COMMUNITY)
Admission: RE | Admit: 2016-10-24 | Discharge: 2016-10-24 | Disposition: A | Discharge status: Home / Self Care | Payer: Medicaid Other | Source: Ambulatory Visit | Attending: Obstetrics & Gynecology | Admitting: Obstetrics & Gynecology

## 2016-10-24 ENCOUNTER — Ambulatory Visit (INDEPENDENT_AMBULATORY_CARE_PROVIDER_SITE_OTHER): Payer: Medicaid Other | Admitting: Obstetrics & Gynecology

## 2016-10-24 ENCOUNTER — Encounter: Payer: Self-pay | Admitting: Obstetrics & Gynecology

## 2016-10-24 VITALS — BP 149/91 | HR 70 | Wt 215.8 lb

## 2016-10-24 DIAGNOSIS — Z0000 Encounter for general adult medical examination without abnormal findings: Secondary | ICD-10-CM | POA: Diagnosis not present

## 2016-10-24 DIAGNOSIS — Z3042 Encounter for surveillance of injectable contraceptive: Secondary | ICD-10-CM | POA: Diagnosis not present

## 2016-10-24 DIAGNOSIS — Z01419 Encounter for gynecological examination (general) (routine) without abnormal findings: Secondary | ICD-10-CM

## 2016-10-24 DIAGNOSIS — Z113 Encounter for screening for infections with a predominantly sexual mode of transmission: Secondary | ICD-10-CM | POA: Insufficient documentation

## 2016-10-24 DIAGNOSIS — Z3202 Encounter for pregnancy test, result negative: Secondary | ICD-10-CM | POA: Diagnosis not present

## 2016-10-24 LAB — POCT URINALYSIS DIP (DEVICE)
Bilirubin Urine: NEGATIVE
Glucose, UA: NEGATIVE mg/dL
Hgb urine dipstick: NEGATIVE
KETONES UR: NEGATIVE mg/dL
NITRITE: NEGATIVE
PH: 5.5 (ref 5.0–8.0)
PROTEIN: NEGATIVE mg/dL
Specific Gravity, Urine: >=1.03 (ref 1.005–1.030)
Urobilinogen, UA: 0.2 mg/dL (ref 0.0–1.0)

## 2016-10-24 MED ORDER — MEDROXYPROGESTERONE ACETATE 150 MG/ML IM SUSP
150.0000 mg | Freq: Once | INTRAMUSCULAR | Status: AC
  Administered 2016-10-24: 150 mg via INTRAMUSCULAR

## 2016-10-24 MED ORDER — MEDROXYPROGESTERONE ACETATE 150 MG/ML IM SUSP
150.0000 mg | INTRAMUSCULAR | Status: DC
  Administered 2017-01-21 – 2018-07-13 (×7): 150 mg via INTRAMUSCULAR

## 2016-10-24 NOTE — Progress Notes (Signed)
GYNECOLOGY ANNUAL PREVENTATIVE CARE ENCOUNTER NOTE  Subjective:   Raven Webb is a 31 y.o. [id number] female here for a routine annual gynecologic exam.  Current complaints: none. Wants to continue Depo Provera contraception, last dose was on 07/23/16.   Denies abnormal vaginal bleeding, discharge, pelvic pain, problems with intercourse or other gynecologic concerns.    Gynecologic History No LMP recorded. Patient has had an injection. Contraception: Depo-Provera injections Last Pap: 2014. Results were: normal  Obstetric History OB History  Gravida Para Term Preterm AB Living  4 3 1 2   3   SAB TAB Ectopic Multiple Live Births               # Outcome Date GA Lbr Len/2nd Weight Sex Delivery Anes PTL Lv  4 Gravida           3 Term           2 Preterm           1 Preterm               Past Medical History:  Diagnosis Date  . Hypertension   . UTI (lower urinary tract infection)     Past Surgical History:  Procedure Laterality Date  . WISDOM TOOTH EXTRACTION      Current Outpatient Prescriptions on File Prior to Visit  Medication Sig Dispense Refill  . amLODipine (NORVASC) 5 MG tablet Take 1 tablet (5 mg total) by mouth daily. 30 tablet 0  . hydrochlorothiazide (HYDRODIURIL) 25 MG tablet Take 1 tablet (25 mg total) by mouth daily. 30 tablet 1  . ondansetron (ZOFRAN) 4 MG tablet Take 1 tablet (4 mg total) by mouth daily as needed for nausea or vomiting. (Patient not taking: Reported on 10/24/2016) 30 tablet 0   No current facility-administered medications on file prior to visit.     No Known Allergies  Social History   Social History  . Marital status: Single    Spouse name: N/A  . Number of children: N/A  . Years of education: N/A   Occupational History  . Not on file.   Social History Main Topics  . Smoking status: Never Smoker  . Smokeless tobacco: Never Used  . Alcohol use No  . Drug use: No  . Sexual activity: Not on file   Other Topics Concern  .  Not on file   Social History Narrative  . No narrative on file    Family History  Problem Relation Age of Onset  . Hypertension Mother   . Diabetes Mother   . Hypertension Father   . Hypertension Sister   . Diabetes Sister   . Hypertension Maternal Uncle   . Kidney disease Maternal Uncle     The following portions of the patient's history were reviewed and updated as appropriate: allergies, current medications, past family history, past medical history, past social history, past surgical history and problem list.  Review of Systems Pertinent items noted in HPI and remainder of comprehensive ROS otherwise negative.   Objective:  BP (!) 149/91   Pulse 70   Wt 215 lb 12.8 oz (97.9 kg)   BMI 33.80 kg/m  CONSTITUTIONAL: Well-developed, well-nourished female in no acute distress.  HENT:  Normocephalic, atraumatic, External right and left ear normal. Oropharynx is clear and moist EYES: Conjunctivae and EOM are normal. Pupils are equal, round, and reactive to light. No scleral icterus.  NECK: Normal range of motion, supple, no masses.  Normal thyroid.  SKIN: Skin is warm and dry. No rash noted. Not diaphoretic. No erythema. No pallor. NEUROLOGIC: Alert and oriented to person, place, and time. Normal reflexes, muscle tone coordination. No cranial nerve deficit noted. PSYCHIATRIC: Normal mood and affect. Normal behavior. Normal judgment and thought content. CARDIOVASCULAR: Normal heart rate noted, regular rhythm RESPIRATORY: Clear to auscultation bilaterally. Effort and breath sounds normal, no problems with respiration noted. BREASTS: Symmetric in size. No masses, skin changes, nipple drainage, or lymphadenopathy. ABDOMEN: Soft, normal bowel sounds, no distention noted.  No tenderness, rebound or guarding.  PELVIC: Normal appearing external genitalia; normal appearing vaginal mucosa and cervix.  No abnormal discharge noted.  Pap smear obtained.  Normal uterine size, no other palpable  masses, no uterine or adnexal tenderness. MUSCULOSKELETAL: Normal range of motion. No tenderness.  No cyanosis, clubbing, or edema.  2+ distal pulses.   Assessment:  Annual gynecologic examination with pap smear Depo Provera contraception   Plan:  Will follow up results of pap smear and manage accordingly. Depo Provera given today Routine preventative health maintenance measures emphasized. Please refer to After Visit Summary for other counseling recommendations.    Jaynie Collins, MD, FACOG Attending Obstetrician & Gynecologist, [facility]

## 2016-10-24 NOTE — Patient Instructions (Signed)
Preventive Care 18-39 Years, Female Preventive care refers to lifestyle choices and visits with your health care provider that can promote health and wellness. What does preventive care include?  A yearly physical exam. This is also called an annual well check.  Dental exams once or twice a year.  Routine eye exams. Ask your health care provider how often you should have your eyes checked.  Personal lifestyle choices, including:  Daily care of your teeth and gums.  Regular physical activity.  Eating a healthy diet.  Avoiding tobacco and drug use.  Limiting alcohol use.  Practicing safe sex.  Taking vitamin and mineral supplements as recommended by your health care provider. What happens during an annual well check? The services and screenings done by your health care provider during your annual well check will depend on your age, overall health, lifestyle risk factors, and family history of disease. Counseling  Your health care provider may ask you questions about your:  Alcohol use.  Tobacco use.  Drug use.  Emotional well-being.  Home and relationship well-being.  Sexual activity.  Eating habits.  Work and work environment.  Method of birth control.  Menstrual cycle.  Pregnancy history. Screening  You may have the following tests or measurements:  Height, weight, and BMI.  Diabetes screening. This is done by checking your blood sugar (glucose) after you have not eaten for a while (fasting).  Blood pressure.  Lipid and cholesterol levels. These may be checked every 5 years starting at age 20.  Skin check.  Hepatitis C blood test.  Hepatitis B blood test.  Sexually transmitted disease (STD) testing.  BRCA-related cancer screening. This may be done if you have a family history of breast, ovarian, tubal, or peritoneal cancers.  Pelvic exam and Pap test. This may be done every 3 years starting at age 21. Starting at age 30, this may be done every 5  years if you have a Pap test in combination with an HPV test. Discuss your test results, treatment options, and if necessary, the need for more tests with your health care provider. Vaccines  Your health care provider may recommend certain vaccines, such as:  Influenza vaccine. This is recommended every year.  Tetanus, diphtheria, and acellular pertussis (Tdap, Td) vaccine. You may need a Td booster every 10 years.  Varicella vaccine. You may need this if you have not been vaccinated.  HPV vaccine. If you are 26 or younger, you may need three doses over 6 months.  Measles, mumps, and rubella (MMR) vaccine. You may need at least one dose of MMR. You may also need a second dose.  Pneumococcal 13-valent conjugate (PCV13) vaccine. You may need this if you have certain conditions and were not previously vaccinated.  Pneumococcal polysaccharide (PPSV23) vaccine. You may need one or two doses if you smoke cigarettes or if you have certain conditions.  Meningococcal vaccine. One dose is recommended if you are age 19-21 years and a first-year college student living in a residence hall, or if you have one of several medical conditions. You may also need additional booster doses.  Hepatitis A vaccine. You may need this if you have certain conditions or if you travel or work in places where you may be exposed to hepatitis A.  Hepatitis B vaccine. You may need this if you have certain conditions or if you travel or work in places where you may be exposed to hepatitis B.  Haemophilus influenzae type b (Hib) vaccine. You may need this   if you have certain risk factors. Talk to your health care provider about which screenings and vaccines you need and how often you need them. This information is not intended to replace advice given to you by your health care provider. Make sure you discuss any questions you have with your health care provider. Document Released: 11/04/2001 Document Revised: 05/28/2016  Document Reviewed: 07/10/2015 Elsevier Interactive Patient Education  2017 [employer].

## 2016-10-24 NOTE — Addendum Note (Signed)
Addended by: Cheree DittoGRAHAM, Katlin Ciszewski A on: 10/24/2016 11:46 AM   Modules accepted: Orders

## 2016-10-25 LAB — RPR

## 2016-10-25 LAB — HEPATITIS C ANTIBODY: HCV Ab: NEGATIVE

## 2016-10-25 LAB — HIV ANTIBODY (ROUTINE TESTING W REFLEX): HIV 1&2 Ab, 4th Generation: NONREACTIVE

## 2016-10-25 LAB — HEPATITIS B SURFACE ANTIGEN: Hepatitis B Surface Ag: NEGATIVE

## 2016-10-27 LAB — POCT PREGNANCY, URINE: PREG TEST UR: NEGATIVE

## 2016-10-28 LAB — CYTOLOGY - PAP
Chlamydia: NEGATIVE
Diagnosis: NEGATIVE
NEISSERIA GONORRHEA: NEGATIVE
Trichomonas: NEGATIVE

## 2016-11-07 ENCOUNTER — Other Ambulatory Visit: Payer: Self-pay | Admitting: Internal Medicine

## 2016-11-07 DIAGNOSIS — I10 Essential (primary) hypertension: Secondary | ICD-10-CM

## 2016-12-11 ENCOUNTER — Encounter (HOSPITAL_COMMUNITY): Payer: Self-pay | Admitting: Family Medicine

## 2016-12-11 ENCOUNTER — Ambulatory Visit (HOSPITAL_COMMUNITY)
Admission: EM | Admit: 2016-12-11 | Discharge: 2016-12-11 | Disposition: A | Discharge status: Home / Self Care | Payer: Medicaid Other | Attending: Family Medicine | Admitting: Family Medicine

## 2016-12-11 DIAGNOSIS — R002 Palpitations: Secondary | ICD-10-CM

## 2016-12-11 DIAGNOSIS — K219 Gastro-esophageal reflux disease without esophagitis: Secondary | ICD-10-CM

## 2016-12-11 DIAGNOSIS — I10 Essential (primary) hypertension: Secondary | ICD-10-CM

## 2016-12-11 MED ORDER — OMEPRAZOLE 40 MG PO CPDR: 40.0000 mg | DELAYED_RELEASE_CAPSULE | Freq: Every day | ORAL | 0 refills | Status: DC

## 2016-12-11 NOTE — ED Provider Notes (Signed)
CSN: [id number]     Arrival date & time 12/11/16  1347 History   First MD Initiated Contact with Patient 12/11/16 1444     Chief Complaint  Patient presents with  . Palpitations   (Consider location/radiation/quality/duration/timing/severity/associated sxs/prior Treatment) 31 year female presents to clinic for evaluation of cough, heart palpitations, chest pressure. Pressures worse with movement, and deep inspiration, she states her shortness of breath is worse at night, when she was sleeping in a chair. States that she's had a sensation of fullness in her throat at night. No swelling in her hands feet or ankles, pain is not worse with exertion. She does have a history of hypertension, family history of diabetes, parents are alive and well in there 9s.   The history is provided by the patient.  Palpitations  Palpitations quality:  Unable to specify Onset quality:  Gradual Duration:  9 weeks Timing:  Sporadic Progression:  Waxing and waning Chronicity:  New Context: not anxiety, not appetite suppressants, not bronchodilators, not caffeine, not dehydration, not hyperventilation, not illicit drugs and not nicotine   Relieved by:  None tried Worsened by:  Nothing Ineffective treatments:  None tried Associated symptoms: chest pressure, cough, orthopnea and shortness of breath   Associated symptoms: no back pain, no chest pain, no dizziness, no hemoptysis, no leg pain, no lower extremity edema, no malaise/fatigue, no nausea, no near-syncope, no numbness, no syncope, no vomiting and no weakness     Past Medical History:  Diagnosis Date  . Hypertension   . UTI (lower urinary tract infection)    Past Surgical History:  Procedure Laterality Date  . WISDOM TOOTH EXTRACTION     Family History  Problem Relation Age of Onset  . Hypertension Mother   . Diabetes Mother   . Hypertension Father   . Hypertension Sister   . Diabetes Sister   . Hypertension Maternal Uncle   . Kidney disease  Maternal Uncle    Social History  Substance Use Topics  . Smoking status: Never Smoker  . Smokeless tobacco: Never Used  . Alcohol use No   OB History    Gravida Para Term Preterm AB Living   4 3 1 2   3    SAB TAB Ectopic Multiple Live Births                 Review of Systems  Constitutional: Negative for appetite change, chills, fatigue, fever and malaise/fatigue.  HENT: Negative.   Respiratory: Positive for cough and shortness of breath. Negative for hemoptysis.   Cardiovascular: Positive for palpitations and orthopnea. Negative for chest pain, leg swelling, syncope and near-syncope.  Gastrointestinal: Negative for abdominal pain, constipation, nausea and vomiting.  Endocrine: Negative.   Genitourinary: Negative for dysuria, flank pain, frequency and urgency.  Musculoskeletal: Negative for back pain, myalgias, neck pain and neck stiffness.  Skin: Negative for color change, pallor and rash.  Neurological: Negative for dizziness, weakness, light-headedness and numbness.    Allergies  Patient has no known allergies.  Home Medications   Prior to Admission medications   Medication Sig Start Date End Date Taking? Authorizing Provider  amLODipine (NORVASC) 5 MG tablet Take 1 tablet (5 mg total) by mouth daily. 10/02/16  Yes Fuller Plan, MD  omeprazole (PRILOSEC) 40 MG capsule Take 1 capsule (40 mg total) by mouth daily. 12/11/16   Dorena Bodo, NP   Meds Ordered and Administered this Visit  Medications - No data to display  BP (!) 146/100 (BP Location: Left  Arm)   Pulse 86   Temp 98.7 F (37.1 C) (Oral)   Resp 16   Ht 5\' 7"  (1.702 m)   Wt 211 lb (95.7 kg)   SpO2 100%   BMI 33.05 kg/m  No data found.   Physical Exam  Constitutional: She is oriented to person, place, and time. She appears well-developed and well-nourished. No distress.  HENT:  Head: Normocephalic and atraumatic.  Right Ear: External ear normal.  Left Ear: External ear normal.  Eyes: EOM  are normal. Pupils are equal, round, and reactive to light.  Neck: Normal range of motion. Neck supple.  Cardiovascular: Normal rate, regular rhythm and normal heart sounds.   No murmur heard. Pulmonary/Chest: Effort normal and breath sounds normal. No respiratory distress. She has no wheezes. She has no rales. She exhibits tenderness (Reproducible chest wall tenderness).  Abdominal: Soft. Bowel sounds are normal. She exhibits no distension. There is no tenderness.  Musculoskeletal: She exhibits no edema or tenderness.  Lymphadenopathy:    She has no cervical adenopathy.  Neurological: She is alert and oriented to person, place, and time.  Skin: Skin is warm and dry. Capillary refill takes less than 2 seconds. She is not diaphoretic. No pallor.  Psychiatric: She has a normal mood and affect. Her behavior is normal.  Nursing note and vitals reviewed.   Urgent Care Course     ED EKG Date/Time: 12/11/2016 3:09 PM Performed by: Dorena BodoKENNARD, Ashey Tramontana Authorized by: Elvina SidleLAUENSTEIN, KURT   ECG reviewed by ED Physician in the absence of a cardiologist: yes   Previous ECG:    Previous ECG:  Unavailable Interpretation:    Interpretation: normal   Rate:    ECG rate:  80   ECG rate assessment: normal   Rhythm:    Rhythm: sinus rhythm   Ectopy:    Ectopy: none   QRS:    QRS axis:  Normal Conduction:    Conduction: normal   ST segments:    ST segments:  Normal T waves:    T waves: inverted     Inverted:  III Comments:     PR interval 150 ms QRS 80 ms QT/QTc 370/426 ms   (including critical care time)  Labs Review Labs Reviewed - No data to display  Imaging Review No results found.         MDM   1. Gastroesophageal reflux disease, esophagitis presence not specified     Based on history and physical, likelihood of underlying cardiac disease is low. Most likely reflux, prescribed Prilosec, recommend over-the-counter Zantac as well. Strict follow-up guidelines and counseling  provided on warning signs and symptoms. Encouraged her to follow up with primary care in 1-2 weeks if symptoms fail to resolve, or the emergency room at anytime is symptoms worsen.     Dorena BodoLawrence Selim Durden, NP 12/11/16 1513

## 2016-12-11 NOTE — ED Triage Notes (Signed)
Pt reports chest palpitations with SOB and dizziness. These symptoms have been coming and going for months. PT reports chest pressure during triage. PT stopped HCTZ in January and switched to amlodipine. PT hypotensive in triage.

## 2016-12-11 NOTE — Discharge Instructions (Signed)
Your symptoms are consistent with reflux. I prescribed a medicine called Prilosec, take one tablet daily. I also recommend over-the-counter Zantac, one tablet twice a day, especially his second tablet being right before bed. Should you experience chest pain with exertion, chest pain that radiates, heart palpitations, swelling in her hands, feet, or ankles, then go to the emergency room as soon as possible. If her symptoms do not resolve, follow up with her primary care provider in 1-2 weeks

## 2017-01-21 ENCOUNTER — Ambulatory Visit (INDEPENDENT_AMBULATORY_CARE_PROVIDER_SITE_OTHER): Payer: Medicaid Other | Admitting: Internal Medicine

## 2017-01-21 ENCOUNTER — Ambulatory Visit: Payer: Medicaid Other

## 2017-01-21 ENCOUNTER — Ambulatory Visit (INDEPENDENT_AMBULATORY_CARE_PROVIDER_SITE_OTHER): Payer: Medicaid Other | Admitting: *Deleted

## 2017-01-21 VITALS — BP 141/87 | HR 85 | Wt 214.0 lb

## 2017-01-21 VITALS — BP 133/78 | HR 67 | Temp 98.3°F | Ht 67.0 in | Wt 216.6 lb

## 2017-01-21 DIAGNOSIS — M25569 Pain in unspecified knee: Secondary | ICD-10-CM | POA: Diagnosis not present

## 2017-01-21 DIAGNOSIS — G8929 Other chronic pain: Secondary | ICD-10-CM | POA: Diagnosis not present

## 2017-01-21 DIAGNOSIS — Z9181 History of falling: Secondary | ICD-10-CM

## 2017-01-21 DIAGNOSIS — M25562 Pain in left knee: Principal | ICD-10-CM

## 2017-01-21 DIAGNOSIS — K219 Gastro-esophageal reflux disease without esophagitis: Secondary | ICD-10-CM | POA: Diagnosis not present

## 2017-01-21 DIAGNOSIS — Z3042 Encounter for surveillance of injectable contraceptive: Secondary | ICD-10-CM

## 2017-01-21 MED ORDER — OMEPRAZOLE 40 MG PO CPDR: 40.0000 mg | DELAYED_RELEASE_CAPSULE | Freq: Every day | ORAL | 2 refills | Status: DC

## 2017-01-21 MED ORDER — DICLOFENAC SODIUM 1 % TD GEL: 4.0000 g | Freq: Four times a day (QID) | TRANSDERMAL | 1 refills | Status: DC | PRN

## 2017-01-21 MED ORDER — DICLOFENAC SODIUM 1 % TD GEL: 4.0000 g | Freq: Four times a day (QID) | TRANSDERMAL | Status: DC | PRN

## 2017-01-21 NOTE — Progress Notes (Signed)
   CC: GERD follow up     HPI:  [name] is a 31 y.o. female with past medical history outlined below here for GERD follow up. For the details of today's visit, please refer to the assessment and plan.  Past Medical History:  Diagnosis Date  . Hypertension   . UTI (lower urinary tract infection)     Review of Systems:  All pertinents listed in HPI, otherwise negative  Physical Exam:  Vitals:   01/21/17 1004  BP: 133/78  Pulse: 67  Temp: 98.3 F (36.8 C)  TempSrc: Oral  SpO2: 100%  Weight: 216 lb 9.6 oz (98.2 kg)  Height:  (1.702 m)    Constitutional: NAD, appears comfortable Cardiovascular: RRR, no murmurs, rubs, or gallops.  Pulmonary/Chest: CTAB, no wheezes, rales, or rhonchi.  Abdominal: Soft, non tender, non distended. +BS.  Extremities: Warm and well perfused. No edema.  Neurological: A&Ox3, CN II - XII grossly intact.   Assessment & Plan:   See Encounters Tab for problem based charting.  Patient discussed with Dr. Heide Spark

## 2017-01-21 NOTE — Progress Notes (Addendum)
Depo Provera injection administered as scheduled.  Pt tolerated well.  Pt states she sees her PCP regularly and was seen earlier today with normal BP.  She is taking prescribed antihypertensive medication as prescribed.

## 2017-01-21 NOTE — Assessment & Plan Note (Signed)
Patient endorses chronic recurrent knee pain that is worse with activity, especially walking up stairs. She reports an accident where she fell and injured that knee many years ago. She has had mild intermittent knee pain since the incident. At times she feels that her knee is weak and almost gives out on her. She has not tried anything for the pain. Today we discussed conservative measures with physical therapy, knee bracing for support, and NSAIDs. If patient fails to improve with conservative therapy, consider imaging and/or sports medicine referral. -- Encouraged patient to buy supportive knee brace  -- Voltaren gel 4 times daily as needed for pain  -- Over-the-counter ibuprofen or naproxen as needed -- Physical therapy referral -- Follow-up as needed

## 2017-01-21 NOTE — Patient Instructions (Addendum)
Raven Webb,  It was a pleasure to see today. I have sent a refill of your Prilosec to your pharmacy. Please continue taking this medication daily. For your knee pain, I have referred you to physical therapy for strengthening exercises. They will contact you for an appointment. I have also sent a prescription for Voltaren gel. This is a topical pain medication that you can apply to your knee up to 4 times daily as needed. I would also recommend trying an over-the-counter knee brace for support. You may take over-the-counter ibuprofen or naproxen for additional pain relief if needed. Please follow up in 6 months or sooner if you need [place]. If you have any questions or concerns, call our clinic at [phone] or after hours call [phone] and ask for the internal medicine resident on call. Thank you!  - Dr. Antony Contras

## 2017-01-21 NOTE — Assessment & Plan Note (Addendum)
Patient was seen in the ED 1 month ago for atypical chest pain. Her presentation at that time was most consistent with reflux and she was discharged from the ED on a trial of Prilosec. Patient reports she has been taking this medication daily with complete resolution of her symptoms. She has run out of her prescription and is requesting a refill.  -- Refilled omeprazole 40 mg daily

## 2017-01-22 NOTE — Progress Notes (Signed)
Internal Medicine Clinic Attending  Case discussed with Dr. Guilloud at the time of the visit.  We reviewed the resident's history and exam and pertinent patient test results.  I agree with the assessment, diagnosis, and plan of care documented in the resident's note.  

## 2017-02-11 ENCOUNTER — Encounter: Payer: Self-pay | Admitting: *Deleted

## 2017-03-17 NOTE — Addendum Note (Signed)
Addended by: Neomia DearPOWERS, Bexley Mclester E on: 03/17/2017 07:32 AM   Modules accepted: Orders

## 2017-04-02 ENCOUNTER — Ambulatory Visit (INDEPENDENT_AMBULATORY_CARE_PROVIDER_SITE_OTHER): Payer: Medicaid Other | Admitting: Internal Medicine

## 2017-04-02 ENCOUNTER — Encounter: Payer: Self-pay | Admitting: Internal Medicine

## 2017-04-02 VITALS — BP 136/88 | HR 71 | Temp 98.8°F | Ht 67.0 in | Wt 219.2 lb

## 2017-04-02 DIAGNOSIS — M545 Low back pain, unspecified: Secondary | ICD-10-CM | POA: Insufficient documentation

## 2017-04-02 MED ORDER — KETOROLAC TROMETHAMINE 30 MG/ML IJ SOLN
30.0000 mg | Freq: Once | INTRAMUSCULAR | Status: AC
  Administered 2017-04-02: 30 mg via INTRAMUSCULAR

## 2017-04-02 MED ORDER — CYCLOBENZAPRINE HCL 10 MG PO TABS: 10.0000 mg | ORAL_TABLET | Freq: Every evening | ORAL | 0 refills | Status: DC | PRN

## 2017-04-02 NOTE — Patient Instructions (Addendum)
Thank you for coming in today.   Your back pain is likely a muscle strain. The shot should help and you can also keep taking Ibuprofen (600 mg every 6-8 hours) or Tylenol (1000 mg every 6 hours) at home to help. This may stick around for up to a few weeks but come back if it isn't getting better

## 2017-04-02 NOTE — Progress Notes (Signed)
   CC: Back pain   HPI:  [name] is a 31 y.o. F with past medical history as detailed below who presents to the clinic with complaints of back pain.   The pain began two days after she bent down to pick something up, it is described as an aching, cramping pain and 8/10 in intensity. OTC NSAIDs have provided little relief. She feels the pain radiates along the side of her legs to her knees which she has chronic issues with. Denies numbness/tingling to LEs, saddle anesthesia, bladder or bowel incontinence, pain to her feet, LE weakness. She had similar pain about 1 year ago that resolved in 1-2 weeks. She is still able to go to class and performs ADLs.   Past Medical History:  Diagnosis Date  . GERD (gastroesophageal reflux disease)   . Hypertension   . UTI (lower urinary tract infection)    Review of Systems:  Review of Systems  Constitutional: Negative for fever.  Musculoskeletal: Positive for back pain.  Neurological: Negative for tingling, sensory change and focal weakness.     Physical Exam:  Vitals:   04/02/17 1449  BP: 136/88  Pulse: 71  Temp: 98.8 F (37.1 C)  TempSrc: Oral  SpO2: 100%  Weight: 219 lb 3.2 oz (99.4 kg)  Height: 5\' 7"  (1.702 m)   Physical Exam  Constitutional: She is oriented to person, place, and time. She appears well-developed and well-nourished.  Cardiovascular: Normal rate, regular rhythm and intact distal pulses.   Pulmonary/Chest: Effort normal and breath sounds normal.  Musculoskeletal:  Tenderness to palpation of bilateral lumbar paraspinal muscles, no significant midline tenderness, no stepoffs, 5/5 LE strength bilaterally  Neurological: She is alert and oriented to person, place, and time. She displays normal reflexes. No sensory deficit. She exhibits normal muscle tone.  Skin: Skin is warm and dry.    Assessment & Plan:   See Encounters Tab for problem based charting.  Patient seen with Dr. Oswaldo DoneVincent

## 2017-04-02 NOTE — Assessment & Plan Note (Signed)
Acute lower back pain in paraspinal musculature due to an inciting event of bending over consistent with muscle strain. She has no red flag symptoms or signs of neurologic deficits on exam and no yellow flag sx of functional impairment. No imaging or further workup is indicated at this time. Will treat conservatively as below  Plan -Encourage OTC NSAID, Tylenol use -Toradol 30 mg IM in clinic -Cyclobenzaprine 10 mg nightly prn, #10

## 2017-04-03 NOTE — Progress Notes (Signed)
Internal Medicine Clinic Attending  I saw and evaluated the patient.  I personally confirmed the key portions of the history and exam documented by Dr. Harden and I reviewed pertinent patient test results.  The assessment, diagnosis, and plan were formulated together and I agree with the documentation in the resident's note.  

## 2017-04-08 ENCOUNTER — Ambulatory Visit (INDEPENDENT_AMBULATORY_CARE_PROVIDER_SITE_OTHER): Payer: Medicaid Other

## 2017-04-08 VITALS — BP 143/78 | HR 76

## 2017-04-08 DIAGNOSIS — Z3042 Encounter for surveillance of injectable contraceptive: Secondary | ICD-10-CM | POA: Diagnosis present

## 2017-04-08 NOTE — Progress Notes (Signed)
Patient presented to the today for her depo-provera. Patient tolerated well and will follow up in three months. Patient stated she currently taking her hypertensive medication but she forgot this morning.

## 2017-04-09 ENCOUNTER — Other Ambulatory Visit: Payer: Self-pay

## 2017-04-09 DIAGNOSIS — I10 Essential (primary) hypertension: Secondary | ICD-10-CM

## 2017-04-09 NOTE — Telephone Encounter (Signed)
amLODipine (NORVASC) 5 MG tablet, refill request.

## 2017-04-10 MED ORDER — AMLODIPINE BESYLATE 5 MG PO TABS: 5.0000 mg | ORAL_TABLET | Freq: Every day | ORAL | 1 refills | Status: DC

## 2017-05-06 ENCOUNTER — Ambulatory Visit (INDEPENDENT_AMBULATORY_CARE_PROVIDER_SITE_OTHER): Payer: Medicaid Other | Admitting: Internal Medicine

## 2017-05-06 ENCOUNTER — Encounter: Payer: Self-pay | Admitting: Internal Medicine

## 2017-05-06 ENCOUNTER — Ambulatory Visit: Payer: Medicaid Other

## 2017-05-06 DIAGNOSIS — G43909 Migraine, unspecified, not intractable, without status migrainosus: Secondary | ICD-10-CM | POA: Diagnosis not present

## 2017-05-06 DIAGNOSIS — G43019 Migraine without aura, intractable, without status migrainosus: Secondary | ICD-10-CM

## 2017-05-06 DIAGNOSIS — M542 Cervicalgia: Secondary | ICD-10-CM

## 2017-05-06 MED ORDER — SUMATRIPTAN SUCCINATE 50 MG PO TABS: 50.0000 mg | ORAL_TABLET | Freq: Once | ORAL | 0 refills | Status: DC

## 2017-05-06 MED ORDER — PROCHLORPERAZINE MALEATE 10 MG PO TABS: 10.0000 mg | ORAL_TABLET | Freq: Two times a day (BID) | ORAL | 0 refills | Status: DC

## 2017-05-06 MED ORDER — SUMATRIPTAN SUCCINATE 50 MG PO TABS
50.0000 mg | ORAL_TABLET | ORAL | 0 refills | Status: DC | PRN
Start: 2017-05-06 — End: 2017-05-06

## 2017-05-06 NOTE — Assessment & Plan Note (Signed)
Patient has had a persistent headache for two weeks. Stated that the pain was worse with bright lights and loud sounds but denied nausea, vomiting, lacrimation, or a consistent pattern to their occurrence.   Patient given Imitrex and prochlorperazine. Informed to return if they fail to resolve or if they worsen. Patient verbally informed to return if she develops new symptoms associated with the pain or the pain medication.

## 2017-05-06 NOTE — Progress Notes (Signed)
   CC: "Headache for two weeks"  HPI:Ms.Rich FuchsMiyoshi J Ybarbo is a 31 y.o. female who presented with a two week history of headache. She stated that is is worse with bright lights, and loud sounds. Not relieved by ibuprofen, naproxen or tylenol. Relieved only by rest but does wake her from sleep at times. She is has never experienced such headaches in the past, but is concerned that they are related to her BP. The headaches are throbbing in nature, located in the front of her head above her eyes, 8/10 in intensity and typically bilateral but today it is worse on the right. The are temporal in that they are worse in the morning and evening but not as painful during a typical day or night. She stated that her BP was 163/103 today at home, but noted to be 129/79 in the clinic. Patient denied additional symptoms today including nausea and vomiting. Patient is able to sleep most of the night as they do not appear to be as severe at that time, but she will wake with a headache on some mornings that is worse than most.   Past Medical History:  Diagnosis Date  . GERD (gastroesophageal reflux disease)   . Hypertension   . UTI (lower urinary tract infection)    Review of Systems:  Review of Systems  Constitutional: Negative for chills, diaphoresis, fever, malaise/fatigue and weight loss.  HENT: Negative for ear pain, hearing loss, sinus pain and tinnitus.   Eyes: Positive for photophobia. Negative for pain.  Respiratory: Negative for cough and shortness of breath.   Cardiovascular: Negative for chest pain and palpitations.  Gastrointestinal: Negative for abdominal pain, diarrhea, heartburn and vomiting.  Neurological: Positive for headaches. Negative for dizziness, tingling, tremors, focal weakness, seizures, loss of consciousness and weakness.  Psychiatric/Behavioral: Negative for depression. The patient is not nervous/anxious.   All other systems reviewed and are negative.    Physical Exam:  Vitals:   05/06/17 1515 05/06/17 1550  BP: 129/79 131/73  Pulse: 79 81  Temp: 98.4 F (36.9 C)   TempSrc: Oral   SpO2: 99%   Weight: 216 lb 4.8 oz (98.1 kg)   Height: 5\' 8"  (1.727 m)    Physical Exam  Constitutional: She is oriented to person, place, and time. She appears well-developed and well-nourished. No distress.  HENT:  Head: Normocephalic and atraumatic.  Eyes: Pupils are equal, round, and reactive to light. EOM are normal. Right eye exhibits no discharge. Left eye exhibits no discharge.  Neck: Normal range of motion. Neck supple.  Cardiovascular: Normal rate and regular rhythm.   Musculoskeletal: Normal range of motion. She exhibits no edema.  Neurological: She is alert and oriented to person, place, and time. No cranial nerve deficit. She exhibits normal muscle tone. Coordination normal.  Skin: Skin is warm and dry. No rash noted. She is not diaphoretic. No erythema. No pallor.  Psychiatric: She has a normal mood and affect. Her behavior is normal. Judgment and thought content normal.  Nursing note and vitals reviewed.   Assessment & Plan:   See Encounters Tab for problem based charting.  Patient seen with Dr. Rogelia BogaButcher

## 2017-05-06 NOTE — Patient Instructions (Addendum)
Please take a single tablet of IMITREX (sumatriptan) as well as a dose of prochlorperazine (compazine) before lying down. Do not drive after taking these medications.  You may take an additional Imitrex tablet after 2 hours if the headache does not resolve. Please do not take more than 3-4 tablets in a  24 hour period.   Please take no more than two tablets of prochlorperazine daily.  If your headache/migraine worsens or fails to improve after completion of the medication, please notify the clinic.  Thank you for your visit today.

## 2017-05-08 NOTE — Progress Notes (Signed)
Internal Medicine Clinic Attending  I saw and evaluated the patient.  I personally confirmed the key portions of the history and exam documented by Dr. Harbrecht and I reviewed pertinent patient test results.  The assessment, diagnosis, and plan were formulated together and I agree with the documentation in the resident's note.  

## 2017-06-09 ENCOUNTER — Encounter: Payer: Self-pay | Admitting: Internal Medicine

## 2017-06-09 ENCOUNTER — Other Ambulatory Visit: Payer: Self-pay | Admitting: Internal Medicine

## 2017-06-09 ENCOUNTER — Ambulatory Visit (INDEPENDENT_AMBULATORY_CARE_PROVIDER_SITE_OTHER): Payer: Medicaid Other | Admitting: Internal Medicine

## 2017-06-09 VITALS — BP 140/89 | HR 93 | Temp 98.9°F | Ht 67.0 in | Wt 219.1 lb

## 2017-06-09 DIAGNOSIS — I10 Essential (primary) hypertension: Secondary | ICD-10-CM

## 2017-06-09 DIAGNOSIS — M25562 Pain in left knee: Principal | ICD-10-CM

## 2017-06-09 DIAGNOSIS — M542 Cervicalgia: Secondary | ICD-10-CM

## 2017-06-09 DIAGNOSIS — Z79899 Other long term (current) drug therapy: Secondary | ICD-10-CM | POA: Diagnosis not present

## 2017-06-09 DIAGNOSIS — Z8249 Family history of ischemic heart disease and other diseases of the circulatory system: Secondary | ICD-10-CM | POA: Diagnosis not present

## 2017-06-09 DIAGNOSIS — G8929 Other chronic pain: Secondary | ICD-10-CM

## 2017-06-09 MED ORDER — MENTHOL (TOPICAL ANALGESIC) 7.5 % EX PTCH: 1.0000 "application " | MEDICATED_PATCH | Freq: Two times a day (BID) | CUTANEOUS | 0 refills | Status: AC | PRN

## 2017-06-09 MED ORDER — CYCLOBENZAPRINE HCL 10 MG PO TABS: 10.0000 mg | ORAL_TABLET | Freq: Every evening | ORAL | 0 refills | Status: DC | PRN

## 2017-06-09 MED ORDER — AMLODIPINE BESYLATE 5 MG PO TABS: 10.0000 mg | ORAL_TABLET | Freq: Every day | ORAL | 1 refills | Status: DC

## 2017-06-09 NOTE — Patient Instructions (Signed)
We will try the amlodipine  for now and see how your blood pressure does.  Please call if you have trouble keeping it under control or have symptoms of high blood pressure that you mentioned during the visit.  Also please try and use the icy hot and a heating pad for your neck and the flexaril (cyclobenzaprine) if you need it but mainly at night because it causes drowziness.  Please don't use the flexaril and drive.

## 2017-06-09 NOTE — Assessment & Plan Note (Signed)
At the beginning of 2018 patient was started on amlodipine 5 mg daily and hydrochlorothiazide 25 mg daily.  March 2018 patient was found to be under better control and the hydrochlorothiazide was discontinued.  Recently in the past week or so the patient has had difficulty controlling her blood pressure. As a result she has been doubling her amlodipine dose to  and adding the 25 mg IV chlorothiazide she had left over, in some instances to get it under control.  She indicates that normally doubling her amlodipine dose is enough to get her blood pressure under control.  She has been getting readings at home of 165/110, 158/98.  She also checks it when she is at [facility] and has noticed some high readings there as well.  -we will increase her amlodipine to 10 mg per dayand reevaluate her hypertension in 1 month, with the understanding that if in the next weekon the 10 mg of amlodipine she is still having great difficulty controlling her blood pressure and getting readings in the 160/110 range to please let us know and we will adjust her medication as needed and/or have her come in at an earlier time to be reevaluated.

## 2017-06-09 NOTE — Progress Notes (Signed)
CC: neck pain left sided  HPI:  Raven Webb is a 31 y.o. is here to discuss the left neck pain that began the last few days described as a cramping throbbing pain in her left posterior neck along the trapezius muscle.  No alleviating factors, worse with movement and rotation of her head.  She also wants to discuss her hypertension, she has had some trouble keeping it under control for the last couple of days with readings of 164/110.  She has been doubling her dosage of norvasc  when this happens for a total of  and it seems to return her blood pressure to around 140/70.  She reports if the  doesn't bring it down she adds her left over HCTZ  and that will usually bring it down.  She denies any changes in diet or weight change. She has also had a dry cough since Friday but reports no fevers or sick contacts.   She refused the flu shot today but will think about it and possibly get it during her visit next month.    Please see A&P for status of the patient's chronic medical conditions  Past Medical History:  Diagnosis Date  . GERD (gastroesophageal reflux disease)   . Hypertension   . UTI (lower urinary tract infection)    Review of Systems:  ROS: Pulmonary: pt denies increased work of breathing, shortness of breath,  Cardiac: pt denies palpitations, chest pain,  Abdominal: pt denies abdominal pain, nausea, vomiting, or diarrhea  Physical Exam:  Vitals:   06/09/17 1443  BP: 140/89  Pulse: 93  Temp: 98.9 F (37.2 C)  TempSrc: Oral  SpO2: 100%  Weight: 219 lb 1.6 oz (99.4 kg)  Height:  (1.702 m)   Physical Exam  Constitutional: She is oriented to person, place, and time. She appears well-developed and well-nourished.  HENT:  Head: Normocephalic and atraumatic.  Neck: Muscular tenderness (L trapezius muscel from the occipital region to the scapula feels tight compared with the right and is tender to palpation and active rotation of the head, abduction of  arms greater than 90 degrees.) present. No tracheal tenderness present. No edema and no erythema present.    Cardiovascular: Normal rate, regular rhythm and normal heart sounds.  Exam reveals no gallop and no friction rub.   No murmur heard. Pulmonary/Chest: Effort normal and breath sounds normal. No respiratory distress. She has no wheezes.  Abdominal: Soft. Bowel sounds are normal. She exhibits no distension.  Neurological: She is alert and oriented to person, place, and time.  Skin: Skin is warm and dry.  Psychiatric: She has a normal mood and affect. Her behavior is normal.    Social History   Social History  . Marital status: Single    Spouse name: N/A  . Number of children: N/A  . Years of education: N/A   Occupational History  . Not on file.   Social History Main Topics  . Smoking status: Never Smoker  . Smokeless tobacco: Never Used  . Alcohol use No  . Drug use: No  . Sexual activity: Not on file   Other Topics Concern  . Not on file   Social History Narrative  . No narrative on file    Family History  Problem Relation Age of Onset  . Hypertension Mother   . Diabetes Mother   . Hypertension Father   . Hypertension Sister   . Diabetes Sister   . Hypertension Maternal Uncle   .  Kidney disease Maternal Uncle     Assessment & Plan:   See Encounters Tab for problem based charting.  Patient seen with Dr. Heide Spark

## 2017-06-09 NOTE — Assessment & Plan Note (Signed)
Looking back in the chart it seems this muscular pain has been a chronic but intermittent issue.  Today on exam her left trapezius muscle is overtly tightened compared with the right side and is tender to palpation. We discussed strategies to try and keep the muscle loose and pain-free.  She denied any kind of recent injury to the area or lifting more weight than she usually does. She does not exercise with weights.  -prescribed a short course of cyclobenzaprine. She has been on this in the past for her lower back with good result.  Instructed the patient that the this medication causes drowsiness and she should not operate a motor vehicle when taking this medication and in fact it is often better to take it at night as it will help with sleep and relieve the cramping pain she is experiencing. -Also prescribed icy hot patch 7.5%.

## 2017-06-11 ENCOUNTER — Telehealth: Payer: Self-pay | Admitting: Internal Medicine

## 2017-06-11 ENCOUNTER — Other Ambulatory Visit: Payer: Self-pay | Admitting: *Deleted

## 2017-06-11 NOTE — Telephone Encounter (Signed)
Pt calling back about her BP Refill Request.  Please call the patient.

## 2017-06-11 NOTE — Telephone Encounter (Signed)
[facility] requesting a refilled on  HydroCHLOROthiazide 25 MG. Please call the pharmacy back.

## 2017-06-12 ENCOUNTER — Other Ambulatory Visit: Payer: Self-pay | Admitting: *Deleted

## 2017-06-12 MED ORDER — HYDROCHLOROTHIAZIDE 25 MG PO TABS: 25.0000 mg | ORAL_TABLET | Freq: Every day | ORAL | 3 refills | Status: DC

## 2017-06-12 NOTE — Telephone Encounter (Signed)
Pt states her BP 9/20 was 157/116 took one of her moms HCTZ went down to 140/80 Today at 1635 it is 154/96 She would like the hctz filled

## 2017-06-12 NOTE — Telephone Encounter (Signed)
Pt states she checked bp 9/20 and it was 157/116 took her mother's hctz and it came down to 140/80 bp today at 1645 is 154/96 She would like hctz filled Please advise

## 2017-06-15 NOTE — Progress Notes (Signed)
Internal Medicine Clinic Attending  I saw and evaluated the patient.  I personally confirmed the key portions of the history and exam documented by Dr. Frances Furbish and I reviewed pertinent patient test results.  The assessment, diagnosis, and plan were formulated together and I agree with the documentation in the resident's note.  Of note, patient was intermittently taking amlodipine 10 mg and HCTZ 25 mg (not IV chlorthiazide) at home to help control her blood pressure

## 2017-06-24 ENCOUNTER — Ambulatory Visit (INDEPENDENT_AMBULATORY_CARE_PROVIDER_SITE_OTHER): Payer: Medicaid Other

## 2017-06-24 DIAGNOSIS — Z3042 Encounter for surveillance of injectable contraceptive: Secondary | ICD-10-CM | POA: Diagnosis present

## 2017-06-24 NOTE — Progress Notes (Signed)
Patient presented to the office for a Depo injection. Patient tolerated well.  

## 2017-06-24 NOTE — Progress Notes (Signed)
Agree with nursing staff's documentation of this patient's clinic encounter.  Dewight Catino, DO    

## 2017-09-09 ENCOUNTER — Ambulatory Visit (INDEPENDENT_AMBULATORY_CARE_PROVIDER_SITE_OTHER): Payer: Medicaid Other | Admitting: General Practice

## 2017-09-09 DIAGNOSIS — Z3042 Encounter for surveillance of injectable contraceptive: Secondary | ICD-10-CM | POA: Diagnosis present

## 2017-09-09 NOTE — Progress Notes (Signed)
Depo provera given. Patient's BP is elevated. Patient states that is normal for her- reports she is on BP medication

## 2017-10-21 ENCOUNTER — Other Ambulatory Visit: Payer: Self-pay | Admitting: Internal Medicine

## 2017-10-21 DIAGNOSIS — I10 Essential (primary) hypertension: Secondary | ICD-10-CM

## 2017-10-21 DIAGNOSIS — K219 Gastro-esophageal reflux disease without esophagitis: Secondary | ICD-10-CM

## 2017-11-24 ENCOUNTER — Ambulatory Visit (INDEPENDENT_AMBULATORY_CARE_PROVIDER_SITE_OTHER): Payer: Medicaid Other | Admitting: General Practice

## 2017-11-24 VITALS — BP 149/95 | HR 70 | Ht 67.0 in | Wt 217.0 lb

## 2017-11-24 DIAGNOSIS — Z3042 Encounter for surveillance of injectable contraceptive: Secondary | ICD-10-CM | POA: Diagnosis not present

## 2017-11-24 NOTE — Progress Notes (Signed)
Raven FuchsMiyoshi J Webb here for Depo-Provera  Injection.  Injection administered without complication. Patient will return in 3 months for next injection. Patient's BP was elevated. Per chart review, hasn't seen PCP since 05/2017. Recommended she follow up with PCP for high blood pressure & see a provider for an annual exam prior to next injection. Patient verbalized understanding & had no questions   Raven PearsonCarrie Hillman, RN 11/24/2017  2:50 PM

## 2017-11-25 ENCOUNTER — Ambulatory Visit: Payer: Medicaid Other

## 2017-11-25 NOTE — Progress Notes (Signed)
I have reviewed this chart and agree with the RN/CMA assessment and management.    K. Meryl Harvest Stanco, M.D. Attending Obstetrician & Gynecologist, [facility], [facility]  

## 2017-12-25 ENCOUNTER — Other Ambulatory Visit: Payer: Self-pay | Admitting: Internal Medicine

## 2018-02-09 ENCOUNTER — Ambulatory Visit (INDEPENDENT_AMBULATORY_CARE_PROVIDER_SITE_OTHER): Payer: Medicaid Other

## 2018-02-09 VITALS — BP 147/93 | HR 64 | Wt 217.3 lb

## 2018-02-09 DIAGNOSIS — Z3042 Encounter for surveillance of injectable contraceptive: Secondary | ICD-10-CM

## 2018-02-09 NOTE — Progress Notes (Signed)
Pt here today for Depo Provera 150 mg.  Pt tolerated well.   Pt scheduled for next injection.

## 2018-02-09 NOTE — Progress Notes (Signed)
Chart reviewed for nurse visit. Agree with plan of care.   Pincus Large, DO 02/09/2018 7:28 PM

## 2018-03-18 ENCOUNTER — Ambulatory Visit: Payer: Medicaid Other | Admitting: Internal Medicine

## 2018-03-18 ENCOUNTER — Encounter: Payer: Self-pay | Admitting: Internal Medicine

## 2018-03-18 ENCOUNTER — Other Ambulatory Visit: Payer: Self-pay

## 2018-03-18 VITALS — BP 138/89 | HR 80 | Temp 100.1°F | Ht 67.0 in | Wt 217.5 lb

## 2018-03-18 DIAGNOSIS — B349 Viral infection, unspecified: Secondary | ICD-10-CM | POA: Diagnosis not present

## 2018-03-18 DIAGNOSIS — I10 Essential (primary) hypertension: Secondary | ICD-10-CM | POA: Diagnosis not present

## 2018-03-18 MED ORDER — FLUTICASONE PROPIONATE 50 MCG/ACT NA SUSP: 2.0000 | Freq: Every day | NASAL | 2 refills | Status: DC

## 2018-03-18 MED ORDER — MELOXICAM 7.5 MG PO TABS: 7.5000 mg | ORAL_TABLET | Freq: Every day | ORAL | 2 refills | Status: DC

## 2018-03-18 NOTE — Patient Instructions (Signed)
Thank you for coming to see me today. It was a pleasure. Today we talked about:   Viral illness: I think you have a viral illness causing you to feel poorly.  Hopefully, you will start to better over the weekend.  To treat your symptoms (as well as shoulder pain), I have prescribed you Meloxicam for pain and Flonase nasal spray for sinus congestion.  Continue to take Tylenol as needed but stop taking ibuprofen.  You can also use saline nasal irrigation and throat lozenges for your sore throat  Please follow-up with us  in 6 months or sooner if needed.  We are checking labs today.  I 'll let you know if anything is abnormal .  If you have any questions or concerns, please do not hesitate to call the office at [phone].  Take Care,   Gwynn BurlyAndrew Oluwadamilola Rosamond, DO

## 2018-03-18 NOTE — Progress Notes (Signed)
   CC: f/u HTN and acute complaint of sore throat and ear ache  HPI:  [name] is a 32 y.o. woman with a past medical history listed below here today for follow up of her HTN and acute complaint of sore throat and ear ache.  For details of today's visit and the status of her chronic medical issues please refer to the assessment and plan.   Past Medical History:  Diagnosis Date  . GERD (gastroesophageal reflux disease)   . Hypertension   . UTI (lower urinary tract infection)    Review of Systems:  Please see pertinent ROS reviewed in HPI and problem based charting.   Physical Exam:  Vitals:   03/18/18 1330  BP: 138/89  Pulse: 80  Temp: 100.1 F (37.8 C)  TempSrc: Oral  SpO2: 100%  Weight: 217 lb 8 oz (98.7 kg)  Height: 5\' 7"  (1.702 m)   Physical Exam  Constitutional: She is oriented to person, place, and time and well-developed, well-nourished, and in no distress.  HENT:  Head: Normocephalic and atraumatic.  Mouth/Throat: No oropharyngeal exudate.  Right TM clear without erythema or bulging. Left TM without erythema but does have fullness. No sinus tenderness.   Eyes: Conjunctivae and EOM are normal.  Neck: Normal range of motion. Neck supple.  Cardiovascular: Normal rate and regular rhythm.  Pulmonary/Chest: Effort normal and breath sounds normal. She has no wheezes. She has no rales.  Musculoskeletal: Normal range of motion. She exhibits no tenderness.  Mild trapezius tenderness on the left.  No obvious MSK deformity and strength 5/5 with shoulder shrug, grip strength and proximal strength testing.   Lymphadenopathy:    She has no cervical adenopathy.  Neurological: She is alert and oriented to person, place, and time.  Skin: Skin is warm and dry.  Psychiatric: Mood and affect normal.     Assessment & Plan:   See Encounters Tab for problem based charting.  Patient discussed with Dr. Cleda DaubE. Hoffman.  Viral illness She has acute complaint today of sore  throat, ear ache on left, and headache for 2 days.  She also reports low-grade fever of 99-100.  Her temperature in clinic today is 100.1.  Other symptoms include myalgias, nausea, fatigue, and left shoulder pain.  The shoulder pain is more chronic and is described as dull and achy.  She has attempted relief with ibuprofen and tylenol with only temporary relief.  She further denies any vomiting, cough, or sick contacts.  She also reports no recent sexual activity to suggest acute STD infection.  Plan: - Her constellation of symptoms appears most consistent with a viral illness that will most likely be self limiting - Discussed supportive care and recommended continued NSAID and Tylenol as needed.  Will provide prescription for Mobic in place of Ibuprofen.  Also, recommended Flonase, frequent saline irrigation for eustachian tube dysfunction and throat lozenges for her sore throat. - Recommended returning to clinic next week if not improving.  HYPERTENSION, BENIGN ESSENTIAL BP today is under reasonable control on current 2 drug regimen of thiazide diuretic and CCB.  Plan: - Continue HCTZ 25mg  daily - Continue amlodipine 10mg  daily - BMET today with stable electrolytes and renal function

## 2018-03-19 ENCOUNTER — Encounter: Payer: Self-pay | Admitting: Internal Medicine

## 2018-03-19 DIAGNOSIS — B349 Viral infection, unspecified: Secondary | ICD-10-CM | POA: Insufficient documentation

## 2018-03-19 LAB — BMP8+ANION GAP
ANION GAP: 15 mmol/L (ref 10.0–18.0)
BUN/Creatinine Ratio: 8 — ABNORMAL LOW (ref 9–23)
BUN: 7 mg/dL (ref 6–20)
CALCIUM: 9.5 mg/dL (ref 8.7–10.2)
CO2: 22 mmol/L (ref 20–29)
CREATININE: 0.91 mg/dL (ref 0.57–1.00)
Chloride: 104 mmol/L (ref 96–106)
GFR calc Af Amer: 98 mL/min/{1.73_m2} (ref >59)
GFR, EST NON AFRICAN AMERICAN: 85 mL/min/{1.73_m2} (ref >59)
Glucose: 83 mg/dL (ref 65–99)
POTASSIUM: 3.9 mmol/L (ref 3.5–5.2)
Sodium: 141 mmol/L (ref 134–144)

## 2018-03-19 NOTE — Assessment & Plan Note (Signed)
BP today is under reasonable control on current 2 drug regimen of thiazide diuretic and CCB.  Plan: - Continue HCTZ 25mg  daily - Continue amlodipine 10mg  daily - BMET today with stable electrolytes and renal function

## 2018-03-19 NOTE — Assessment & Plan Note (Signed)
She has acute complaint today of sore throat, ear ache on left, and headache for 2 days.  She also reports low-grade fever of 99-100.  Her temperature in clinic today is 100.1.  Other symptoms include myalgias, nausea, fatigue, and left shoulder pain.  The shoulder pain is more chronic and is described as dull and achy.  She has attempted relief with ibuprofen and tylenol with only temporary relief.  She further denies any vomiting, cough, or sick contacts.  She also reports no recent sexual activity to suggest acute STD infection.  Plan: - Her constellation of symptoms appears most consistent with a viral illness that will most likely be self limiting - Discussed supportive care and recommended continued NSAID and Tylenol as needed.  Will provide prescription for Mobic in place of Ibuprofen.  Also, recommended Flonase, frequent saline irrigation for eustachian tube dysfunction and throat lozenges for her sore throat. - Recommended returning to clinic next week if not improving.

## 2018-03-20 ENCOUNTER — Encounter: Payer: Self-pay | Admitting: Internal Medicine

## 2018-03-20 ENCOUNTER — Encounter: Payer: Self-pay | Admitting: *Deleted

## 2018-03-22 NOTE — Progress Notes (Signed)
Internal Medicine Clinic Attending  Case discussed with Dr. Wallace at the time of the visit.  We reviewed the resident's history and exam and pertinent patient test results.  I agree with the assessment, diagnosis, and plan of care documented in the resident's note.  

## 2018-03-27 DIAGNOSIS — H16223 Keratoconjunctivitis sicca, not specified as Sjogren's, bilateral: Secondary | ICD-10-CM | POA: Diagnosis not present

## 2018-04-16 ENCOUNTER — Other Ambulatory Visit: Payer: Self-pay | Admitting: Internal Medicine

## 2018-04-16 DIAGNOSIS — K219 Gastro-esophageal reflux disease without esophagitis: Secondary | ICD-10-CM

## 2018-04-27 ENCOUNTER — Other Ambulatory Visit (HOSPITAL_COMMUNITY)
Admission: RE | Admit: 2018-04-27 | Discharge: 2018-04-27 | Disposition: A | Discharge status: Home / Self Care | Payer: Medicaid Other | Source: Ambulatory Visit | Attending: Nurse Practitioner | Admitting: Nurse Practitioner

## 2018-04-27 ENCOUNTER — Ambulatory Visit: Payer: Medicaid Other

## 2018-04-27 ENCOUNTER — Encounter: Payer: Self-pay | Admitting: Nurse Practitioner

## 2018-04-27 ENCOUNTER — Ambulatory Visit (INDEPENDENT_AMBULATORY_CARE_PROVIDER_SITE_OTHER): Payer: Medicaid Other | Admitting: Nurse Practitioner

## 2018-04-27 VITALS — BP 153/102 | HR 77 | Ht 67.0 in | Wt 216.0 lb

## 2018-04-27 DIAGNOSIS — K219 Gastro-esophageal reflux disease without esophagitis: Secondary | ICD-10-CM | POA: Diagnosis not present

## 2018-04-27 DIAGNOSIS — Z01419 Encounter for gynecological examination (general) (routine) without abnormal findings: Secondary | ICD-10-CM | POA: Diagnosis not present

## 2018-04-27 DIAGNOSIS — E669 Obesity, unspecified: Secondary | ICD-10-CM | POA: Insufficient documentation

## 2018-04-27 DIAGNOSIS — I10 Essential (primary) hypertension: Secondary | ICD-10-CM | POA: Insufficient documentation

## 2018-04-27 DIAGNOSIS — Z0000 Encounter for general adult medical examination without abnormal findings: Secondary | ICD-10-CM | POA: Diagnosis not present

## 2018-04-27 DIAGNOSIS — Z3042 Encounter for surveillance of injectable contraceptive: Secondary | ICD-10-CM

## 2018-04-27 DIAGNOSIS — Z79899 Other long term (current) drug therapy: Secondary | ICD-10-CM | POA: Diagnosis not present

## 2018-04-27 DIAGNOSIS — Z6830 Body mass index (BMI) 30.0-30.9, adult: Secondary | ICD-10-CM | POA: Diagnosis not present

## 2018-04-27 DIAGNOSIS — Z7951 Long term (current) use of inhaled steroids: Secondary | ICD-10-CM | POA: Insufficient documentation

## 2018-04-27 MED ORDER — MEDROXYPROGESTERONE ACETATE 150 MG/ML IM SUSP
150.0000 mg | Freq: Once | INTRAMUSCULAR | Status: AC
  Administered 2018-04-27: 150 mg via INTRAMUSCULAR

## 2018-04-27 NOTE — Addendum Note (Signed)
Addended by: Cheree DittoGRAHAM, Jakki Doughty A on: 04/27/2018 05:45 PM   Modules accepted: Orders

## 2018-04-27 NOTE — Progress Notes (Signed)
GYNECOLOGY ANNUAL PREVENTATIVE CARE ENCOUNTER NOTE  Subjective:   Raven Webb is a 32 y.o. [phone] female here for a routine annual gynecologic exam.  Current complaints: no problems with Depo.   Reviewed problem list and medication list.  Denies abnormal vaginal bleeding, discharge, pelvic pain, problems with intercourse or other gynecologic concerns.   Reports no smoking.   Gynecologic History No LMP recorded. Patient has had an injection. Contraception: Depo-Provera injections Last Pap: 10-24-2016. Results were: normal Last mammogram: NA  Obstetric History OB History  Gravida Para Term Preterm AB Living  4 3 1 2   3   SAB TAB Ectopic Multiple Live Births               # Outcome Date GA Lbr Len/2nd Weight Sex Delivery Anes PTL Lv  4 Gravida           3 Term           2 Preterm           1 Preterm             Past Medical History:  Diagnosis Date  . GERD (gastroesophageal reflux disease)   . Hypertension   . UTI (lower urinary tract infection)     Past Surgical History:  Procedure Laterality Date  . WISDOM TOOTH EXTRACTION      Current Outpatient Medications on File Prior to Visit  Medication Sig Dispense Refill  . amLODipine (NORVASC) 5 MG tablet Take 2 tablets (10 mg total) by mouth daily. 90 tablet 1  . fluticasone (FLONASE) 50 MCG/ACT nasal spray Place 2 sprays into both nostrils daily. 16 g 2  . hydrochlorothiazide (HYDRODIURIL) 25 MG tablet TAKE ONE TABLET BY MOUTH ONCE DAILY 90 tablet 0  . meloxicam (MOBIC) 7.5 MG tablet Take 1 tablet (7.5 mg total) by mouth daily. 30 tablet 2  . omeprazole (PRILOSEC) 40 MG capsule TAKE ONE CAPSULE BY MOUTH ONCE DAILY 30 capsule 2  . VOLTAREN 1 % GEL APPLY 4 GRAMS TO AFFECTED AREAS FOUR TIMES A DAY AS NEEDED FOR KNEE PAIN 100 g 0   Current Facility-Administered Medications on File Prior to Visit  Medication Dose Route Frequency Provider Last Rate Last Dose  . medroxyPROGESTERone (DEPO-PROVERA) injection 150 mg  150 mg  Intramuscular Q90 days Anyanwu, Jethro Bastos, MD   150 mg at 02/09/18 1639    No Known Allergies  Social History   Socioeconomic History  . Marital status: Single    Spouse name: Not on file  . Number of children: Not on file  . Years of education: Not on file  . Highest education level: Not on file  Occupational History  . Not on file  Social Needs  . Financial resource strain: Not on file  . Food insecurity:    Worry: Not on file    Inability: Not on file  . Transportation needs:    Medical: Not on file    Non-medical: Not on file  Tobacco Use  . Smoking status: Never Smoker  . Smokeless tobacco: Never Used  Substance and Sexual Activity  . Alcohol use: No    Alcohol/week: 0.0 oz  . Drug use: No  . Sexual activity: Not on file  Lifestyle  . Physical activity:    Days per week: Not on file    Minutes per session: Not on file  . Stress: Not on file  Relationships  . Social connections:    Talks on phone: Not on  file    Gets together: Not on file    Attends religious service: Not on file    Active member of club or organization: Not on file    Attends meetings of clubs or organizations: Not on file    Relationship status: Not on file  . Intimate partner violence:    Fear of current or ex partner: Not on file    Emotionally abused: Not on file    Physically abused: Not on file    Forced sexual activity: Not on file  Other Topics Concern  . Not on file  Social History Narrative  . Not on file    Family History  Problem Relation Age of Onset  . Hypertension Mother   . Diabetes Mother   . Hypertension Father   . Hypertension Sister   . Diabetes Sister   . Hypertension Maternal Uncle   . Kidney disease Maternal Uncle     The following portions of the patient's history were reviewed and updated as appropriate: allergies, current medications, past family history, past medical history, past social history, past surgical history and problem list.  Review of  Systems Pertinent items noted in HPI and remainder of comprehensive ROS otherwise negative.   Objective:  BP (!) 153/102   Pulse 77   Ht 5\' 7"  (1.702 m)   Wt 216 lb (98 kg)   BMI 33.83 kg/m  CONSTITUTIONAL: Well-developed, well-nourished female in no acute distress.  HENT:  Normocephalic, atraumatic, External right and left ear normal.  EYES: Conjunctivae and EOM are normal. Pupils are equal, round.  No scleral icterus.  NECK: Normal range of motion, supple, no masses.  Normal thyroid.  SKIN: Skin is warm and dry. No rash noted. Not diaphoretic. No erythema. No pallor. NEUROLOGIC: Alert and oriented to person, place, and time. Normal reflexes, muscle tone coordination. No cranial nerve deficit noted. PSYCHIATRIC: Normal mood and affect. Normal behavior. Normal judgment and thought content. CARDIOVASCULAR: Normal heart rate noted, regular rhythm RESPIRATORY: Clear to auscultation bilaterally. Effort and breath sounds normal, no problems with respiration noted. BREASTS: Symmetric in size. No masses, skin changes, nipple drainage, or lymphadenopathy. ABDOMEN: Soft, no distention noted.  No tenderness, rebound or guarding.  PELVIC: Normal appearing external genitalia; normal appearing vaginal mucosa and cervix.  No abnormal discharge noted.  Pap smear obtained.  Normal uterine size, no other palpable masses, no uterine or adnexal tenderness. MUSCULOSKELETAL: Normal range of motion. No tenderness.  No cyanosis, clubbing, or edema.    Assessment and Plan:  1. Encounter for annual routine gynecological examination Has a PCP and will plan to get TDAP and flu vaccine there  2. Encounter for Depo-Provera contraception Given today and will return in 3 months - medroxyPROGESTERone (DEPO-PROVERA) injection 150 mg  3. Obesity (BMI 30.0-34.9) Advised weight loss to BMI under 25 or under 160 pounds Encouraged walking for 10 minutes 5 days a week and then increase for her best health  4.  HYPERTENSION, BENIGN ESSENTIAL On her meds - 2- maintains contact with her MD  Will follow up results of pap smear and manage accordingly. Mammogram at age 89 Routine preventative health maintenance measures emphasized. Please refer to After Visit Summary for other counseling recommendations.    Nolene BernheimERRI BURLESON, RN, MSN, NP-BC Nurse Practitioner, [facility]

## 2018-04-29 LAB — CYTOLOGY - PAP
Chlamydia: NEGATIVE
Diagnosis: NEGATIVE
HPV: NOT DETECTED
Neisseria Gonorrhea: NEGATIVE
Trichomonas: NEGATIVE

## 2018-05-18 ENCOUNTER — Other Ambulatory Visit: Payer: Self-pay | Admitting: Internal Medicine

## 2018-07-13 ENCOUNTER — Ambulatory Visit (INDEPENDENT_AMBULATORY_CARE_PROVIDER_SITE_OTHER): Payer: Medicaid Other | Admitting: *Deleted

## 2018-07-13 VITALS — BP 146/90 | HR 86 | Ht 67.0 in | Wt 216.0 lb

## 2018-07-13 DIAGNOSIS — Z3042 Encounter for surveillance of injectable contraceptive: Secondary | ICD-10-CM

## 2018-07-13 NOTE — Progress Notes (Signed)
Chart reviewed for nurse visit. Agree with plan of care.   Marylene Land, CNM 07/13/2018 6:53 PM

## 2018-07-13 NOTE — Progress Notes (Signed)
Depo Provera 150 mg administered as scheduled. Pt tolerated well. Next injection due 09/28/18-10/12/18. BP elevated today however is consistent with previous reading. Pt states last visit w/PCP was 03/18/18 and does not have future visit scheduled.  She reports still taking Norvasc 10 mg daily and HCTZ 25 mg daily. She denies headaches or visual disturbances. Pt was encouraged to schedule follow up with PCP no later than December. Annual Gyn exam due on 04/28/19. Pt voiced understanding of all information and instructions given.

## 2018-07-19 ENCOUNTER — Other Ambulatory Visit: Payer: Self-pay | Admitting: Internal Medicine

## 2018-07-31 ENCOUNTER — Encounter (HOSPITAL_COMMUNITY): Payer: Self-pay | Admitting: *Deleted

## 2018-07-31 ENCOUNTER — Ambulatory Visit (HOSPITAL_COMMUNITY)
Admission: EM | Admit: 2018-07-31 | Discharge: 2018-07-31 | Disposition: A | Discharge status: Home / Self Care | Payer: Medicaid Other | Attending: Family Medicine | Admitting: Family Medicine

## 2018-07-31 ENCOUNTER — Ambulatory Visit (INDEPENDENT_AMBULATORY_CARE_PROVIDER_SITE_OTHER): Payer: Medicaid Other

## 2018-07-31 DIAGNOSIS — I10 Essential (primary) hypertension: Secondary | ICD-10-CM

## 2018-07-31 DIAGNOSIS — S92514A Nondisplaced fracture of proximal phalanx of right lesser toe(s), initial encounter for closed fracture: Secondary | ICD-10-CM

## 2018-07-31 DIAGNOSIS — W228XXA Striking against or struck by other objects, initial encounter: Secondary | ICD-10-CM | POA: Diagnosis not present

## 2018-07-31 MED ORDER — ACETAMINOPHEN ER 650 MG PO TBCR: 650.0000 mg | EXTENDED_RELEASE_TABLET | Freq: Three times a day (TID) | ORAL | 0 refills | Status: DC | PRN

## 2018-07-31 MED ORDER — MELOXICAM 7.5 MG PO TABS: 7.5000 mg | ORAL_TABLET | Freq: Every day | ORAL | 0 refills | Status: DC

## 2018-07-31 NOTE — Discharge Instructions (Signed)
As discussed, toe fracture to your right pinky toe.  This can take up to 4 to 8 weeks to completely heal. Start Mobic. Do not take ibuprofen (motrin/advil)/ naproxen (aleve) while on mobic.  Tylenol for breakthrough pain.  Ice compress, elevation, postop boot during activity.  As discussed, given this is not a great toe, you do not have to follow-up with orthopedics, but you can follow-up with your primary care for further monitoring needed.

## 2018-07-31 NOTE — ED Provider Notes (Signed)
[facility]    CSN: [id number] Arrival date & time: 07/31/18  1016     History   Chief Complaint Chief Complaint  Patient presents with  . Toe Injury    HPI Raven Webb is a 32 y.o. female.   32 year old female comes in for continued right fifth toe pain after injury 5 days ago.  States accidentally jammed her toe twice on the same day.  Area is swollen with pain and numbness/tingling.  She has been applying ice, elevating, taking ibuprofen without relief.  Walking with a limp due to the pain.     Past Medical History:  Diagnosis Date  . GERD (gastroesophageal reflux disease)   . Hypertension   . UTI (lower urinary tract infection)     Patient Active Problem List   Diagnosis Date Noted  . Obesity (BMI 30.0-34.9) 04/27/2018  . Viral illness 03/19/2018  . Migraine 05/06/2017  . Knee pain, chronic 04/25/2016  . GERD (gastroesophageal reflux disease) 03/31/2013  . PALPITATIONS 04/30/2009  . HYPERTENSION, BENIGN ESSENTIAL 03/13/2008  . ASCUS PAP 07/14/2007    Past Surgical History:  Procedure Laterality Date  . WISDOM TOOTH EXTRACTION      OB History    Gravida  4   Para  3   Term  1   Preterm  2   AB      Living  3     SAB      TAB      Ectopic      Multiple      Live Births               Home Medications    Prior to Admission medications   Medication Sig Start Date End Date Taking? Authorizing Provider  amLODipine (NORVASC) 5 MG tablet Take 2 tablets (10 mg total) by mouth daily. 10/23/17  Yes Gwynn Burly, DO  hydrochlorothiazide (HYDRODIURIL) 25 MG tablet TAKE ONE TABLET BY MOUTH ONCE DAILY 07/21/18  Yes Ali Lowe, MD  omeprazole (PRILOSEC) 40 MG capsule TAKE ONE CAPSULE BY MOUTH ONCE DAILY 04/16/18  Yes Anne Shutter, MD  acetaminophen (TYLENOL 8 HOUR) 650 MG CR tablet Take 1 tablet (650 mg total) by mouth every 8 (eight) hours as needed for pain. 07/31/18   Cathie Hoops,  V, PA-C  meloxicam (MOBIC) 7.5 MG tablet  Take 1 tablet (7.5 mg total) by mouth daily. 07/31/18   Belinda Fisher, PA-C    Family History Family History  Problem Relation Age of Onset  . Hypertension Mother   . Diabetes Mother   . Hypertension Father   . Hypertension Sister   . Diabetes Sister   . Hypertension Maternal Uncle   . Kidney disease Maternal Uncle     Social History Social History   Tobacco Use  . Smoking status: Never Smoker  . Smokeless tobacco: Never Used  Substance Use Topics  . Alcohol use: No    Alcohol/week: 0.0 standard drinks  . Drug use: No     Allergies   Patient has no known allergies.   Review of Systems Review of Systems  Reason unable to perform ROS: See HPI as above.     Physical Exam Triage Vital Signs ED Triage Vitals  Enc Vitals Group     BP 07/31/18 1108 (!) 161/90     Pulse Rate 07/31/18 1108 72     Resp 07/31/18 1108 16     Temp 07/31/18 1108 97.9 F (36.6 C)  Temp Source 07/31/18 1108 Oral     SpO2 07/31/18 1108 100 %     Weight --      Height --      Head Circumference --      Peak Flow --      Pain Score 07/31/18 1109 8     Pain Loc --      Pain Edu? --      Excl. in GC? --    No data found.  Updated Vital Signs BP (!) 161/90   Pulse 72   Temp 97.9 F (36.6 C) (Oral)   Resp 16   SpO2 100%   Breastfeeding? No Comment: Depo injection  Physical Exam  Constitutional: She is oriented to person, place, and time. She appears well-developed and well-nourished. No distress.  HENT:  Head: Normocephalic and atraumatic.  Eyes: Pupils are equal, round, and reactive to light. Conjunctivae are normal.  Musculoskeletal:  Diffuse swelling to the right fifth toe, no contusion, erythema, warmth.  No subungual hematoma.  Tenderness to palpation diffusely of the fifth toe, and distal MTP.  Sensation intact.  Pedal pulse 2+, cap refill less than 2 seconds.  Neurological: She is alert and oriented to person, place, and time.  Skin: She is not diaphoretic.    UC  Treatments / Results  Labs (all labs ordered are listed, but only abnormal results are displayed) Labs Reviewed - No data to display  EKG None  Radiology Dg Foot Complete Right  Result Date: 07/31/2018 CLINICAL DATA:  Patient with pain in the little toe after injury to the door jam. Initial encounter. EXAM: RIGHT FOOT COMPLETE - 3+ VIEW COMPARISON:  None. FINDINGS: Nondisplaced oblique fracture through the proximal phalanx of the digit. Overlying soft tissue swelling. First MTP joint degenerative changes. No evidence for associated acute fractures. IMPRESSION: Nondisplaced oblique fracture through the proximal phalanx of the fifth digit. Electronically Signed   By: Annia Belt M.D.   On: 07/31/2018 11:42    Procedures Procedures (including critical care time)  Medications Ordered in UC Medications - No data to display  Initial Impression / Assessment and Plan / UC Course  I have reviewed the triage vital signs and the nursing notes.  Pertinent labs & imaging results that were available during my care of the patient were reviewed by me and considered in my medical decision making (see chart for details).    X-ray results discussed with patient. Mobic as directed.  Tylenol for breakthrough pain.  Patient declined narcotic prescription. Discussed anticipated healing for fractures 4-6 weeks. Given lesser toe, can monitor with PCP. Return precautions given. Patient expresses understanding and agrees to plan.  Final Clinical Impressions(s) / UC Diagnoses   Final diagnoses:  Closed nondisplaced fracture of proximal phalanx of lesser toe of right foot, initial encounter    ED Prescriptions    Medication Sig Dispense Auth. Provider   meloxicam (MOBIC) 7.5 MG tablet Take 1 tablet (7.5 mg total) by mouth daily. 15 tablet ,  V, PA-C   acetaminophen (TYLENOL 8 HOUR) 650 MG CR tablet Take 1 tablet (650 mg total) by mouth every 8 (eight) hours as needed for pain. 30 tablet Threasa Alpha, PA-C 07/31/18 1200

## 2018-07-31 NOTE — ED Triage Notes (Signed)
Reports jamming right 5th toe twice 5 days ago.  Continues with pain, ambulating with limp, and feels "a knot" to the bone.

## 2018-08-02 ENCOUNTER — Telehealth (HOSPITAL_COMMUNITY): Payer: Self-pay | Admitting: Emergency Medicine

## 2018-08-03 ENCOUNTER — Ambulatory Visit (INDEPENDENT_AMBULATORY_CARE_PROVIDER_SITE_OTHER): Payer: Medicaid Other | Admitting: Internal Medicine

## 2018-08-03 ENCOUNTER — Other Ambulatory Visit: Payer: Self-pay

## 2018-08-03 VITALS — BP 151/94 | HR 69 | Temp 98.7°F | Ht 67.0 in | Wt 225.3 lb

## 2018-08-03 DIAGNOSIS — I10 Essential (primary) hypertension: Secondary | ICD-10-CM

## 2018-08-03 DIAGNOSIS — S92514D Nondisplaced fracture of proximal phalanx of right lesser toe(s), subsequent encounter for fracture with routine healing: Secondary | ICD-10-CM | POA: Diagnosis not present

## 2018-08-03 DIAGNOSIS — Z0000 Encounter for general adult medical examination without abnormal findings: Secondary | ICD-10-CM

## 2018-08-03 DIAGNOSIS — Z79899 Other long term (current) drug therapy: Secondary | ICD-10-CM

## 2018-08-03 DIAGNOSIS — W228XXD Striking against or struck by other objects, subsequent encounter: Secondary | ICD-10-CM

## 2018-08-03 DIAGNOSIS — S92901A Unspecified fracture of right foot, initial encounter for closed fracture: Secondary | ICD-10-CM

## 2018-08-03 MED ORDER — OXYCODONE-ACETAMINOPHEN 7.5-325 MG PO TABS: 1.0000 | ORAL_TABLET | ORAL | 0 refills | Status: AC | PRN

## 2018-08-03 NOTE — Patient Instructions (Addendum)
Thank you for allowing us to provide your care today. Today we discussed your foot fracture and work paperwork.    Today we made the following changes to your medications.   Your can take the percocet for severe pain for the next 5 days Please follow up in 2 days to have your TB test read and we can sign your paperwork at that time  Please follow-up in 1 month to reassess your foot fracture.    Should you have any questions or concerns please call the internal medicine clinic at [phone].

## 2018-08-03 NOTE — Progress Notes (Signed)
CC: Completion of work paperwork  HPI:  Raven Webb is a 32 y.o.  with a PMH listed below presenting for assistance with work paperwork. She is in need of the TB test and medical evaluation.   She also had a foot fracture 1 week ago. She reports that she hit against the bathroom door twice in the same day. She reported that she has been icing it and keeping it elevated. She went to the ED to have this evaluated. X-ray there showed a nondisplaced oblique fracture through the proximal phalanx of the fifth digit. She was prescribed meloxicam at that time which she has been taking and was given a foot brace. She reports that the pain is still present and that she has difficulty walking. On exam she was wearing a brace, she had some swelling over the lateral aspect of the right foot with point tenderness over the 5th digit. Sensation and pulses were intact. We discussed that she may need a stronger pain medication for the acute pain and that this will normally take 4-6 weeks to heal.   Was offered flu shot and she did not want this at this time. She got the PPD and paperwork was pre-filled out. She will return in 2 days to have this read and paperwork completed.   Please see A&P for status of the patient's chronic medical conditions  Past Medical History:  Diagnosis Date  . GERD (gastroesophageal reflux disease)   . Hypertension   . UTI (lower urinary tract infection)    Review of Systems: Refer to history of present illness and assessment and plans for pertinent review of systems, all others reviewed and negative.  Physical Exam:  Vitals:   08/03/18 1500 08/03/18 1642  BP: (!) 153/104 (!) 151/94  Pulse: 85 69  Temp: 98.7 F (37.1 C)   TempSrc: Oral   SpO2: 100%   Weight: 225 lb 4.8 oz (102.2 kg)   Height: 5\' 7"  (1.702 m)    Physical Exam  Constitutional: She is oriented to person, place, and time and well-developed, well-nourished, and in no distress.  HENT:  Head:  Normocephalic and atraumatic.  Eyes: Pupils are equal, round, and reactive to light. Conjunctivae and EOM are normal.  Neck: Normal range of motion. Neck supple. No thyromegaly present.  Cardiovascular: Normal rate, regular rhythm and normal heart sounds. Exam reveals no gallop and no friction rub.  No murmur heard. Pulmonary/Chest: Effort normal and breath sounds normal. No respiratory distress. She has no wheezes.  Abdominal: Soft. Bowel sounds are normal. She exhibits no distension.  Musculoskeletal: Normal range of motion.  Right foot: was wearing brace, swollen area of lateral side of foot, point tenderness over 5th metatarsal area, pulses intact, sensation intact  Neurological: She is alert and oriented to person, place, and time. Gait normal.  Skin: Skin is warm and dry. No erythema.  Psychiatric: Mood and affect normal.    Social History   Socioeconomic History  . Marital status: Single    Spouse name: Not on file  . Number of children: Not on file  . Years of education: Not on file  . Highest education level: Not on file  Occupational History  . Not on file  Social Needs  . Financial resource strain: Not on file  . Food insecurity:    Worry: Not on file    Inability: Not on file  . Transportation needs:    Medical: Not on file    Non-medical: Not on  file  Tobacco Use  . Smoking status: Never Smoker  . Smokeless tobacco: Never Used  Substance and Sexual Activity  . Alcohol use: No    Alcohol/week: 0.0 standard drinks  . Drug use: No  . Sexual activity: Not on file  Lifestyle  . Physical activity:    Days per week: Not on file    Minutes per session: Not on file  . Stress: Not on file  Relationships  . Social connections:    Talks on phone: Not on file    Gets together: Not on file    Attends religious service: Not on file    Active member of club or organization: Not on file    Attends meetings of clubs or organizations: Not on file    Relationship status:  Not on file  . Intimate partner violence:    Fear of current or ex partner: Not on file    Emotionally abused: Not on file    Physically abused: Not on file    Forced sexual activity: Not on file  Other Topics Concern  . Not on file  Social History Narrative  . Not on file   Family History  Problem Relation Age of Onset  . Hypertension Mother   . Diabetes Mother   . Hypertension Father   . Hypertension Sister   . Diabetes Sister   . Hypertension Maternal Uncle   . Kidney disease Maternal Uncle     Assessment & Plan:   See Encounters Tab for problem based charting.  Patient seen with Dr. Cleda Daub

## 2018-08-04 DIAGNOSIS — S92901A Unspecified fracture of right foot, initial encounter for closed fracture: Secondary | ICD-10-CM | POA: Insufficient documentation

## 2018-08-04 NOTE — Assessment & Plan Note (Signed)
Was offered flu shot and she did not want this at this time. She got the PPD and paperwork was pre-filled out. She will return in 2 days to have this read and paperwork completed.

## 2018-08-04 NOTE — Assessment & Plan Note (Addendum)
She is on HCTZ 25 mg and amlodipine daily. BP today was elevated however patient has been having right foot pain which may be contributing. Patient was not interested in increasing medications at this time.  BP Readings from Last 3 Encounters:  08/03/18 (!) 151/94  07/31/18 (!) 161/90  07/13/18 (!) 146/90   Plan: - Continue amlodipine 10 mg - Continue HCTZ 25 mg daily

## 2018-08-04 NOTE — Assessment & Plan Note (Signed)
She also had a foot fracture 1 week ago. She reports that she hit against the bathroom door twice in the same day. She reported that she has been icing it and keeping it elevated. She went to the ED to have this evaluated. X-ray there showed a nondisplaced oblique fracture through the proximal phalanx of the fifth digit. She was prescribed meloxicam at that time which she has been taking and was given a foot brace. She reports that the pain is still present and that she has difficulty walking. On exam she was wearing a brace, she had some swelling over the lateral aspect of the right foot with point tenderness over the 5th digit. Sensation and pulses were intact. We discussed that she may need a stronger pain medication for the acute pain and that this will normally take 4-6 weeks to heal.   Plan: -Prescription for 5 days of percocet provided, advised to only take for severe pain -RTC in 1 month to repeat x-rays -Given letter about restrictions for work

## 2018-08-09 NOTE — Progress Notes (Signed)
Internal Medicine Clinic Attending  I saw and evaluated the patient.  I personally confirmed the key portions of the history and exam documented by Dr. Krienke and I reviewed pertinent patient test results.  The assessment, diagnosis, and plan were formulated together and I agree with the documentation in the resident's note.    

## 2018-08-10 NOTE — Progress Notes (Deleted)
   CC: ***  HPI:  [name] is a 32 y.o. female with HTN and previous right foot fracture  Past Medical History:  Diagnosis Date  . GERD (gastroesophageal reflux disease)   . Hypertension   . UTI (lower urinary tract infection)    Review of Systems:  ***  Physical Exam:  There were no vitals filed for this visit. ***  Assessment & Plan:   See Encounters Tab for problem based charting.  Patient {GC/GE:[id number]::"discussed with","seen with"} Dr. {NAMES:[id number]::"Butcher","Granfortuna","E. Hoffman","Klima","Mullen","Narendra","Raines","Vincent"}

## 2018-08-12 ENCOUNTER — Encounter: Payer: Medicaid Other | Admitting: Internal Medicine

## 2018-08-20 ENCOUNTER — Other Ambulatory Visit: Payer: Self-pay | Admitting: Internal Medicine

## 2018-08-20 DIAGNOSIS — I10 Essential (primary) hypertension: Secondary | ICD-10-CM

## 2018-08-20 DIAGNOSIS — K219 Gastro-esophageal reflux disease without esophagitis: Secondary | ICD-10-CM

## 2018-08-24 ENCOUNTER — Encounter: Payer: Self-pay | Admitting: Internal Medicine

## 2018-08-24 ENCOUNTER — Ambulatory Visit (HOSPITAL_COMMUNITY)
Admission: RE | Admit: 2018-08-24 | Discharge: 2018-08-24 | Disposition: A | Discharge status: Home / Self Care | Payer: Medicaid Other | Source: Ambulatory Visit | Attending: Internal Medicine | Admitting: Internal Medicine

## 2018-08-24 ENCOUNTER — Ambulatory Visit: Payer: Medicaid Other | Admitting: Internal Medicine

## 2018-08-24 VITALS — BP 149/82 | HR 85 | Temp 98.8°F | Ht 68.0 in | Wt 218.7 lb

## 2018-08-24 DIAGNOSIS — S92514D Nondisplaced fracture of proximal phalanx of right lesser toe(s), subsequent encounter for fracture with routine healing: Secondary | ICD-10-CM | POA: Diagnosis not present

## 2018-08-24 DIAGNOSIS — Z79899 Other long term (current) drug therapy: Secondary | ICD-10-CM | POA: Diagnosis not present

## 2018-08-24 DIAGNOSIS — I10 Essential (primary) hypertension: Secondary | ICD-10-CM | POA: Diagnosis not present

## 2018-08-24 DIAGNOSIS — X58XXXD Exposure to other specified factors, subsequent encounter: Secondary | ICD-10-CM

## 2018-08-24 DIAGNOSIS — S92901D Unspecified fracture of right foot, subsequent encounter for fracture with routine healing: Secondary | ICD-10-CM | POA: Insufficient documentation

## 2018-08-24 DIAGNOSIS — Z0000 Encounter for general adult medical examination without abnormal findings: Secondary | ICD-10-CM | POA: Diagnosis not present

## 2018-08-24 DIAGNOSIS — S92511A Displaced fracture of proximal phalanx of right lesser toe(s), initial encounter for closed fracture: Secondary | ICD-10-CM | POA: Diagnosis not present

## 2018-08-24 MED ORDER — CHLORTHALIDONE 25 MG PO TABS: 25.0000 mg | ORAL_TABLET | Freq: Every day | ORAL | 2 refills | Status: DC

## 2018-08-24 NOTE — Assessment & Plan Note (Addendum)
Patient had a nondisplaced oblique fracture through the proximal phalanx of the fifth digit on her right foot on 11/9. She was initially given meloxicam and a foot brace. When she was seen in clinic on 11/13 she was prescribed a 5 day prescription of percocet prn for severe pain and advised to return in 1 month to repeat xrays.   She said she was noticing improvement in her pain and swelling and did not use the percocet. However, last week she jammed her foot again and noticed the swelling worsened. She reported a "weird" feeling today and said it did not feel right when she pressed it up against her bed or tub. She said her pain has been overall well controlled and she is just using tylenol extra strength some days. She said she has not been wearing the foot brace. She was advised to continue wearing the foot brace for a few more weeks to allow for better healing. We will repeat a foot xray today.  -repeat foot xray

## 2018-08-24 NOTE — Assessment & Plan Note (Addendum)
Patient had a PPD during her last visit but could not return 2 days later to have it read because she was feeling sick. Repeat PPD today and follow up in 2 days for read.

## 2018-08-24 NOTE — Progress Notes (Signed)
   CC: Close fracture of right foot, PPD  HPI:  Ms.Analys J Hyman HopesWebb is a 32 y.o. with a PMHx listed below presenting for re-evaluation of closed fracture of her right foot and for a PPD.  For details of today's visit and the status of his chronic medical issues please refer to the assessment and plan.   Past Medical History:  Diagnosis Date  . GERD (gastroesophageal reflux disease)   . Hypertension   . UTI (lower urinary tract infection)    Review of Systems:   Review of Systems  Constitutional: Negative for chills and fever.  Cardiovascular: Negative for chest pain, palpitations and leg swelling.  Gastrointestinal: Negative for nausea and vomiting.  Musculoskeletal: Positive for joint pain.  Neurological: Negative for weakness and headaches.     Physical Exam:  Vitals:   08/24/18 1423  BP: (!) 149/82  Pulse: 85  Temp: 98.8 F (37.1 C)  TempSrc: Oral  SpO2: 100%  Weight: 218 lb 11.2 oz (99.2 kg)  Height: 5\' 8"  (1.727 m)    Physical Exam  Constitutional: She is oriented to person, place, and time and well-developed, well-nourished, and in no distress.  Cardiovascular: Normal rate, regular rhythm and normal heart sounds.  No murmur heard. Pulmonary/Chest: Effort normal. No respiratory distress. She has no wheezes.  Musculoskeletal: She exhibits tenderness. She exhibits no edema or deformity.  Right 5th digit tender to palpation, decreased active ROM   Neurological: She is alert and oriented to person, place, and time.  Skin: Skin is warm and dry. No rash noted. No erythema.  Psychiatric: Mood, memory, affect and judgment normal.    Assessment & Plan:   See Encounters Tab for problem based charting.  Patient seen with Dr. Cleda DaubE. Hoffman

## 2018-08-24 NOTE — Patient Instructions (Addendum)
[name],  It was a pleasure meeting you today! I will call you once I have the results to your x-ray.   For your hypertension, please start taking chlorthalidone 25 mg once a day. Stop taking amlodipine 10 mg and hydrochlorothiazide 25 mg. If you notice any side effects with this new medication please give us a call and let us know. We will plan to follow up in one month to see how you are tolerating this medication and how your BP is responding to it.  Happy holidays!

## 2018-08-24 NOTE — Assessment & Plan Note (Signed)
BP was 149/82 today. She stopped taking amlodipine because it was causing her heart to race. She is taking hydrochlorothiazide 25 mg daily. Discussed either decreasing amlodipine to 5 mg, as most patient's have less side effects on lower doses vs chlorthalidone 25 mg. Patient agreed to trying chlorthalidone as it would be easier to take one medication instead of two.  -discontinue amlodipine 10 mg and HCTZ 25 mg  -start chlorthalidone 25 mg qd -f/u in 4 weeks and recheck BP

## 2018-08-25 ENCOUNTER — Encounter: Payer: Self-pay | Admitting: Internal Medicine

## 2018-08-25 ENCOUNTER — Telehealth: Payer: Self-pay | Admitting: Internal Medicine

## 2018-08-25 NOTE — Telephone Encounter (Signed)
Spoke with patient regarding her foot xray results. She was informed it is mildly displaced with some healing evident on the xray. Recommended that she continue to wear the boot for at least another 2-4 weeks and to use medical tape to wrap her toe to provide support and cushion if she was to not wear the boot. Plans to follow up with her PCP in February.

## 2018-08-26 LAB — TB SKIN TEST: TB SKIN TEST: NEGATIVE

## 2018-08-26 NOTE — Progress Notes (Signed)
Internal Medicine Clinic Attending  I saw and evaluated the patient.  I personally confirmed the key portions of the history and exam documented by Dr.  Rehman  and I reviewed pertinent patient test results.  The assessment, diagnosis, and plan were formulated together and I agree with the documentation in the resident's note.  

## 2018-09-09 ENCOUNTER — Encounter: Payer: Medicaid Other | Admitting: Internal Medicine

## 2018-09-27 ENCOUNTER — Encounter (HOSPITAL_COMMUNITY): Payer: Self-pay | Admitting: *Deleted

## 2018-09-27 ENCOUNTER — Emergency Department (HOSPITAL_COMMUNITY): Payer: Medicaid Other

## 2018-09-27 ENCOUNTER — Emergency Department (HOSPITAL_COMMUNITY)
Admission: EM | Admit: 2018-09-27 | Discharge: 2018-09-27 | Disposition: A | Discharge status: Home / Self Care | Payer: Medicaid Other | Attending: Emergency Medicine | Admitting: Emergency Medicine

## 2018-09-27 ENCOUNTER — Ambulatory Visit: Payer: Medicaid Other

## 2018-09-27 DIAGNOSIS — R1030 Lower abdominal pain, unspecified: Secondary | ICD-10-CM | POA: Diagnosis not present

## 2018-09-27 DIAGNOSIS — I10 Essential (primary) hypertension: Secondary | ICD-10-CM | POA: Diagnosis not present

## 2018-09-27 DIAGNOSIS — R109 Unspecified abdominal pain: Secondary | ICD-10-CM | POA: Diagnosis not present

## 2018-09-27 LAB — COMPREHENSIVE METABOLIC PANEL
ALT: 11 U/L (ref 0–44)
AST: 15 U/L (ref 15–41)
Albumin: 3.8 g/dL (ref 3.5–5.0)
Alkaline Phosphatase: 111 U/L (ref 38–126)
Anion gap: 9 (ref 5–15)
BUN: 7 mg/dL (ref 6–20)
CHLORIDE: 107 mmol/L (ref 98–111)
CO2: 23 mmol/L (ref 22–32)
CREATININE: 1.03 mg/dL — AB (ref 0.44–1.00)
Calcium: 9.3 mg/dL (ref 8.9–10.3)
Glucose, Bld: 98 mg/dL (ref 70–99)
POTASSIUM: 3.8 mmol/L (ref 3.5–5.1)
Sodium: 139 mmol/L (ref 135–145)
Total Bilirubin: 0.5 mg/dL (ref 0.3–1.2)
Total Protein: 7.8 g/dL (ref 6.5–8.1)

## 2018-09-27 LAB — URINALYSIS, ROUTINE W REFLEX MICROSCOPIC
BILIRUBIN URINE: NEGATIVE
Glucose, UA: NEGATIVE mg/dL
HGB URINE DIPSTICK: NEGATIVE
Ketones, ur: NEGATIVE mg/dL
NITRITE: NEGATIVE
PROTEIN: NEGATIVE mg/dL
Specific Gravity, Urine: 1.021 (ref 1.005–1.030)
pH: 6 (ref 5.0–8.0)

## 2018-09-27 LAB — CBC
HEMATOCRIT: 41 % (ref 36.0–46.0)
Hemoglobin: 13.3 g/dL (ref 12.0–15.0)
MCH: 27.9 pg (ref 26.0–34.0)
MCHC: 32.4 g/dL (ref 30.0–36.0)
MCV: 86.1 fL (ref 80.0–100.0)
NRBC: 0 % (ref 0.0–0.2)
Platelets: 449 10*3/uL — ABNORMAL HIGH (ref 150–400)
RBC: 4.76 MIL/uL (ref 3.87–5.11)
RDW: 13.5 % (ref 11.5–15.5)
WBC: 5.5 10*3/uL (ref 4.0–10.5)

## 2018-09-27 LAB — I-STAT BETA HCG BLOOD, ED (MC, WL, AP ONLY)

## 2018-09-27 LAB — I-STAT TROPONIN, ED: Troponin i, poc: 0.01 ng/mL (ref 0.00–0.08)

## 2018-09-27 LAB — LIPASE, BLOOD: Lipase: 23 U/L (ref 11–51)

## 2018-09-27 MED ORDER — DICYCLOMINE HCL 20 MG PO TABS: 20.0000 mg | ORAL_TABLET | Freq: Two times a day (BID) | ORAL | 0 refills | Status: DC

## 2018-09-27 MED ORDER — KETOROLAC TROMETHAMINE 15 MG/ML IJ SOLN
15.0000 mg | Freq: Once | INTRAMUSCULAR | Status: AC
  Administered 2018-09-27: 15 mg via INTRAVENOUS
  Filled 2018-09-27: qty 1

## 2018-09-27 MED ORDER — IOHEXOL 300 MG/ML  SOLN
100.0000 mL | Freq: Once | INTRAMUSCULAR | Status: AC | PRN
  Administered 2018-09-27: 100 mL via INTRAVENOUS

## 2018-09-27 MED ORDER — SODIUM CHLORIDE 0.9 % IV BOLUS
1000.0000 mL | Freq: Once | INTRAVENOUS | Status: AC
  Administered 2018-09-27: 1000 mL via INTRAVENOUS

## 2018-09-27 NOTE — Discharge Instructions (Addendum)
You were evaluated in the Emergency Department and after careful evaluation, we did not find any emergent condition requiring admission or further testing in the hospital. ° °Please return to the Emergency Department if you experience any worsening of your condition.  We encourage you to follow up with a primary care provider.  Thank you for allowing us to be a part of your care. °

## 2018-09-27 NOTE — ED Provider Notes (Signed)
[facility] Department Provider Note MRN:  [id number]  Arrival date & time: 09/27/18     Chief Complaint   Abdominal Pain and Back Pain   History of Present Illness   Raven Webb is a 33 y.o. year-old female with a history of hypertension, GERD presenting to the ED with chief complaint of abdominal pain and low back pain.  Pain present for 1 to 2 weeks, constant, progressively worsening.  Pain is located in the left flank, suprapubic region, worst in the right lower quadrant.  More recently associated with dizziness, central sharp chest pain when lying flat, denies fever, mild nausea but no vomiting.  No upper abdominal pain, no vaginal bleeding or discharge, not sexually active, no history of STDs.  Denies hematuria or dysuria.  Review of Systems  A complete 10 system review of systems was obtained and all systems are negative except as noted in the HPI and PMH.   Patient's Health History    Past Medical History:  Diagnosis Date  . GERD (gastroesophageal reflux disease)   . Hypertension   . UTI (lower urinary tract infection)     Past Surgical History:  Procedure Laterality Date  . WISDOM TOOTH EXTRACTION      Family History  Problem Relation Age of Onset  . Hypertension Mother   . Diabetes Mother   . Hypertension Father   . Hypertension Sister   . Diabetes Sister   . Hypertension Maternal Uncle   . Kidney disease Maternal Uncle     Social History   Socioeconomic History  . Marital status: Single    Spouse name: Not on file  . Number of children: Not on file  . Years of education: Not on file  . Highest education level: Not on file  Occupational History  . Not on file  Social Needs  . Financial resource strain: Not on file  . Food insecurity:    Worry: Not on file    Inability: Not on file  . Transportation needs:    Medical: Not on file    Non-medical: Not on file  Tobacco Use  . Smoking status: Never Smoker  . Smokeless  tobacco: Never Used  Substance and Sexual Activity  . Alcohol use: No    Alcohol/week: 0.0 standard drinks  . Drug use: No  . Sexual activity: Not on file  Lifestyle  . Physical activity:    Days per week: Not on file    Minutes per session: Not on file  . Stress: Not on file  Relationships  . Social connections:    Talks on phone: Not on file    Gets together: Not on file    Attends religious service: Not on file    Active member of club or organization: Not on file    Attends meetings of clubs or organizations: Not on file    Relationship status: Not on file  . Intimate partner violence:    Fear of current or ex partner: Not on file    Emotionally abused: Not on file    Physically abused: Not on file    Forced sexual activity: Not on file  Other Topics Concern  . Not on file  Social History Narrative  . Not on file     Physical Exam  Vital Signs and Nursing Notes reviewed Vitals:   09/27/18 1445 09/27/18 1500  BP: 137/86 134/84  Pulse: 64 65  Resp:    Temp:    SpO2:  100% 100%    CONSTITUTIONAL: Well-appearing, NAD NEURO:  Alert and oriented x 3, no focal deficits EYES:  eyes equal and reactive ENT/NECK:  no LAD, no JVD CARDIO: Regular rate, well-perfused, normal S1 and S2 PULM:  CTAB no wheezing or rhonchi GI/GU:  normal bowel sounds, non-distended, minimal lower abdominal tenderness MSK/SPINE:  No gross deformities, no edema SKIN:  no rash, atraumatic PSYCH:  Appropriate speech and behavior  Diagnostic and Interventional Summary    EKG Interpretation  Date/Time:  Monday September 27 2018 12:14:21 EST Ventricular Rate:  70 PR Interval:  164 QRS Duration: 90 QT Interval:  426 QTC Calculation: 460 R Axis:   39 Text Interpretation:  Normal sinus rhythm Normal ECG Confirmed by Kennis CarinaBero, Jatavia Keltner [phone]([id number]) on 09/27/2018 12:57:37 PM      Labs Reviewed  COMPREHENSIVE METABOLIC PANEL - Abnormal; Notable for the following components:      Result Value   Creatinine,  Ser 1.03 (*)    All other components within normal limits  CBC - Abnormal; Notable for the following components:   Platelets 449 (*)    All other components within normal limits  URINALYSIS, ROUTINE W REFLEX MICROSCOPIC - Abnormal; Notable for the following components:   APPearance HAZY (*)    Leukocytes, UA SMALL (*)    Bacteria, UA FEW (*)    All other components within normal limits  LIPASE, BLOOD  I-STAT BETA HCG BLOOD, ED ([facility], [facility], [facility] ONLY)  I-STAT TROPONIN, ED    CT ABDOMEN PELVIS W CONTRAST  Final Result      Medications  ketorolac (TORADOL) 15 MG/ML injection 15 mg (15 mg Intravenous Given 09/27/18 1228)  sodium chloride 0.9 % bolus 1,000 mL (0 mLs Intravenous Stopped 09/27/18 1329)  iohexol (OMNIPAQUE) 300 MG/ML solution 100 mL (100 mLs Intravenous Contrast Given 09/27/18 1557)     Procedures Critical Care  ED Course and Medical Decision Making  I have reviewed the triage vital signs and the nursing notes.  Pertinent labs & imaging results that were available during my care of the patient were reviewed by me and considered in my medical decision making (see below for details).  Considering UTI, pyelonephritis, ovarian cyst, appendicitis in this 33 year old female with lower abdominal pain/flank pain.  Diverticulitis also possible, less concern for ovarian torsion or PID given lack of significant pain or risk factors.  Labs pending, will consider CT pending results of labs and urinalysis and reassessment.  Clinical Course as of Sep 27 1653  Mon Sep 27, 2018  1301 Urinalysis without clear evidence of infection, patient continues to have pain in the right lower quadrant, will CT to exclude appendicitis or diverticulitis.   [MB]    Clinical Course User Index [MB] Sabas SousBero, Tadeo Besecker M, MD    CT unremarkable.  Labs and urinalysis reassuring.  Patient feels well.  Mentions that she has been having diarrhea and that pain has been worse with meals lately.  Favoring GI viral process  versus IBS.  After the discussed management above, the patient was determined to be safe for discharge.  The patient was in agreement with this plan and all questions regarding their care were answered.  ED return precautions were discussed and the patient will return to the ED with any significant worsening of condition.  Elmer SowMichael M. Pilar PlateBero, MD [facility] [email or web address]  Final Clinical Impressions(s) / ED Diagnoses     ICD-10-CM   1. Lower abdominal pain R10.30  ED Discharge Orders         Ordered    dicyclomine (BENTYL) 20 MG tablet  2 times daily     09/27/18 1653             Sabas SousBero, Ivy Meriwether M, MD 09/27/18 1655

## 2018-09-27 NOTE — ED Triage Notes (Signed)
Pt in c/o abdominal pain and back pain for the last few weeks, pain is suprapubic and to her lower back, no distress noted

## 2018-09-28 ENCOUNTER — Ambulatory Visit (INDEPENDENT_AMBULATORY_CARE_PROVIDER_SITE_OTHER): Payer: Medicaid Other

## 2018-09-28 ENCOUNTER — Telehealth: Payer: Self-pay | Admitting: Family Medicine

## 2018-09-28 ENCOUNTER — Ambulatory Visit: Payer: Medicaid Other

## 2018-09-28 VITALS — BP 148/88 | HR 79 | Wt 218.0 lb

## 2018-09-28 DIAGNOSIS — Z3042 Encounter for surveillance of injectable contraceptive: Secondary | ICD-10-CM

## 2018-09-28 MED ORDER — MEDROXYPROGESTERONE ACETATE 150 MG/ML IM SUSP
150.0000 mg | Freq: Once | INTRAMUSCULAR | Status: AC
  Administered 2018-09-28: 150 mg via INTRAMUSCULAR

## 2018-09-28 NOTE — Progress Notes (Signed)
Rich FuchsMiyoshi J Huckaba here for Depo-Provera  Injection.  Injection administered without complication. Patient will return in 3 months for next injection.  Ralene BatheJeanetta Joanne Salah, RN 09/28/2018  2:28 PM

## 2018-09-28 NOTE — Telephone Encounter (Signed)
Patient ci stated that her son has an appt and she would like her appt by 1:30pm. Offered a 2pm patient agreed and stated she would attend the appt.

## 2018-09-28 NOTE — Progress Notes (Signed)
Chart reviewed for nurse visit. Agree with plan of care.   [name], CNM 09/28/2018 3:23 PM   

## 2018-11-02 ENCOUNTER — Other Ambulatory Visit: Payer: Self-pay

## 2018-11-02 ENCOUNTER — Ambulatory Visit (HOSPITAL_COMMUNITY)
Admission: EM | Admit: 2018-11-02 | Discharge: 2018-11-02 | Disposition: A | Discharge status: Home / Self Care | Payer: Medicaid Other | Attending: Family Medicine | Admitting: Family Medicine

## 2018-11-02 ENCOUNTER — Encounter (HOSPITAL_COMMUNITY): Payer: Self-pay | Admitting: Emergency Medicine

## 2018-11-02 DIAGNOSIS — M940 Chondrocostal junction syndrome [Tietze]: Secondary | ICD-10-CM

## 2018-11-02 DIAGNOSIS — R6889 Other general symptoms and signs: Secondary | ICD-10-CM

## 2018-11-02 HISTORY — DX: Irritable bowel syndrome, unspecified: K58.9

## 2018-11-02 MED ORDER — ACETAMINOPHEN 325 MG PO TABS
650.0000 mg | ORAL_TABLET | Freq: Once | ORAL | Status: AC
  Administered 2018-11-02: 650 mg via ORAL

## 2018-11-02 MED ORDER — OSELTAMIVIR PHOSPHATE 75 MG PO CAPS: 75.0000 mg | ORAL_CAPSULE | Freq: Two times a day (BID) | ORAL | 0 refills | Status: DC

## 2018-11-02 MED ORDER — FLUTICASONE PROPIONATE 50 MCG/ACT NA SUSP: 2.0000 | Freq: Every day | NASAL | 0 refills | Status: DC

## 2018-11-02 MED ORDER — BENZONATATE 100 MG PO CAPS: 100.0000 mg | ORAL_CAPSULE | Freq: Three times a day (TID) | ORAL | 0 refills | Status: DC

## 2018-11-02 MED ORDER — CETIRIZINE HCL 10 MG PO CHEW: 10.0000 mg | CHEWABLE_TABLET | Freq: Every day | ORAL | 0 refills | Status: DC

## 2018-11-02 MED ORDER — ACETAMINOPHEN 325 MG PO TABS
ORAL_TABLET | ORAL | Status: AC
  Filled 2018-11-02: qty 2

## 2018-11-02 NOTE — Discharge Instructions (Signed)
Get plenty of rest and push fluids.  Drink at least half your body weight in ounces.  You may supplement with OTC Pedialyte or oral rehydration solution Tamiflu prescribed.  Take as directed and to completion Tessalon Perles prescribed for cough Zyrtec and flonase as needed for congestion, runny nose, and/or sore throat Use OTC tylenol and/or ibuprofen every 4 hours for fever Follow up with PCP this week or next week for recheck Go to the ED if you have any new or worsening symptoms fever that does not moderate with tylenol, chills, nausea, vomiting, chest pain, worsening cough, shortness of breath, wheezing, abdominal pain, changes in bowel or bladder habits, etc..Marland Kitchen

## 2018-11-02 NOTE — ED Provider Notes (Signed)
[facility]   [id number] 11/02/18 Arrival Time: 1057   CC: URI symptoms   SUBJECTIVE: History from: patient.  Raven Webb is a 33 y.o. female who presents with abrupt onset of cough, sore throat, fever with tmax of 100 at home, body aches and fatigue x <48 hours.  Admits to sick exposure to son with similar symptoms.  Has tried OTC medications without relief.  Denies previous symptoms in the past.   Denies sinus pain, rhinorrhea, SOB, wheezing, chest pain, nausea, changes in bowel or bladder habits.    Received flu shot this year: no.  ROS: As per HPI.  Past Medical History:  Diagnosis Date  . GERD (gastroesophageal reflux disease)   . Hypertension   . IBS (irritable bowel syndrome)   . UTI (lower urinary tract infection)    Past Surgical History:  Procedure Laterality Date  . WISDOM TOOTH EXTRACTION     Allergies  Allergen Reactions  . Acyclovir And Related Other (See Comments)    Unknown reaction - pt does not know why this was put on her chart   Current Facility-Administered Medications on File Prior to Encounter  Medication Dose Route Frequency Provider Last Rate Last Dose  . medroxyPROGESTERone (DEPO-PROVERA) injection 150 mg  150 mg Intramuscular Q90 days Anyanwu, Ugonna A, MD   150 mg at 07/13/18 1455   Current Outpatient Medications on File Prior to Encounter  Medication Sig Dispense Refill  . acetaminophen (TYLENOL) 325 MG tablet Take 650 mg by mouth every 6 (six) hours as needed for headache (pain).    . chlorthalidone (HYGROTON) 25 MG tablet Take 1 tablet (25 mg total) by mouth daily. 30 tablet 2  . dicyclomine (BENTYL) 20 MG tablet Take 1 tablet (20 mg total) by mouth 2 (two) times daily. 20 tablet 0  . omeprazole (PRILOSEC) 40 MG capsule TAKE ONE CAPSULE BY MOUTH ONCE DAILY (Patient taking differently: Take 40 mg by mouth daily as needed (acid reflux/ heartburn). ) 90 capsule 4   Social History   Socioeconomic History  . Marital status: Single     Spouse name: Not on file  . Number of children: Not on file  . Years of education: Not on file  . Highest education level: Not on file  Occupational History  . Not on file  Social Needs  . Financial resource strain: Not on file  . Food insecurity:    Worry: Not on file    Inability: Not on file  . Transportation needs:    Medical: Not on file    Non-medical: Not on file  Tobacco Use  . Smoking status: Never Smoker  . Smokeless tobacco: Never Used  Substance and Sexual Activity  . Alcohol use: No    Alcohol/week: 0.0 standard drinks  . Drug use: No  . Sexual activity: Not on file  Lifestyle  . Physical activity:    Days per week: Not on file    Minutes per session: Not on file  . Stress: Not on file  Relationships  . Social connections:    Talks on phone: Not on file    Gets together: Not on file    Attends religious service: Not on file    Active member of club or organization: Not on file    Attends meetings of clubs or organizations: Not on file    Relationship status: Not on file  . Intimate partner violence:    Fear of current or ex partner: Not on file  Emotionally abused: Not on file    Physically abused: Not on file    Forced sexual activity: Not on file  Other Topics Concern  . Not on file  Social History Narrative  . Not on file   Family History  Problem Relation Age of Onset  . Hypertension Mother   . Diabetes Mother   . Hypertension Father   . Hypertension Sister   . Diabetes Sister   . Hypertension Maternal Uncle   . Kidney disease Maternal Uncle     OBJECTIVE:  Vitals:   11/02/18 1213 11/02/18 1215 11/02/18 1312  BP: (!) 156/106 (!) 153/89 140/73  Pulse: 99  98  Resp: 18  18  Temp: (!) 100.8 F (38.2 C)  99.2 F (37.3 C)  TempSrc: Temporal  Oral  SpO2: 100%  98%     General appearance: alert; appears fatigued, but nontoxic; speaking in full sentences and tolerating own secretions HEENT: NCAT; Ears: EACs clear, TMs pearly gray;  Eyes: PERRL.  EOM grossly intact. Nose: nares patent with mild rhinorrhea, turbinates swollen, Throat: oropharynx clear, tonsils non erythematous or enlarged, uvula midline  Neck: supple without LAD Lungs: unlabored respirations, symmetrical air entry; cough: mild; no respiratory distress; CTAB Heart: regular rate and rhythm.  Radial pulses 2+ symmetrical bilaterally Chest wall: TTP with AP compression Skin: warm and dry Psychological: alert and cooperative; normal mood and affect  ASSESSMENT & PLAN:  1. Flu-like symptoms   2. Costochondritis     Meds ordered this encounter  Medications  . acetaminophen (TYLENOL) tablet 650 mg  . oseltamivir (TAMIFLU) 75 MG capsule    Sig: Take 1 capsule (75 mg total) by mouth every 12 (twelve) hours.    Dispense:  10 capsule    Refill:  0    Order Specific Question:   Supervising Provider    Answer:   Eustace MooreNELSON, YVONNE SUE [[id number]]  . cetirizine (ZYRTEC) 10 MG chewable tablet    Sig: Chew 1 tablet (10 mg total) by mouth daily.    Dispense:  20 tablet    Refill:  0    Order Specific Question:   Supervising Provider    Answer:   Eustace MooreNELSON, YVONNE SUE [[id number]]  . fluticasone (FLONASE) 50 MCG/ACT nasal spray    Sig: Place 2 sprays into both nostrils daily.    Dispense:  16 g    Refill:  0    Order Specific Question:   Supervising Provider    Answer:   Eustace MooreNELSON, YVONNE SUE [[id number]]  . benzonatate (TESSALON) 100 MG capsule    Sig: Take 1 capsule (100 mg total) by mouth every 8 (eight) hours.    Dispense:  21 capsule    Refill:  0    Order Specific Question:   Supervising Provider    Answer:   Eustace MooreELSON, YVONNE SUE [[id number]]    Get plenty of rest and push fluids.  Drink at least half your body weight in ounces.  You may supplement with OTC Pedialyte or oral rehydration solution Tamiflu prescribed.  Take as directed and to completion Tessalon Perles prescribed for cough Zyrtec and flonase as needed for congestion, runny nose, and/or sore throat Use  OTC tylenol and/or ibuprofen every 4 hours for fever Follow up with PCP this week or next week for recheck Go to the ED if you have any new or worsening symptoms fever that does not moderate with tylenol, chills, nausea, vomiting, chest pain, worsening cough, shortness of breath, wheezing, abdominal pain, changes in bowel  or bladder habits, etc...   Reviewed expectations re: course of current medical issues. Questions answered. Outlined signs and symptoms indicating need for more acute intervention. Patient verbalized understanding. After Visit Summary given.         Rennis Harding, PA-C 11/02/18 1404

## 2018-11-02 NOTE — ED Triage Notes (Signed)
On Saturday, patient started feeling bad.  Patient complains of chest soreness, headache and sore throat.  Patient has a very congested cough and reports pain with coughing.

## 2018-11-18 DIAGNOSIS — H16223 Keratoconjunctivitis sicca, not specified as Sjogren's, bilateral: Secondary | ICD-10-CM | POA: Diagnosis not present

## 2018-11-18 DIAGNOSIS — H40033 Anatomical narrow angle, bilateral: Secondary | ICD-10-CM | POA: Diagnosis not present

## 2018-11-20 DIAGNOSIS — H43393 Other vitreous opacities, bilateral: Secondary | ICD-10-CM | POA: Diagnosis not present

## 2018-11-29 ENCOUNTER — Encounter: Payer: Self-pay | Admitting: Internal Medicine

## 2018-11-29 ENCOUNTER — Ambulatory Visit: Payer: Medicaid Other | Admitting: Internal Medicine

## 2018-11-29 ENCOUNTER — Other Ambulatory Visit: Payer: Self-pay

## 2018-11-29 ENCOUNTER — Ambulatory Visit (HOSPITAL_COMMUNITY)
Admission: RE | Admit: 2018-11-29 | Discharge: 2018-11-29 | Disposition: A | Discharge status: Home / Self Care | Payer: Medicaid Other | Source: Ambulatory Visit | Attending: Internal Medicine | Admitting: Internal Medicine

## 2018-11-29 VITALS — BP 148/102 | HR 75 | Temp 98.9°F | Ht 67.0 in | Wt 219.2 lb

## 2018-11-29 DIAGNOSIS — R059 Cough, unspecified: Secondary | ICD-10-CM

## 2018-11-29 DIAGNOSIS — R05 Cough: Secondary | ICD-10-CM | POA: Insufficient documentation

## 2018-11-29 DIAGNOSIS — J209 Acute bronchitis, unspecified: Secondary | ICD-10-CM

## 2018-11-29 MED ORDER — GUAIFENESIN-DM 100-10 MG/5ML PO SYRP: 5.0000 mL | ORAL_SOLUTION | ORAL | 0 refills | Status: DC | PRN

## 2018-11-29 MED ORDER — FLUTICASONE PROPIONATE 50 MCG/ACT NA SUSP: 1.0000 | Freq: Every day | NASAL | 0 refills | Status: DC

## 2018-11-29 MED ORDER — BENZONATATE 100 MG PO CAPS: 100.0000 mg | ORAL_CAPSULE | Freq: Three times a day (TID) | ORAL | 1 refills | Status: DC | PRN

## 2018-11-29 NOTE — Assessment & Plan Note (Addendum)
Acute bronchitis: Raven Webb was evaluated in the emergency department on November 02, 2018 and was diagnosed with the flu.  Since then, she has had persistent cough both at day and at night with intermittent hemoptysis.  This has not improved with Mucinex.  She also does report of pleuritic chest pain.  In regards to her cough she states that productivity ranges from clear to yellow to green.  She denies fevers, chills, postnasal drip, recent travel history, calf pain, history of asthma or shortness of breath.  On physical exams, she has boggy nasal turbinates with streaks of blood otherwise unremarkable sickle exam is without wheezing, crackles, rhonchi, sinus tenderness.  Pulmonary embolism, TB and pneumonia are all less likely in this patient.  Plan: -Trial of Tessalon Perles -Trial of Robitussin-DM -Flonase -Chest x-ray today

## 2018-11-29 NOTE — Patient Instructions (Addendum)
Raven Webb,  It was a pleasure taking care of you at the clinic today and I am sorry to hear that you are still having the cough.  From my physical exams and also speaking to you, it looks like for you might have his acute bronchitis which is an inflammation of your upper respiratory tract.  I was able to see streaks of blood in your nose and inflammation, which might also cause bleeding you were noticing in your cough.  Your my recommendations after today's visit  1.  I am writing your prescription for Tessalon cough pills 2.  I have given you a prescription for Robitussin DM.  This medication might make you drowsy so please avoid if you are going to drive. 3.  I am writing a prescription for Flonase for which you spray in each nostril 4.  I have ordered a chest x-ray for you though my suspicion for you have been pneumonia is very low.  Please take care of yourself and avoid any sick contacts.  Dr. Dortha Schwalbe  Please call the internal medicine center clinic if you have any questions or concerns, we may be able to help and keep you from a long and expensive emergency room wait. Our clinic and after hours phone number is [phone], the best time to call is Monday through Friday 9 am to 4 pm but there is always someone available 24/7 if you have an emergency. If you need medication refills please notify your pharmacy one week in advance and they will send [place] a request.

## 2018-11-29 NOTE — Progress Notes (Signed)
   CC: Cough, hemoptysis  HPI:  [name] is a 33 y.o. woman with medical history listed below presenting with cough and hemoptysis.  Please see problem based charting for further details.  Past Medical History:  Diagnosis Date  . GERD (gastroesophageal reflux disease)   . Hypertension   . IBS (irritable bowel syndrome)   . UTI (lower urinary tract infection)    Review of Systems: As per HPI  Physical Exam:  Vitals:   11/29/18 0947  BP: (!) 148/102  Pulse: 75  Temp: 98.9 F (37.2 C)  TempSrc: Oral  SpO2: 100%  Weight: 219 lb 3.2 oz (99.4 kg)  Height: 5\' 7"  (1.702 m)   Physical Exam Vitals signs and nursing note reviewed.  Constitutional:      Appearance: Normal appearance.  HENT:     Head: Normocephalic and atraumatic.     Nose:     Comments: Bilateral boggy nasal turbinates with streaks of blood.    Mouth/Throat:     Mouth: Mucous membranes are moist.     Pharynx: No oropharyngeal exudate or posterior oropharyngeal erythema.  Eyes:     Conjunctiva/sclera: Conjunctivae normal.  Neck:     Musculoskeletal: Neck supple.  Cardiovascular:     Rate and Rhythm: Normal rate and regular rhythm.     Heart sounds: Normal heart sounds. No murmur. No gallop.   Pulmonary:     Effort: Pulmonary effort is normal.     Breath sounds: Normal breath sounds. No wheezing, rhonchi or rales.  Neurological:     Mental Status: She is alert.     Assessment & Plan:   See Encounters Tab for problem based charting.  Patient discussed with Dr. Sandre Kitty

## 2018-11-30 ENCOUNTER — Telehealth: Payer: Self-pay | Admitting: Internal Medicine

## 2018-11-30 NOTE — Telephone Encounter (Signed)
Called Ms. Dimos to discuss results of chest x-ray.  Overall imaging was unremarkable and she was glad to hear about results.

## 2018-12-01 NOTE — Addendum Note (Signed)
Addended by: Anne Shutter on: 12/01/2018 04:52 PM   Modules accepted: Level of Service

## 2018-12-01 NOTE — Progress Notes (Signed)
Internal Medicine Clinic Attending  Case discussed with Dr. Dortha Schwalbe at the time of the visit.  We reviewed the resident's history and exam and pertinent patient test results.  I agree with the assessment, diagnosis, and plan of care documented in the resident's note.  Likely lingering cough after viral URI. Will treat symptomatically and ensure improvement. CXR is normal, which is reassuring.   Jessy Oto, M.D., Ph.D.

## 2018-12-14 ENCOUNTER — Other Ambulatory Visit: Payer: Self-pay

## 2018-12-14 ENCOUNTER — Ambulatory Visit (INDEPENDENT_AMBULATORY_CARE_PROVIDER_SITE_OTHER): Payer: Medicaid Other

## 2018-12-14 DIAGNOSIS — Z3042 Encounter for surveillance of injectable contraceptive: Secondary | ICD-10-CM | POA: Diagnosis not present

## 2018-12-14 MED ORDER — MEDROXYPROGESTERONE ACETATE 150 MG/ML IM SUSP
150.0000 mg | Freq: Once | INTRAMUSCULAR | Status: AC
  Administered 2018-12-14: 150 mg via INTRAMUSCULAR

## 2018-12-14 NOTE — Progress Notes (Signed)
Rich Fuchs here for Depo-Provera  Injection.  Injection administered without complication. Patient will return in 3 months for next injection.  Ralene Bathe, RN 12/14/2018  1:56 PM

## 2018-12-14 NOTE — Progress Notes (Signed)
Chart reviewed for nurse visit. Agree with plan of care.   Marylene Land, CNM 12/14/2018 5:06 PM

## 2018-12-15 ENCOUNTER — Ambulatory Visit: Payer: Medicaid Other

## 2018-12-24 ENCOUNTER — Other Ambulatory Visit: Payer: Self-pay | Admitting: *Deleted

## 2018-12-24 ENCOUNTER — Encounter: Payer: Self-pay | Admitting: Internal Medicine

## 2018-12-24 MED ORDER — CETIRIZINE HCL 10 MG PO CHEW: 10.0000 mg | CHEWABLE_TABLET | Freq: Every day | ORAL | 4 refills | Status: DC

## 2019-01-04 ENCOUNTER — Telehealth: Payer: Self-pay | Admitting: *Deleted

## 2019-01-04 NOTE — Telephone Encounter (Signed)
Call to [employer] for PA for Cetirizine HCL 10 mg Chewable tablets.  Cetirizine Chewable tablets are not on formulary.  Cetirizine 10 mg tablets are covered .  Call to patient to see if swallowing a tablet is a problem.  Patient stated has no problems swallowing tablets.  Message to be routed to Dr. Petra Kuba to change Cetrizine to regular tablets.  Angelina Ok, RN 01/04/2019 11:21 AM

## 2019-01-06 ENCOUNTER — Other Ambulatory Visit: Payer: Self-pay | Admitting: Internal Medicine

## 2019-01-06 DIAGNOSIS — R05 Cough: Secondary | ICD-10-CM

## 2019-01-06 DIAGNOSIS — R059 Cough, unspecified: Secondary | ICD-10-CM

## 2019-01-06 MED ORDER — CETIRIZINE HCL 10 MG PO TABS: 10.0000 mg | ORAL_TABLET | Freq: Every day | ORAL | 3 refills | Status: DC

## 2019-01-06 NOTE — Telephone Encounter (Signed)
Noted. New Rx has been sent. Thanks!

## 2019-01-11 ENCOUNTER — Other Ambulatory Visit: Payer: Self-pay | Admitting: Internal Medicine

## 2019-02-07 ENCOUNTER — Other Ambulatory Visit: Payer: Self-pay | Admitting: Internal Medicine

## 2019-03-01 ENCOUNTER — Ambulatory Visit (INDEPENDENT_AMBULATORY_CARE_PROVIDER_SITE_OTHER): Payer: Medicaid Other | Admitting: *Deleted

## 2019-03-01 ENCOUNTER — Encounter: Payer: Self-pay | Admitting: Internal Medicine

## 2019-03-01 ENCOUNTER — Other Ambulatory Visit: Payer: Self-pay

## 2019-03-01 VITALS — BP 146/105 | HR 72 | Temp 98.6°F | Ht 67.0 in | Wt 216.8 lb

## 2019-03-01 DIAGNOSIS — Z3042 Encounter for surveillance of injectable contraceptive: Secondary | ICD-10-CM

## 2019-03-01 MED ORDER — MEDROXYPROGESTERONE ACETATE 150 MG/ML IM SUSP
150.0000 mg | Freq: Once | INTRAMUSCULAR | Status: AC
  Administered 2019-03-01: 150 mg via INTRAMUSCULAR

## 2019-03-01 NOTE — Addendum Note (Signed)
Addended by: Dolores Hoose on: 03/01/2019 10:21 AM   Modules accepted: Orders

## 2019-03-01 NOTE — Progress Notes (Signed)
Chart reviewed for nurse visit. Agree with plan of care.   Neaveh Belanger M, CNM 03/01/2019 10:26 AM   

## 2019-03-01 NOTE — Progress Notes (Signed)
Gloriajean Dell here for Depo-Provera  Injection.  Injection administered without complication. Patient will return in 3 months for next injection.  Advised pt that she would be due for her annual gyn exam at her next appointment. Pt verbalized understanding.  Pt has elevated BP during visit. Recommended that pt follow up with her PCP regarding this.  Pt verbalized understanding.   Dolores Hoose, RN 03/01/2019  10:01 AM

## 2019-03-03 ENCOUNTER — Ambulatory Visit: Payer: Medicaid Other

## 2019-03-08 ENCOUNTER — Telehealth: Payer: Self-pay

## 2019-03-08 ENCOUNTER — Other Ambulatory Visit: Payer: Self-pay | Admitting: Internal Medicine

## 2019-03-08 NOTE — Telephone Encounter (Signed)
Requesting to speak with a nurse about bp. Please call back.  

## 2019-03-08 NOTE — Telephone Encounter (Signed)
Call in a new encounter

## 2019-03-08 NOTE — Telephone Encounter (Signed)
Agree with appointment.  Will not make medication changes until we see her tomorrow.  Thank you!

## 2019-03-08 NOTE — Telephone Encounter (Signed)
Pt calls and states she feels like her BP is elevated, states she has a h/a and feels tired. Ask if she is taking her med as prescribed, states she cant take it cause it makes her feel bad. Went thru notes, ask her what she is taking, she states I think I took some norvasc. Discussed that the norvasc was to be stopped and chlorthalidone was the med for her BP, she stated when she takes all those medicines she feels more tired. Ask what she takes, she didn't know but she stated several for her BP. She was informed she should only take chlorthalidone, zyrtec, omeprazole. She stated oh, well all that makes me feel bad. appt made for 6/17 at [address] [facility], ask her to bring all her meds with her, she was agreeable

## 2019-03-09 ENCOUNTER — Other Ambulatory Visit: Payer: Self-pay

## 2019-03-09 ENCOUNTER — Ambulatory Visit: Payer: Medicaid Other | Admitting: Internal Medicine

## 2019-03-09 VITALS — BP 138/97 | HR 83 | Temp 99.3°F | Ht 67.0 in | Wt 218.0 lb

## 2019-03-09 DIAGNOSIS — Z793 Long term (current) use of hormonal contraceptives: Secondary | ICD-10-CM

## 2019-03-09 DIAGNOSIS — H9209 Otalgia, unspecified ear: Secondary | ICD-10-CM | POA: Insufficient documentation

## 2019-03-09 DIAGNOSIS — Z79899 Other long term (current) drug therapy: Secondary | ICD-10-CM | POA: Diagnosis not present

## 2019-03-09 DIAGNOSIS — H9202 Otalgia, left ear: Secondary | ICD-10-CM

## 2019-03-09 DIAGNOSIS — I10 Essential (primary) hypertension: Secondary | ICD-10-CM | POA: Diagnosis not present

## 2019-03-09 MED ORDER — LISINOPRIL 20 MG PO TABS: 20.0000 mg | ORAL_TABLET | Freq: Every day | ORAL | 2 refills | Status: DC

## 2019-03-09 NOTE — Assessment & Plan Note (Addendum)
Patient presents with questions about her BP medications and BP recheck. BP elevated today at 147/99, 138/97 on repeat. She has not been taking her medications consistently due to reported side effects. She had been switch to chlorthalidone on 08/24/18 due to concern for amlodipine causing her heart to race. She state that the chlorthalidone has made her feel weird and tired. She stated HCTZ was okay, but made her urinate frequently. She has not tried and ACE-I. She was counseled on remaining on birth control while taking an ACE-I due to potential teratogenic side effects. She is current taking Depo-Provera and has no intention of becoming pregnant. - Lisinopril 20mg  Daily - Follow up in 3 weeks for BP check and BMP (labs normal 37mths ago) - Remain on birth control while taking ACE-I - Further titration of Lisinopril as needed, can consider addition of HCTZ if second agent needed due to her tolerating this better than others

## 2019-03-09 NOTE — Assessment & Plan Note (Signed)
Patient report mild left each ache. Exam is benign bilaterally. She states it has only been for 1 day radiates to her occiput and started after a migraine. This may be residual effect of headache vs otitis that has not fully developed. Will monitor. - Return if not improving or worsening in the next 1 week

## 2019-03-09 NOTE — Patient Instructions (Addendum)
Thank you for allowing [place] to care for you  For your Blood pressure - Start taking Lisinopril 20mg  Daily - Stop taking other medications - Remember to stay up to date on your Depo for birth control  Follow up in 2-4 weeks for BP and Lab check

## 2019-03-09 NOTE — Progress Notes (Signed)
   CC: Hypertension, Medication question   HPI:   [name] is a 33 y.o. F with PMHx listed below presenting for HTN. Please see the A&P for the status of the patient's chronic medical problems.   Past Medical History:  Diagnosis Date  . GERD (gastroesophageal reflux disease)   . Hypertension   . IBS (irritable bowel syndrome)   . UTI (lower urinary tract infection)    Review of Systems:  Performed and all others negative.  Physical Exam:  Vitals:   03/09/19 1108 03/09/19 1114 03/09/19 1115  BP: (!) 147/100 (!) 147/99 (!) 138/97  Pulse: 94 86 83  Temp: 99.3 F (37.4 C)    TempSrc: Oral    SpO2: 100%    Weight: 218 lb (98.9 kg)    Height: 5\' 7"  (1.702 m)     Physical Exam Constitutional:      General: She is not in acute distress.    Appearance: Normal appearance.  HENT:     Right Ear: Tympanic membrane, ear canal and external ear normal.     Left Ear: Tympanic membrane, ear canal and external ear normal.  Cardiovascular:     Rate and Rhythm: Normal rate and regular rhythm.     Pulses: Normal pulses.     Heart sounds: Normal heart sounds.  Pulmonary:     Effort: Pulmonary effort is normal. No respiratory distress.     Breath sounds: Normal breath sounds.  Abdominal:     General: Bowel sounds are normal. There is no distension.     Palpations: Abdomen is soft.     Tenderness: There is no abdominal tenderness.  Musculoskeletal:        General: No swelling or deformity.  Skin:    General: Skin is warm and dry.  Neurological:     General: No focal deficit present.     Mental Status: Mental status is at baseline.    Assessment & Plan:   See Encounters Tab for problem based charting.  Patient discussed with Dr. Angelia Mould

## 2019-03-12 ENCOUNTER — Encounter: Payer: Self-pay | Admitting: *Deleted

## 2019-03-14 NOTE — Progress Notes (Signed)
Internal Medicine Clinic Attending  Case discussed with Dr. Melvin  at the time of the visit.  We reviewed the resident's history and exam and pertinent patient test results.  I agree with the assessment, diagnosis, and plan of care documented in the resident's note.  

## 2019-03-31 ENCOUNTER — Ambulatory Visit (INDEPENDENT_AMBULATORY_CARE_PROVIDER_SITE_OTHER): Payer: Medicaid Other | Admitting: Internal Medicine

## 2019-03-31 ENCOUNTER — Encounter: Payer: Self-pay | Admitting: Internal Medicine

## 2019-03-31 ENCOUNTER — Other Ambulatory Visit: Payer: Self-pay

## 2019-03-31 VITALS — BP 162/100 | HR 88 | Temp 98.7°F | Ht 67.0 in | Wt 223.1 lb

## 2019-03-31 DIAGNOSIS — Z9114 Patient's other noncompliance with medication regimen: Secondary | ICD-10-CM | POA: Diagnosis not present

## 2019-03-31 DIAGNOSIS — I10 Essential (primary) hypertension: Secondary | ICD-10-CM | POA: Insufficient documentation

## 2019-03-31 NOTE — Progress Notes (Signed)
   CC: Uncontrolled hypertension  HPI: Raven Webb presents for uncontrolled hypertension.  This is been an ongoing issue for her resulting in trials of multiple medications.  Most recently she was taken off of chlorthalidone due to increased urination as the fact that she did not desire.  At last appointment it appears she was placed on lisinopril. Today she reports that she has not been taking lisinopril due to the side effect of angioedema that she read on the Internet.  Her blood pressure is again rather elevated in the office which is expected given that she has not been on any blood pressure medication for quite some time now.  She has been doing periodic blood pressure checks using the [employer] blood pressure machine.  These readings have actually been a little higher than it is in the office today according to her. We discussed her medications that she has tried and the side effects that she did not like.  I stressed the importance of blood pressure control and educated on long-term effects of uncontrolled hypertension. She does have a strong family history of early onset hypertension in individuals including her mother, her father, a sister, and several second-degree relatives.   Past Medical History:  Diagnosis Date  . GERD (gastroesophageal reflux disease)   . Hypertension   . IBS (irritable bowel syndrome)   . UTI (lower urinary tract infection)    Review of Systems:  negative other than those stated in HPI  Physical Exam:  Vitals:   03/31/19 1313  BP: (!) 162/100  Pulse: 88  Temp: 98.7 F (37.1 C)  TempSrc: Oral  SpO2: 100%  Weight: 223 lb 1.6 oz (101.2 kg)  Height: 5\' 7"  (1.702 m)    GENERAL: well appearing, in no apparent distress HEENT: no conjunctival injection. Nares patent.  CARDIAC: heart regular rate and rhythm, no peripheral edema appreciated PULMONARY: lung sounds clear to auscultation ABDOMEN: bowel sounds active.  SKIN: no rash or lesion on limited  exam NEURO: CN II-XII grossly intact   Assessment & Plan:   See Encounters Tab for problem based charting.  Pertinent labs & imaging results that were available during my care of the patient were reviewed by me and considered in my medical decision making  Diet and weight goals discussed. Patient is in agreement with the plan and endorses no further questions at this time.  Patient seen with Dr. Adolm Joseph, MD Internal Medicine Resident-PGY1 03/31/19

## 2019-03-31 NOTE — Assessment & Plan Note (Signed)
Raven Webb presents today for blood pressure follow-up.  Blood pressure is rather elevated here in the office today however she reports that she has not been taking any of her blood pressure medications due to concern of side effects.  Was recently she was switched from chlorthalidone to lisinopril due to the increased urination as an undesired side effect of the chlorthalidone.  She was researching the lisinopril online and discovered that 1 of the potential side effects was angioedema.  She does not have any family history of this however it concerned her enough to cause her to not take the medication. We discussed the importance of taking antihypertensives and the long-term effects of uncontrolled hypertension.  She expressed understanding at this. She agreed to take the lisinopril so we will try that and have her come back in 4 weeks.  I am also going to get a BMP on her today as she has had uncontrolled hypertension for quite some time and her creatinine appears to be slowly creeping up. Follow-up in 4 weeks.

## 2019-03-31 NOTE — Patient Instructions (Addendum)
Please start taking the lisinopril. And feel free to call us should you develop any concerning symptoms or if you simply have questions about something you see on the Internet so we can hopefully put your mind at ease. We will let you know the results of your lab work. It was a pleasure meeting you today! Take care.

## 2019-04-01 ENCOUNTER — Encounter: Payer: Self-pay | Admitting: Internal Medicine

## 2019-04-01 LAB — BMP8+ANION GAP
Anion Gap: 15 mmol/L (ref 10.0–18.0)
BUN/Creatinine Ratio: 9 (ref 9–23)
BUN: 8 mg/dL (ref 6–20)
CO2: 22 mmol/L (ref 20–29)
Calcium: 9.6 mg/dL (ref 8.7–10.2)
Chloride: 102 mmol/L (ref 96–106)
Creatinine, Ser: 0.94 mg/dL (ref 0.57–1.00)
GFR calc Af Amer: 93 mL/min/{1.73_m2} (ref >59)
GFR calc non Af Amer: 81 mL/min/{1.73_m2} (ref >59)
Glucose: 103 mg/dL — ABNORMAL HIGH (ref 65–99)
Potassium: 3.9 mmol/L (ref 3.5–5.2)
Sodium: 139 mmol/L (ref 134–144)

## 2019-04-06 NOTE — Progress Notes (Signed)
Internal Medicine Clinic Attending  I saw and evaluated the patient.  I personally confirmed the key portions of the history and exam documented by Dr. Christian   and I reviewed pertinent patient test results.  The assessment, diagnosis, and plan were formulated together and I agree with the documentation in the resident's note.  

## 2019-04-25 ENCOUNTER — Other Ambulatory Visit: Payer: Self-pay | Admitting: Internal Medicine

## 2019-04-25 DIAGNOSIS — R05 Cough: Secondary | ICD-10-CM

## 2019-04-25 DIAGNOSIS — R059 Cough, unspecified: Secondary | ICD-10-CM

## 2019-05-16 ENCOUNTER — Telehealth: Payer: Self-pay | Admitting: Family Medicine

## 2019-05-16 NOTE — Telephone Encounter (Signed)
Attempted to reach patient about her appointment. Left a voice message for her to call the office.

## 2019-05-17 ENCOUNTER — Ambulatory Visit (INDEPENDENT_AMBULATORY_CARE_PROVIDER_SITE_OTHER): Payer: Medicaid Other

## 2019-05-17 ENCOUNTER — Other Ambulatory Visit: Payer: Self-pay

## 2019-05-17 VITALS — BP 147/89 | HR 82 | Wt 221.1 lb

## 2019-05-17 DIAGNOSIS — Z3042 Encounter for surveillance of injectable contraceptive: Secondary | ICD-10-CM | POA: Diagnosis not present

## 2019-05-17 MED ORDER — MEDROXYPROGESTERONE ACETATE 150 MG/ML IM SUSP
150.0000 mg | Freq: Once | INTRAMUSCULAR | Status: AC
  Administered 2019-05-17: 15:00:00 150 mg via INTRAMUSCULAR

## 2019-05-17 NOTE — Progress Notes (Signed)
Raven Webb here for Depo-Provera  Injection.  Injection administered without complication. Patient will return in 3 months for next injection.  Verdell Carmine, RN 05/17/2019  3:23 PM

## 2019-06-19 ENCOUNTER — Other Ambulatory Visit: Payer: Self-pay | Admitting: Internal Medicine

## 2019-06-19 DIAGNOSIS — I10 Essential (primary) hypertension: Secondary | ICD-10-CM

## 2019-07-13 ENCOUNTER — Other Ambulatory Visit: Payer: Self-pay | Admitting: Internal Medicine

## 2019-07-13 DIAGNOSIS — R059 Cough, unspecified: Secondary | ICD-10-CM

## 2019-07-13 DIAGNOSIS — R05 Cough: Secondary | ICD-10-CM

## 2019-08-03 ENCOUNTER — Ambulatory Visit: Payer: Medicaid Other

## 2019-08-04 ENCOUNTER — Other Ambulatory Visit: Payer: Self-pay

## 2019-08-04 ENCOUNTER — Ambulatory Visit (INDEPENDENT_AMBULATORY_CARE_PROVIDER_SITE_OTHER): Payer: Medicaid Other | Admitting: Lactation Services

## 2019-08-04 DIAGNOSIS — Z3042 Encounter for surveillance of injectable contraceptive: Secondary | ICD-10-CM | POA: Diagnosis not present

## 2019-08-04 MED ORDER — MEDROXYPROGESTERONE ACETATE 150 MG/ML IM SUSP
150.0000 mg | Freq: Once | INTRAMUSCULAR | Status: AC
  Administered 2019-08-04: 150 mg via INTRAMUSCULAR

## 2019-08-04 NOTE — Progress Notes (Signed)
Raven Webb here  for Depo-Provera  Injection.  Injection administered without complication. Patient will return in 3 months for next injection.  Pt tolerated it well. Pt to return between Jan 28-Feb. 11.   Donn Pierini, RN 08/04/2019  3:38 PM

## 2019-08-08 ENCOUNTER — Encounter: Payer: Self-pay | Admitting: Internal Medicine

## 2019-08-08 ENCOUNTER — Ambulatory Visit: Payer: Medicaid Other

## 2019-08-08 ENCOUNTER — Ambulatory Visit: Payer: Medicaid Other | Admitting: Internal Medicine

## 2019-08-08 ENCOUNTER — Other Ambulatory Visit: Payer: Self-pay

## 2019-08-08 ENCOUNTER — Ambulatory Visit (HOSPITAL_COMMUNITY)
Admission: RE | Admit: 2019-08-08 | Discharge: 2019-08-08 | Disposition: A | Discharge status: Home / Self Care | Payer: Medicaid Other | Source: Ambulatory Visit | Attending: Internal Medicine | Admitting: Internal Medicine

## 2019-08-08 VITALS — BP 159/99 | HR 76 | Temp 98.7°F | Ht 67.0 in | Wt 223.0 lb

## 2019-08-08 DIAGNOSIS — Z79899 Other long term (current) drug therapy: Secondary | ICD-10-CM

## 2019-08-08 DIAGNOSIS — M25562 Pain in left knee: Secondary | ICD-10-CM

## 2019-08-08 DIAGNOSIS — Z833 Family history of diabetes mellitus: Secondary | ICD-10-CM | POA: Diagnosis not present

## 2019-08-08 DIAGNOSIS — I10 Essential (primary) hypertension: Secondary | ICD-10-CM

## 2019-08-08 DIAGNOSIS — M25569 Pain in unspecified knee: Secondary | ICD-10-CM

## 2019-08-08 DIAGNOSIS — R079 Chest pain, unspecified: Secondary | ICD-10-CM | POA: Insufficient documentation

## 2019-08-08 DIAGNOSIS — R0602 Shortness of breath: Secondary | ICD-10-CM | POA: Diagnosis not present

## 2019-08-08 DIAGNOSIS — R9431 Abnormal electrocardiogram [ECG] [EKG]: Secondary | ICD-10-CM | POA: Diagnosis not present

## 2019-08-08 DIAGNOSIS — R350 Frequency of micturition: Secondary | ICD-10-CM

## 2019-08-08 DIAGNOSIS — Z131 Encounter for screening for diabetes mellitus: Secondary | ICD-10-CM | POA: Diagnosis not present

## 2019-08-08 DIAGNOSIS — R7303 Prediabetes: Secondary | ICD-10-CM | POA: Insufficient documentation

## 2019-08-08 DIAGNOSIS — G8929 Other chronic pain: Secondary | ICD-10-CM

## 2019-08-08 DIAGNOSIS — E119 Type 2 diabetes mellitus without complications: Secondary | ICD-10-CM | POA: Insufficient documentation

## 2019-08-08 DIAGNOSIS — E118 Type 2 diabetes mellitus with unspecified complications: Secondary | ICD-10-CM | POA: Insufficient documentation

## 2019-08-08 LAB — POCT GLYCOSYLATED HEMOGLOBIN (HGB A1C): Hemoglobin A1C: 5.7 % — AB (ref 4.0–5.6)

## 2019-08-08 LAB — GLUCOSE, CAPILLARY: Glucose-Capillary: 114 mg/dL — ABNORMAL HIGH (ref 70–99)

## 2019-08-08 MED ORDER — CHLORTHALIDONE 25 MG PO TABS: 25.0000 mg | ORAL_TABLET | Freq: Every day | ORAL | 2 refills | Status: DC

## 2019-08-08 NOTE — Patient Instructions (Addendum)
You were seen in clinic for follow-up on your blood pressure.  Given that your blood pressure was still elevated, we have sent a prescription for chlorthalidone 25 mg daily.  We want you to continue taking lisinopril 20 mg daily.  If you experience side effects of these medications, please call the clinic and we can try alternative therapy.  We want you to return to clinic in 4 weeks, to recheck your blood pressure.  Because of your chest pain, we performed an EKG in clinic which was normal.  Regarding your increased urination, we collected blood to check your sugar levels (hemoglobin A1C), since elevated levels can increase urination.  We have also collected urine that can be analyzed for signs of infection.  We will call you if there are any abnormalities.  For your knee pain, we have placed a referral to physical therapy.  They can teach you exercises which can be beneficial to increasing your strength and reducing your pain.

## 2019-08-08 NOTE — Assessment & Plan Note (Signed)
Patient previously was discontinued from chlorthalidone due to increased urination.  After discontinuation, patient has continued increased frequency of urination.  This may be secondary to diabetes for which patient has not been screened, patient has family history of diabetes.  Could also consider infection. *POC hemoglobin A1c ordered + urinalysis

## 2019-08-08 NOTE — Progress Notes (Signed)
   CC: Blood pressure follow-up  HPI: Patient is a 33 year old female with past medical history of hypertension who presents for blood pressure follow-up as well as complaints of 2 weeks of chest pain and continued urinary frequency.  [name] is a 33 y.o.   Past Medical History:  Diagnosis Date  . GERD (gastroesophageal reflux disease)   . Hypertension   . IBS (irritable bowel syndrome)   . UTI (lower urinary tract infection)    Review of Systems:   Review of Systems  Constitutional: Negative for chills and fever.  HENT: Negative for congestion.   Respiratory: Positive for shortness of breath. Negative for cough.   Cardiovascular: Positive for chest pain.  Gastrointestinal: Negative for abdominal pain, constipation, diarrhea, nausea and vomiting.  Genitourinary: Negative for dysuria, frequency and urgency.  All other systems reviewed and are negative.   Physical Exam:  Vitals:   08/08/19 1315  BP: (!) 159/99  Pulse: 76  Temp: 98.7 F (37.1 C)  TempSrc: Oral  SpO2: 100%  Weight: 223 lb (101.2 kg)  Height: 5\' 7"  (1.702 m)   Physical Exam  Constitutional: She is well-developed, well-nourished, and in no distress.  HENT:  Head: Normocephalic and atraumatic.  Eyes: EOM are normal. Right eye exhibits no discharge. Left eye exhibits no discharge.  Neck: Normal range of motion. No tracheal deviation present.  Cardiovascular: Normal rate and regular rhythm. Exam reveals no gallop and no friction rub.  No murmur heard. Pulmonary/Chest: Effort normal and breath sounds normal. No respiratory distress. She has no wheezes. She has no rales.  Abdominal: Soft. She exhibits no distension. There is no abdominal tenderness. There is no rebound and no guarding.  Musculoskeletal: Normal range of motion.        General: No tenderness, deformity or edema.  Neurological: She is alert. Coordination normal.  Skin: Skin is warm and dry. No rash noted. She is not diaphoretic. No  erythema.  Psychiatric: Memory and judgment normal.      Assessment & Plan:   See Encounters Tab for problem based charting.  Patient seen and examined with Dr. Lynnae January

## 2019-08-08 NOTE — Assessment & Plan Note (Signed)
Screening for diabetes mellitus indicated due to age and BMI.  Patient also with family history of diabetes mellitus as well as urinary frequency. *POC hemoglobin A1c ordered

## 2019-08-08 NOTE — Assessment & Plan Note (Addendum)
Patient reported a 2-week history of mild shortness of breath with exertion as well as a pressure-like chest pain.  Pain comes and goes, is nonradiating.  Reports pain improves when applying pressure to the chest, or sitting up.  Patient denies orthopnea or PND.  Patient denies smoking history, personal or family history of heart attack.  EKG performed in clinic and was without signs of acute ischemia.

## 2019-08-08 NOTE — Assessment & Plan Note (Signed)
Patient presents for blood pressure follow-up.  Blood pressure 159/99, value of 162/100 at last visit.  Patient was previously switched from chlorthalidone to lisinopril due to increased urination.  Patient reports taking lisinopril 20 mg daily as prescribed, denies missing any doses.  Patient reports continued high frequency urination.  Given continued noncontrolled blood pressure, patient is open to trying chlorthalidone again. *Chlorthalidone 25 mg daily + lisinopril 20 mg daily *See urinary frequency problem

## 2019-08-09 LAB — URINALYSIS, ROUTINE W REFLEX MICROSCOPIC
Bilirubin, UA: NEGATIVE
Glucose, UA: NEGATIVE
Ketones, UA: NEGATIVE
Nitrite, UA: NEGATIVE
Protein,UA: NEGATIVE
RBC, UA: NEGATIVE
Specific Gravity, UA: 1.024 (ref 1.005–1.030)
Urobilinogen, Ur: 0.2 mg/dL (ref 0.2–1.0)
pH, UA: 5 (ref 5.0–7.5)

## 2019-08-09 LAB — MICROSCOPIC EXAMINATION: RBC, Urine: NONE SEEN /hpf (ref 0–2)

## 2019-08-10 NOTE — Progress Notes (Signed)
Internal Medicine Clinic Attending  I saw and evaluated the patient.  I personally confirmed the key portions of the history and exam documented by Dr. MacLean and I reviewed pertinent patient test results.  The assessment, diagnosis, and plan were formulated together and I agree with the documentation in the resident's note.  

## 2019-08-11 ENCOUNTER — Other Ambulatory Visit: Payer: Self-pay | Admitting: Internal Medicine

## 2019-08-11 DIAGNOSIS — N390 Urinary tract infection, site not specified: Secondary | ICD-10-CM

## 2019-08-11 MED ORDER — NITROFURANTOIN MONOHYD MACRO 100 MG PO CAPS: 100.0000 mg | ORAL_CAPSULE | Freq: Two times a day (BID) | ORAL | 0 refills | Status: AC

## 2019-08-11 NOTE — Progress Notes (Signed)
Called patient with results  UA: 11-30 WBC, few bacteria present  Given UA results, HgbA1C negative for diabetes mellitus, and symptom of urinary frequency will treat with a 3 day course of macrobid for UTI.   Jeanmarie Hubert, MD 08/11/2019, 3:36 PM Pager: [phone]

## 2019-08-23 ENCOUNTER — Ambulatory Visit: Payer: Medicaid Other | Attending: Internal Medicine | Admitting: Physical Therapy

## 2019-08-23 NOTE — Addendum Note (Signed)
Addended by: Hulan Fray on: 08/23/2019 05:33 PM   Modules accepted: Orders

## 2019-09-05 ENCOUNTER — Encounter: Payer: Self-pay | Admitting: Internal Medicine

## 2019-09-07 ENCOUNTER — Ambulatory Visit: Payer: Medicaid Other

## 2019-09-28 ENCOUNTER — Encounter: Payer: Self-pay | Admitting: Internal Medicine

## 2019-09-28 ENCOUNTER — Other Ambulatory Visit: Payer: Self-pay

## 2019-09-28 ENCOUNTER — Ambulatory Visit: Payer: Medicaid Other | Admitting: Internal Medicine

## 2019-09-28 VITALS — BP 152/110 | HR 88 | Temp 98.7°F | Ht 67.0 in | Wt 216.8 lb

## 2019-09-28 DIAGNOSIS — R1031 Right lower quadrant pain: Secondary | ICD-10-CM | POA: Diagnosis not present

## 2019-09-28 DIAGNOSIS — Z79899 Other long term (current) drug therapy: Secondary | ICD-10-CM | POA: Diagnosis not present

## 2019-09-28 DIAGNOSIS — I10 Essential (primary) hypertension: Secondary | ICD-10-CM | POA: Diagnosis not present

## 2019-09-28 DIAGNOSIS — R109 Unspecified abdominal pain: Secondary | ICD-10-CM | POA: Insufficient documentation

## 2019-09-28 DIAGNOSIS — R197 Diarrhea, unspecified: Secondary | ICD-10-CM | POA: Diagnosis not present

## 2019-09-28 DIAGNOSIS — R12 Heartburn: Secondary | ICD-10-CM

## 2019-09-28 DIAGNOSIS — R1033 Periumbilical pain: Secondary | ICD-10-CM

## 2019-09-28 DIAGNOSIS — G8929 Other chronic pain: Secondary | ICD-10-CM | POA: Diagnosis not present

## 2019-09-28 DIAGNOSIS — R110 Nausea: Secondary | ICD-10-CM

## 2019-09-28 DIAGNOSIS — R1032 Left lower quadrant pain: Secondary | ICD-10-CM | POA: Diagnosis not present

## 2019-09-28 DIAGNOSIS — R7303 Prediabetes: Secondary | ICD-10-CM

## 2019-09-28 MED ORDER — CHLORTHALIDONE 25 MG PO TABS: 12.5000 mg | ORAL_TABLET | Freq: Every day | ORAL | 2 refills | Status: DC

## 2019-09-28 MED ORDER — PANTOPRAZOLE SODIUM 40 MG PO TBEC: 40.0000 mg | DELAYED_RELEASE_TABLET | Freq: Every day | ORAL | 1 refills | Status: DC

## 2019-09-28 NOTE — Patient Instructions (Addendum)
Thank you, Raven Webb for allowing [place] to provide your care today. Today we discussed HTN.    I have ordered no labs for you. I will call if any are abnormal.    I have place a referrals to none.   I have ordered the following tests: none   I have ordered the following medication/changed the following medications:  1. Chlorthalidone take 1/2 pill daily  2. Pnatopropazole 40 mg daily for 4 weeks 3. Gas X with each meal as needed 4. Please keep a food diary with meals each day and daily/nightly symptoms.  Please follow-up in 4 weeks.    Should you have any questions or concerns please call the internal medicine clinic at [phone].    Dellia Cloud, D.O. [facility]

## 2019-09-28 NOTE — Progress Notes (Signed)
   CC: HTN  HPI:  [name] is a 34 y.o. female with a past medical history stated below and presents today for HTN follow up after starting a new medication. Please see problem based assessment and plan for additional details.   Past Medical History:  Diagnosis Date  . GERD (gastroesophageal reflux disease)   . Hypertension   . IBS (irritable bowel syndrome)   . UTI (lower urinary tract infection)      Review of Systems: Review of Systems  Constitutional: Negative for chills, fever and malaise/fatigue.  Respiratory: Negative for cough and shortness of breath.   Cardiovascular: Negative for chest pain, orthopnea and leg swelling.  Gastrointestinal: Positive for abdominal pain, diarrhea, heartburn and nausea. Negative for blood in stool, melena and vomiting.  Genitourinary: Negative for dysuria, frequency, hematuria and urgency.  Neurological: Negative for weakness.     There were no vitals filed for this visit.   Physical Exam: Physical Exam  Constitutional: She is oriented to person, place, and time and well-developed, well-nourished, and in no distress.  HENT:  Head: Normocephalic and atraumatic.  Eyes: EOM are normal.  Cardiovascular: Normal rate, regular rhythm and intact distal pulses. Exam reveals no gallop and no friction rub.  No murmur heard. Pulmonary/Chest: Effort normal and breath sounds normal. No respiratory distress. She exhibits no tenderness.  Abdominal: Soft. Bowel sounds are normal. She exhibits no distension. There is no abdominal tenderness.  Musculoskeletal:        General: No tenderness or edema. Normal range of motion.     Cervical back: Normal range of motion.  Neurological: She is alert and oriented to person, place, and time.  Skin: Skin is warm and dry.     Assessment & Plan:   See Encounters Tab for problem based charting.  Patient discussed with Dr. Criselda Peaches

## 2019-09-29 NOTE — Assessment & Plan Note (Addendum)
Patient blood pressure was elevated today at 152/110. Although elevated today at the clinic the patient states that she has had some symptomatic hypotension recently. Looking back through her history it is apparent that she has had a difficult time controlling her HTN. The symptoms she is explaining are likely 2/2 to decreased PO intake in the setting of her GI symptoms. I initially decreased her HCTZ, but she will likely need to return to her original dose of 25 mg daily. I will call the patient to discuss this with her further.

## 2019-09-30 NOTE — Assessment & Plan Note (Addendum)
Presents with chronic abdominal pain has been present for roughly a year off and on. It recurred around christmas time. She states that pain is periumbilical and bilateral lower quadrants. She describes the abdominal pain as dyspepsia with bloating and cramping. She also has substernal chest pain that feels like her GERD symptoms that she was previously diagnosed with. She often tried to beltch to improve these symptoms. The pain does not radiate. The pain is worse at night. She notices right before she goes to bed, an hour after laying down she will suddenly get nauseated and feel like she is going to vomit without vomiting. The pain will last all night and interferes with her sleep. She has tried nausea and acid reflux medication without success. She was previously prescribed Prilosec and states that she took it loyally for several weeks without success. Throughout the years of dealing with this pain, she states that often times she will eat food and immediately have to have a loose BM. This can occur 1-2 times a day, but she finds it difficult to quantify as her symptoms have caused her to have decreased PO intake. She was previously diagnosed with IBS, but not specified as constipation vs diarrhea predominant. She has never seen a gastroenterologist. Recently she has only had 1 BM this week which is usually for her.   She denies dysphagia, odynphagia, hematemesis, melena, she does not drink ETOH, no illicit drugs, she mostly eats baked or air fried foods. Other than water, she only drink ginger ale or juice. She has never had an endoscopy. She take NSAIDs infrequently She denies dysuria, hematuria, on depo provera for birth control, up to date on pap smears.   Differential diagnosis included: IBS unspecified vs SIBO, resident GERD, functional dyspepsia/bloating.  Plan: - Counseled the patient on her symptoms and the use of OTC medications like GASx after meals and imodium when she experience  diarrhea/loose stools after eating - Prescribed protonix 40 mg for 4 weeks - Instructed her to keep a food diarrhea and track her symptoms.  - We will follow up in 4 weeks to discuss her food diary and make medications changes.

## 2019-10-03 NOTE — Progress Notes (Signed)
Internal Medicine Clinic Attending  Case discussed with Dr. Coe at the time of the visit.  We reviewed the resident's history and exam and pertinent patient test results.  I agree with the assessment, diagnosis, and plan of care documented in the resident's note.    

## 2019-10-19 ENCOUNTER — Ambulatory Visit: Payer: Medicaid Other

## 2019-10-19 ENCOUNTER — Encounter: Payer: Self-pay | Admitting: Internal Medicine

## 2019-10-20 ENCOUNTER — Ambulatory Visit: Payer: Medicaid Other

## 2019-10-21 ENCOUNTER — Other Ambulatory Visit: Payer: Self-pay | Admitting: Internal Medicine

## 2019-10-21 MED ORDER — DICYCLOMINE HCL 10 MG PO CAPS: 10.0000 mg | ORAL_CAPSULE | Freq: Four times a day (QID) | ORAL | 0 refills | Status: DC

## 2019-10-21 NOTE — Telephone Encounter (Signed)
Based on her history, she might have a mix form of IBS. She states that she a history of loose runny stools, but was currently constipated leading up to the visit.

## 2019-11-01 ENCOUNTER — Encounter: Payer: Medicaid Other | Admitting: Internal Medicine

## 2019-11-02 ENCOUNTER — Other Ambulatory Visit: Payer: Self-pay | Admitting: Internal Medicine

## 2019-11-02 DIAGNOSIS — I10 Essential (primary) hypertension: Secondary | ICD-10-CM

## 2019-11-02 NOTE — Telephone Encounter (Signed)
Please advise.  Patient was sch an 11/01/2019 to see you and resch to 11/08/2019.  Does this patient need to keep this appointment in addition to the appointment in 6-8 weeks per your request.

## 2019-11-02 NOTE — Telephone Encounter (Signed)
Please have patient arrange an appointment in 6-8w for blood pressure recheck.

## 2019-11-03 ENCOUNTER — Ambulatory Visit: Payer: Medicaid Other

## 2019-11-08 ENCOUNTER — Ambulatory Visit (INDEPENDENT_AMBULATORY_CARE_PROVIDER_SITE_OTHER): Payer: Medicaid Other | Admitting: Internal Medicine

## 2019-11-08 ENCOUNTER — Encounter: Payer: Self-pay | Admitting: Internal Medicine

## 2019-11-08 ENCOUNTER — Other Ambulatory Visit: Payer: Self-pay

## 2019-11-08 VITALS — BP 166/104 | HR 73 | Temp 98.4°F | Wt 214.4 lb

## 2019-11-08 DIAGNOSIS — I10 Essential (primary) hypertension: Secondary | ICD-10-CM

## 2019-11-08 DIAGNOSIS — R110 Nausea: Secondary | ICD-10-CM | POA: Diagnosis not present

## 2019-11-08 DIAGNOSIS — R109 Unspecified abdominal pain: Secondary | ICD-10-CM | POA: Diagnosis not present

## 2019-11-08 DIAGNOSIS — Z79899 Other long term (current) drug therapy: Secondary | ICD-10-CM

## 2019-11-08 DIAGNOSIS — R1084 Generalized abdominal pain: Secondary | ICD-10-CM

## 2019-11-08 MED ORDER — PANTOPRAZOLE SODIUM 40 MG PO TBEC: 40.0000 mg | DELAYED_RELEASE_TABLET | Freq: Every day | ORAL | 1 refills | Status: DC

## 2019-11-08 MED ORDER — CHLORTHALIDONE 25 MG PO TABS: 12.5000 mg | ORAL_TABLET | Freq: Every day | ORAL | 2 refills | Status: DC

## 2019-11-08 NOTE — Progress Notes (Signed)
   CC: abdominal pain, chronic poorly controlled hypertension  HPI:  [name] is a 34 y.o. female who presents for follow up on abdominal pain and chronic hypertension management.  Please see problem based assessment and plan for additional details.     Past Medical History:  Diagnosis Date  . GERD (gastroesophageal reflux disease)   . Hypertension   . IBS (irritable bowel syndrome)   . UTI (lower urinary tract infection)     Review of Systems:  Review of Systems - General ROS: negative for - chills, fatigue or fever Cardiovascular ROS: no chest pain or dyspnea on exertion Gastrointestinal ROS: no abdominal pain, change in bowel habits, or black or bloody stools   Physical Exam:  Vitals:   11/08/19 1550  BP: (!) 166/104  Pulse: 73  Temp: 98.4 F (36.9 C)  TempSrc: Oral  SpO2: 100%  Weight: 214 lb 6.4 oz (97.3 kg)    GENERAL: well appearing, in no apparent distress ABDOMEN: non-distended. SKIN: no rash or lesion on limited exam   Assessment & Plan:   Poorly controlled hypertension Medications: chlorthalidone 25mg  daily, lisinopril 20mg  daily At last [facility] visit, she was prescribed chlorthalidone for continued poorly controlled hypertension. Unfortunately, she was told that the pharmacy did not have the prescription available for her so she has just continued taking the lisinopril.  Denies chest pain or headache in the office today. Assessment: BP in the office today is moderately elevated. BMP today was unremarkable.  Plan -Resent prescription to pharmacy and discussed with her that if she is still unable to pick up the prescription, she should let me know.  Abdominal pain/nausea  She was seen at last visit for this issue and was prescribed bentyl and instructed to keep a food diary. She notes that it is immediately post-prandial. The abdominal pain is a cramping sensation in her lower abdomen and lower back and is associated with bloating. After talking with  her, it seems that it is mostly associated with greasy food however over the past year, more and more things have started to bother her. She denies diarrhea, fever, chills or sick contacts. No known FH of autoimmune disorders. She notes that the bentyl did not help. She has started only eating once a day typically to avoid these symptoms that she described. Assessment: unclear etiology of her symptoms. Given the association with fatty foods, choledocholithiasis is a consideration. Her recent labs have been unremarkable. Other considerations would be gastroparesis although this would be very abnormal in someone without risk factors. PUD may also be a consideration however symptoms are not entirely consistent with this either.  Plan:  -will obtain a RUQ -if symptoms persist, will refer to GI for further evaluation.  Patient is in agreement with the plan and endorses no further questions at this time.  Patient discussed with Dr. [facility], MD Internal Medicine Resident-PGY1 11/09/19

## 2019-11-08 NOTE — Patient Instructions (Signed)
It was nice seeing you again today. I'm sorry to hear about your abdominal pain but I think we need to do some further investigation to get to the bottom of it. I'd like to start with an ultrasound of your gallbladder.  In regards to your blood pressure, I have resent that new blood pressure medicine which I would like you to start taking in addition to the lisinopril. Please come back in 4 weeks at which time we can recheck your blood work and go from there.

## 2019-11-09 ENCOUNTER — Encounter: Payer: Self-pay | Admitting: Internal Medicine

## 2019-11-09 LAB — BMP8+ANION GAP
Anion Gap: 15 mmol/L (ref 10.0–18.0)
BUN/Creatinine Ratio: 7 — ABNORMAL LOW (ref 9–23)
BUN: 7 mg/dL (ref 6–20)
CO2: 24 mmol/L (ref 20–29)
Calcium: 9.7 mg/dL (ref 8.7–10.2)
Chloride: 101 mmol/L (ref 96–106)
Creatinine, Ser: 0.96 mg/dL (ref 0.57–1.00)
GFR calc Af Amer: 91 mL/min/{1.73_m2} (ref >59)
GFR calc non Af Amer: 79 mL/min/{1.73_m2} (ref >59)
Glucose: 78 mg/dL (ref 65–99)
Potassium: 3.5 mmol/L (ref 3.5–5.2)
Sodium: 140 mmol/L (ref 134–144)

## 2019-11-14 NOTE — Progress Notes (Signed)
Internal Medicine Clinic Attending  Case discussed with Dr. Christian at the time of the visit.  We reviewed the resident's history and exam and pertinent patient test results.  I agree with the assessment, diagnosis, and plan of care documented in the resident's note.    

## 2019-11-16 ENCOUNTER — Ambulatory Visit (HOSPITAL_COMMUNITY)
Admission: RE | Admit: 2019-11-16 | Discharge: 2019-11-16 | Disposition: A | Discharge status: Home / Self Care | Payer: Medicaid Other | Source: Ambulatory Visit | Attending: Student in an Organized Health Care Education/Training Program | Admitting: Student in an Organized Health Care Education/Training Program

## 2019-11-16 ENCOUNTER — Other Ambulatory Visit: Payer: Self-pay

## 2019-11-16 DIAGNOSIS — R1084 Generalized abdominal pain: Secondary | ICD-10-CM

## 2019-11-16 DIAGNOSIS — R10819 Abdominal tenderness, unspecified site: Secondary | ICD-10-CM | POA: Diagnosis not present

## 2019-11-17 ENCOUNTER — Encounter: Payer: Self-pay | Admitting: Internal Medicine

## 2019-11-17 NOTE — Addendum Note (Signed)
Addended by: Elige Radon on: 11/17/2019 07:43 AM   Modules accepted: Orders

## 2019-11-17 NOTE — Progress Notes (Signed)
Hey! I just wanted to let you know that I put in a referral to GI for her. Thanks

## 2019-11-30 ENCOUNTER — Encounter: Payer: Self-pay | Admitting: Internal Medicine

## 2019-11-30 NOTE — Telephone Encounter (Signed)
Can you check and see if her referral for GI has gone through?

## 2019-12-07 ENCOUNTER — Other Ambulatory Visit: Payer: Self-pay

## 2019-12-07 ENCOUNTER — Ambulatory Visit (INDEPENDENT_AMBULATORY_CARE_PROVIDER_SITE_OTHER): Payer: Medicaid Other | Admitting: *Deleted

## 2019-12-07 ENCOUNTER — Ambulatory Visit: Payer: Medicaid Other

## 2019-12-07 DIAGNOSIS — Z3200 Encounter for pregnancy test, result unknown: Secondary | ICD-10-CM

## 2019-12-07 DIAGNOSIS — Z3202 Encounter for pregnancy test, result negative: Secondary | ICD-10-CM

## 2019-12-07 DIAGNOSIS — Z3042 Encounter for surveillance of injectable contraceptive: Secondary | ICD-10-CM

## 2019-12-07 LAB — POCT PREGNANCY, URINE: Preg Test, Ur: NEGATIVE

## 2019-12-07 MED ORDER — MEDROXYPROGESTERONE ACETATE 150 MG/ML IM SUSP
150.0000 mg | Freq: Once | INTRAMUSCULAR | Status: AC
  Administered 2019-12-07: 15:00:00 150 mg via INTRAMUSCULAR

## 2019-12-07 NOTE — Progress Notes (Signed)
Patient seen and assessed by nursing staff during this encounter. I have reviewed the chart and agree with the documentation and plan.  Beltrami Bing, MD 12/07/2019 2:52 PM

## 2019-12-07 NOTE — Progress Notes (Signed)
Here for depoprovera . Was due 10/20/19- 11/03/19. Upt negative . Verifies has not unprotected intercourse in the last 2 years. Per protocol may resume depo-provera. Advised to use backup birth control if she has intercourse in the next month . She voices understanding. Depo- provera given without complaint. Legrand Como

## 2019-12-14 ENCOUNTER — Other Ambulatory Visit: Payer: Self-pay | Admitting: Gastroenterology

## 2019-12-14 ENCOUNTER — Other Ambulatory Visit (HOSPITAL_COMMUNITY): Payer: Self-pay | Admitting: Gastroenterology

## 2019-12-14 DIAGNOSIS — R197 Diarrhea, unspecified: Secondary | ICD-10-CM | POA: Diagnosis not present

## 2019-12-14 DIAGNOSIS — R1011 Right upper quadrant pain: Secondary | ICD-10-CM

## 2019-12-14 DIAGNOSIS — R634 Abnormal weight loss: Secondary | ICD-10-CM | POA: Diagnosis not present

## 2019-12-14 DIAGNOSIS — R110 Nausea: Secondary | ICD-10-CM | POA: Diagnosis not present

## 2019-12-29 DIAGNOSIS — R110 Nausea: Secondary | ICD-10-CM | POA: Diagnosis not present

## 2019-12-29 DIAGNOSIS — R634 Abnormal weight loss: Secondary | ICD-10-CM | POA: Diagnosis not present

## 2019-12-29 DIAGNOSIS — B9681 Helicobacter pylori [H. pylori] as the cause of diseases classified elsewhere: Secondary | ICD-10-CM | POA: Diagnosis not present

## 2019-12-29 DIAGNOSIS — K2950 Unspecified chronic gastritis without bleeding: Secondary | ICD-10-CM | POA: Diagnosis not present

## 2019-12-30 ENCOUNTER — Ambulatory Visit: Payer: Medicaid Other | Admitting: Student

## 2020-01-04 ENCOUNTER — Other Ambulatory Visit: Payer: Self-pay

## 2020-01-04 ENCOUNTER — Encounter (HOSPITAL_COMMUNITY)
Admission: RE | Admit: 2020-01-04 | Discharge: 2020-01-04 | Disposition: A | Discharge status: Home / Self Care | Payer: Medicaid Other | Source: Ambulatory Visit | Attending: Gastroenterology | Admitting: Gastroenterology

## 2020-01-04 DIAGNOSIS — R1011 Right upper quadrant pain: Secondary | ICD-10-CM | POA: Diagnosis not present

## 2020-01-04 MED ORDER — TECHNETIUM TC 99M MEBROFENIN IV KIT
5.4000 | PACK | Freq: Once | INTRAVENOUS | Status: AC | PRN
  Administered 2020-01-04: 10:00:00 5.4 via INTRAVENOUS

## 2020-01-05 ENCOUNTER — Other Ambulatory Visit: Payer: Self-pay | Admitting: Internal Medicine

## 2020-01-05 DIAGNOSIS — R059 Cough, unspecified: Secondary | ICD-10-CM

## 2020-01-05 DIAGNOSIS — R05 Cough: Secondary | ICD-10-CM

## 2020-01-05 MED ORDER — FLUTICASONE PROPIONATE 50 MCG/ACT NA SUSP: 1.0000 | Freq: Every day | NASAL | 1 refills | Status: DC

## 2020-01-25 DIAGNOSIS — K828 Other specified diseases of gallbladder: Secondary | ICD-10-CM | POA: Diagnosis not present

## 2020-01-26 ENCOUNTER — Encounter (HOSPITAL_COMMUNITY): Payer: Self-pay | Admitting: Emergency Medicine

## 2020-01-26 ENCOUNTER — Emergency Department (HOSPITAL_COMMUNITY)
Admission: EM | Admit: 2020-01-26 | Discharge: 2020-01-26 | Disposition: A | Discharge status: Home / Self Care | Payer: Medicaid Other | Attending: Emergency Medicine | Admitting: Emergency Medicine

## 2020-01-26 ENCOUNTER — Other Ambulatory Visit: Payer: Self-pay

## 2020-01-26 DIAGNOSIS — R112 Nausea with vomiting, unspecified: Secondary | ICD-10-CM | POA: Diagnosis not present

## 2020-01-26 DIAGNOSIS — M25511 Pain in right shoulder: Secondary | ICD-10-CM | POA: Diagnosis not present

## 2020-01-26 DIAGNOSIS — Z6832 Body mass index (BMI) 32.0-32.9, adult: Secondary | ICD-10-CM | POA: Insufficient documentation

## 2020-01-26 DIAGNOSIS — I10 Essential (primary) hypertension: Secondary | ICD-10-CM | POA: Diagnosis not present

## 2020-01-26 DIAGNOSIS — R110 Nausea: Secondary | ICD-10-CM | POA: Diagnosis not present

## 2020-01-26 DIAGNOSIS — E669 Obesity, unspecified: Secondary | ICD-10-CM | POA: Diagnosis not present

## 2020-01-26 DIAGNOSIS — R1011 Right upper quadrant pain: Secondary | ICD-10-CM

## 2020-01-26 DIAGNOSIS — M542 Cervicalgia: Secondary | ICD-10-CM | POA: Diagnosis not present

## 2020-01-26 LAB — URINALYSIS, ROUTINE W REFLEX MICROSCOPIC
Bilirubin Urine: NEGATIVE
Glucose, UA: NEGATIVE mg/dL
Hgb urine dipstick: NEGATIVE
Ketones, ur: NEGATIVE mg/dL
Leukocytes,Ua: NEGATIVE
Nitrite: NEGATIVE
Protein, ur: NEGATIVE mg/dL
Specific Gravity, Urine: 1.011 (ref 1.005–1.030)
pH: 6 (ref 5.0–8.0)

## 2020-01-26 LAB — CBC
HCT: 41.6 % (ref 36.0–46.0)
Hemoglobin: 13.7 g/dL (ref 12.0–15.0)
MCH: 28.8 pg (ref 26.0–34.0)
MCHC: 32.9 g/dL (ref 30.0–36.0)
MCV: 87.6 fL (ref 80.0–100.0)
Platelets: 471 10*3/uL — ABNORMAL HIGH (ref 150–400)
RBC: 4.75 MIL/uL (ref 3.87–5.11)
RDW: 13.2 % (ref 11.5–15.5)
WBC: 5.9 10*3/uL (ref 4.0–10.5)
nRBC: 0 % (ref 0.0–0.2)

## 2020-01-26 LAB — COMPREHENSIVE METABOLIC PANEL
ALT: 12 U/L (ref 0–44)
AST: 15 U/L (ref 15–41)
Albumin: 4 g/dL (ref 3.5–5.0)
Alkaline Phosphatase: 128 U/L — ABNORMAL HIGH (ref 38–126)
Anion gap: 9 (ref 5–15)
BUN: 7 mg/dL (ref 6–20)
CO2: 25 mmol/L (ref 22–32)
Calcium: 9.5 mg/dL (ref 8.9–10.3)
Chloride: 103 mmol/L (ref 98–111)
Creatinine, Ser: 0.97 mg/dL (ref 0.44–1.00)
GFR calc Af Amer: >60 mL/min (ref >60)
GFR calc non Af Amer: >60 mL/min (ref >60)
Glucose, Bld: 98 mg/dL (ref 70–99)
Potassium: 3.3 mmol/L — ABNORMAL LOW (ref 3.5–5.1)
Sodium: 137 mmol/L (ref 135–145)
Total Bilirubin: 0.6 mg/dL (ref 0.3–1.2)
Total Protein: 7.8 g/dL (ref 6.5–8.1)

## 2020-01-26 LAB — I-STAT BETA HCG BLOOD, ED (MC, WL, AP ONLY): I-stat hCG, quantitative: <5 m[IU]/mL (ref <5)

## 2020-01-26 LAB — LIPASE, BLOOD: Lipase: 19 U/L (ref 11–51)

## 2020-01-26 MED ORDER — SODIUM CHLORIDE 0.9 % IV BOLUS
1000.0000 mL | Freq: Once | INTRAVENOUS | Status: AC
  Administered 2020-01-26: 1000 mL via INTRAVENOUS

## 2020-01-26 MED ORDER — ONDANSETRON HCL 4 MG/2ML IJ SOLN
4.0000 mg | Freq: Once | INTRAMUSCULAR | Status: AC
  Administered 2020-01-26: 4 mg via INTRAVENOUS
  Filled 2020-01-26: qty 2

## 2020-01-26 MED ORDER — ALUM & MAG HYDROXIDE-SIMETH 200-200-20 MG/5ML PO SUSP
30.0000 mL | Freq: Once | ORAL | Status: DC
  Filled 2020-01-26: qty 30

## 2020-01-26 MED ORDER — METOCLOPRAMIDE HCL 5 MG/ML IJ SOLN
10.0000 mg | Freq: Once | INTRAMUSCULAR | Status: AC
  Administered 2020-01-26: 17:00:00 10 mg via INTRAVENOUS
  Filled 2020-01-26: qty 2

## 2020-01-26 MED ORDER — DICYCLOMINE HCL 10 MG/5ML PO SOLN
10.0000 mg | Freq: Once | ORAL | Status: DC
  Filled 2020-01-26: qty 5

## 2020-01-26 MED ORDER — LIDOCAINE VISCOUS HCL 2 % MT SOLN
15.0000 mL | Freq: Once | OROMUCOSAL | Status: DC
  Filled 2020-01-26: qty 15

## 2020-01-26 MED ORDER — MORPHINE SULFATE (PF) 2 MG/ML IV SOLN
2.0000 mg | Freq: Once | INTRAVENOUS | Status: AC
  Administered 2020-01-26: 2 mg via INTRAVENOUS
  Filled 2020-01-26: qty 1

## 2020-01-26 MED ORDER — SODIUM CHLORIDE 0.9% FLUSH
3.0000 mL | Freq: Once | INTRAVENOUS | Status: AC
  Administered 2020-01-26: 3 mL via INTRAVENOUS

## 2020-01-26 MED ORDER — METOCLOPRAMIDE HCL 10 MG PO TABS
10.0000 mg | ORAL_TABLET | Freq: Four times a day (QID) | ORAL | 0 refills | Status: DC
Start: 2020-01-26 — End: 2020-03-23

## 2020-01-26 NOTE — ED Notes (Addendum)
Pt unable to take maalox/lido and bentyl. Pt states she does not do well with oral medication.

## 2020-01-26 NOTE — ED Provider Notes (Addendum)
[facility] EMERGENCY DEPARTMENT Provider Note   CSN: [id number] Arrival date & time: 01/26/20  1121     History Chief Complaint  Patient presents with  . Abdominal Pain    Raven Webb is a 34 y.o. female.  HPI 34 year old female with a history of hypertension, palpitations, right upper quadrant pain presents to the ER with a 2-day history of nausea, several episodes of nonbloody nonbilous emesis, and right upper quadrant abdominal pain.  Patient stated that this began approximately 2 days ago, with a fairly sudden onset.  Per chart review, she has a history of right upper quadrant pain, she has been followed with GI, most recent right upper quadrant ultrasound done in February 2021, which was normal.  Follow-up HIDA scan showed low gallbladder ejection fraction of 23%.  Patient refers that GI was going to refer her to a general surgeon, but she has not heard from them.  She states that she has not been able to eat or drink much due to the nausea.  She also endorses right sided neck pain and shoulder pain.  She does not recall any injury or heavy lifting.  States this started around the time that her nausea and abdominal pain did.  She denies any fevers, chills, chest pain, shortness of breath, neck stiffness, dizziness, syncope, diarrhea, constipation, dysuria, hematuria, hematochezia,    Past Medical History:  Diagnosis Date  . GERD (gastroesophageal reflux disease)   . Hypertension   . IBS (irritable bowel syndrome)   . UTI (lower urinary tract infection)     Patient Active Problem List   Diagnosis Date Noted  . Abdominal pain 09/28/2019  . Urinary frequency 08/08/2019  . Prediabetes 08/08/2019  . Chest pain 08/08/2019  . Uncontrolled hypertension 03/31/2019  . Ear ache 03/09/2019  . Acute bronchitis 11/29/2018  . Closed fracture of bone of right foot 08/04/2018  . Obesity (BMI 30.0-34.9) 04/27/2018  . Viral illness 03/19/2018  . Migraine 05/06/2017  .  Knee pain, chronic 04/25/2016  . GERD (gastroesophageal reflux disease) 03/31/2013  . Healthcare maintenance 01/25/2013  . PALPITATIONS 04/30/2009  . HYPERTENSION, BENIGN ESSENTIAL 03/13/2008  . ASCUS PAP 07/14/2007    Past Surgical History:  Procedure Laterality Date  . WISDOM TOOTH EXTRACTION       OB History    Gravida  4   Para  3   Term  1   Preterm  2   AB      Living  3     SAB      TAB      Ectopic      Multiple      Live Births              Family History  Problem Relation Age of Onset  . Hypertension Mother   . Diabetes Mother   . Hypertension Father   . Hypertension Sister   . Diabetes Sister   . Hypertension Maternal Uncle   . Kidney disease Maternal Uncle     Social History   Tobacco Use  . Smoking status: Never Smoker  . Smokeless tobacco: Never Used  Substance Use Topics  . Alcohol use: No    Alcohol/week: 0.0 standard drinks  . Drug use: No    Home Medications Prior to Admission medications   Medication Sig Start Date End Date Taking? Authorizing Provider  cetirizine (ZYRTEC) 10 MG tablet Take 1 tablet (10 mg total) by mouth daily. 01/06/19   Isabelle Course,  MD  chlorthalidone (HYGROTON) 25 MG tablet Take 0.5 tablets (12.5 mg total) by mouth daily. Patient not taking: Reported on 12/07/2019 11/08/19   Mitzi Hansen, MD  dicyclomine (BENTYL) 10 MG capsule Take 1 capsule (10 mg total) by mouth 4 (four) times daily for 14 days. 10/21/19 11/04/19  Marianna Payment, MD  fluticasone (FLONASE) 50 MCG/ACT nasal spray Place 1 spray into both nostrils daily for 20 days. 01/05/20 01/25/20  Mitzi Hansen, MD  lisinopril (ZESTRIL) 20 MG tablet TAKE 1 TABLET BY MOUTH EVERY DAY 11/02/19   Mitzi Hansen, MD  metoCLOPramide (REGLAN) 10 MG tablet Take 1 tablet (10 mg total) by mouth every 6 (six) hours. 01/26/20   Garald Balding, PA-C  pantoprazole (PROTONIX) 40 MG tablet Take 1 tablet (40 mg total) by mouth daily. 11/08/19 12/08/19  Mitzi Hansen,  MD    Allergies    Patient has no known allergies.  Review of Systems   Review of Systems  Respiratory: Negative for shortness of breath.   Cardiovascular: Negative for chest pain.  Gastrointestinal: Positive for abdominal pain and nausea. Negative for abdominal distention, diarrhea and vomiting.  All other systems reviewed and are negative.   Physical Exam Updated Vital Signs BP (!) 158/97   Pulse 74   Temp 98.5 F (36.9 C) (Oral)   Resp 17   Ht '5\' 7"'  (1.702 m)   Wt 94.8 kg   SpO2 100%   BMI 32.73 kg/m   Physical Exam Constitutional:      General: She is not in acute distress.    Appearance: Normal appearance. She is obese. She is not ill-appearing.  HENT:     Head: Normocephalic and atraumatic.     Mouth/Throat:     Mouth: Mucous membranes are moist.     Pharynx: Oropharynx is clear.  Eyes:     Extraocular Movements: Extraocular movements intact.     Conjunctiva/sclera: Conjunctivae normal.     Pupils: Pupils are equal, round, and reactive to light.  Cardiovascular:     Rate and Rhythm: Normal rate and regular rhythm.     Pulses: Normal pulses.     Heart sounds: Normal heart sounds.  Pulmonary:     Effort: Pulmonary effort is normal.     Breath sounds: Normal breath sounds.  Abdominal:     General: Abdomen is flat. Bowel sounds are normal. There is no distension.     Palpations: Abdomen is soft. There is no mass.     Tenderness: There is abdominal tenderness in the right upper quadrant. There is no right CVA tenderness, left CVA tenderness, guarding or rebound. Negative signs include Murphy's sign and McBurney's sign.  Musculoskeletal:        General: Normal range of motion.     Cervical back: Normal range of motion. No tenderness.     Comments: Right-sided C-spine paraspinal tenderness in tenderness over trapezius muscle.  5/5 strength, range of motion and sensations intact in upper extremities.  No evidence of crepitus, step-offs, bruising, abscess, lesion,  erythema  Skin:    General: Skin is warm and dry.     Findings: No erythema or rash.  Neurological:     General: No focal deficit present.     Mental Status: She is alert and oriented to person, place, and time.  Psychiatric:        Mood and Affect: Mood normal.        Behavior: Behavior normal.     ED Results / Procedures / Treatments  Labs (all labs ordered are listed, but only abnormal results are displayed) Labs Reviewed  COMPREHENSIVE METABOLIC PANEL - Abnormal; Notable for the following components:      Result Value   Potassium 3.3 (*)    Alkaline Phosphatase 128 (*)    All other components within normal limits  CBC - Abnormal; Notable for the following components:   Platelets 471 (*)    All other components within normal limits  LIPASE, BLOOD  URINALYSIS, ROUTINE W REFLEX MICROSCOPIC  I-STAT BETA HCG BLOOD, ED ([facility], [facility], [facility] ONLY)    EKG None  Radiology No results found.  Procedures Procedures (including critical care time)  Medications Ordered in ED Medications  alum & mag hydroxide-simeth (MAALOX/MYLANTA) 200-200-20 MG/5ML suspension 30 mL (30 mLs Oral Refused 01/26/20 1636)    And  lidocaine (XYLOCAINE) 2 % viscous mouth solution 15 mL (15 mLs Oral Refused 01/26/20 1636)  dicyclomine (BENTYL) 10 MG/5ML solution 10 mg (10 mg Oral Refused 01/26/20 1637)  sodium chloride flush (NS) 0.9 % injection 3 mL (3 mLs Intravenous Given 01/26/20 1530)  ondansetron (ZOFRAN) injection 4 mg (4 mg Intravenous Given 01/26/20 1529)  sodium chloride 0.9 % bolus 1,000 mL (0 mLs Intravenous Stopped 01/26/20 1630)  morphine 2 MG/ML injection 2 mg (2 mg Intravenous Given 01/26/20 1635)  metoCLOPramide (REGLAN) injection 10 mg (10 mg Intravenous Given 01/26/20 1635)    ED Course  I have reviewed the triage vital signs and the nursing notes.  Pertinent labs & imaging results that were available during my care of the patient were reviewed by me and considered in my medical decision making (see  chart for details).    MDM Rules/Calculators/A&P                     34 year old with right upper quadrant pain x2 days along with right-sided neck and shoulder pain. On presentation to the ER, patient is alert and oriented, nontoxic-appearing, in no acute distress, speaking in  full sentences without increased work of breathing.  Patient is mildly hypertensive in the ER, BP downtrending throughout ED course, and seems to be consistent with her previous visits.  Other vitals reassuring.  Physical exam positive for right upper quadrant tenderness and right-sided paraspinal C-spine and trapezius tenderness to palpation.  CMP with mild hypokalemia likely consistent in the setting of vomitting, but with no other electrolyte abnormalities. No renal dysfunction, AST and ALT normal.  Alk phos mildly elevated.  Lipase normal, CBC without leukocytosis.  Patient treated with fluids, antiemetics and pain medication in the ER.  Given patient's history of right upper quadrant pain, and recent imaging and evaluation with the HIDA scan in April, along with recurrent right upper quadrant pain, I do not think a CT scan or any additional imaging is indicated at this time.  Her pain seems fairly consistent with her previous bouts of pain.  GI and general surgery contacted the patient while in the ED and have made a follow-up appointment for her to see general surgery for most likely removal of her gallbladder.  Right shoulder and neck pain seems consistent with musculoskeletal causes, doubt dissection at this time.  Upon serial abdominal reexamination, patient notes significant improvement after antiemetics and pain medication in the ER.  She wants to leave on her record.  I encouraged her to do a p.o. challenge which she tolerated well.  She refused the Bentyl and GI cocktail that I offered her stating that she cannot take liquid medications.  At this stage in the ED course, I do not think there are any causes of an acute  abdomen, her presentation seems consistent with her chronic right upper quadrant pain.  Patient has close follow-up with general surgery.  Return precautions given.  Patient voices understanding is agreeable to this plan.  Discharged with Reglan , encouraged over-the-counter anti-inflammatories for neck pain.  At this stage in the in the ED course, the patient is medically screened and is stable for discharge. Final Clinical Impression(s) / ED Diagnoses Final diagnoses:  Right upper quadrant abdominal pain  Nausea    Rx / DC Orders ED Discharge Orders         Ordered    metoCLOPramide (REGLAN) 10 MG tablet  Every 6 hours     01/26/20 1801               Garald Balding, PA-C 01/26/20 [place], [place], DO 01/26/20 2010

## 2020-01-26 NOTE — Discharge Instructions (Addendum)
Please follow-up with the general surgeon with whom you are referred to.  Take nausea medication as needed.  Return to the ER if your symptoms worsen.  Take over-the-counter Tylenol or ibuprofen for your neck and shoulder pain.

## 2020-01-26 NOTE — ED Notes (Signed)
Pt given 90ml of water. Pt tolerated well, no nausea or vomiting noted.

## 2020-01-26 NOTE — ED Triage Notes (Signed)
Pt complains of right shoulder pain/ right upper abd pain. Pt has hx of "gallbladder pain". Pt having diarrhea and nausea. Denies vomiting.

## 2020-02-03 ENCOUNTER — Other Ambulatory Visit: Payer: Self-pay | Admitting: General Surgery

## 2020-02-03 DIAGNOSIS — K811 Chronic cholecystitis: Secondary | ICD-10-CM | POA: Diagnosis not present

## 2020-02-03 DIAGNOSIS — K828 Other specified diseases of gallbladder: Secondary | ICD-10-CM | POA: Diagnosis not present

## 2020-02-06 IMAGING — DX DG FOOT COMPLETE 3+V*R*
3 series · 3 of 3 positions shown · non-contrast
Comparison: None.

CLINICAL DATA: Patient with pain in the little toe after injury to
the door jam. Initial encounter.

EXAM:
RIGHT FOOT COMPLETE - 3+ VIEW

[foot ap]
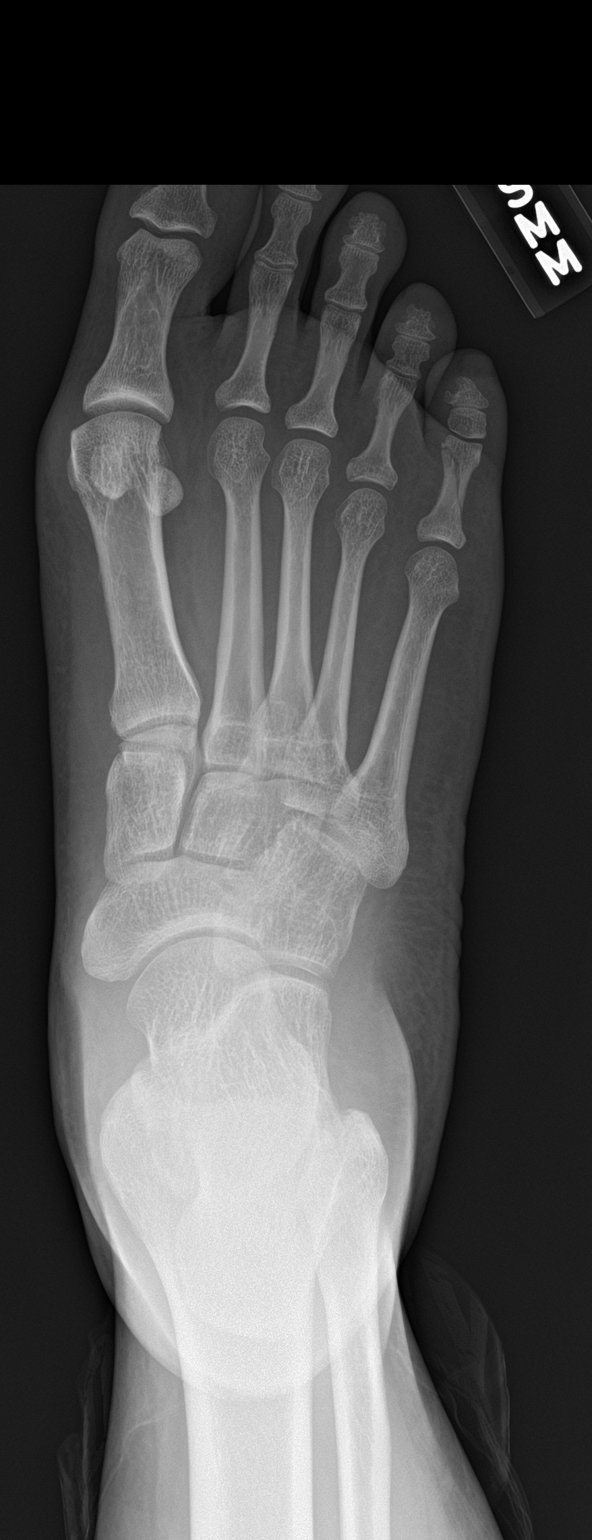

[foot obl]
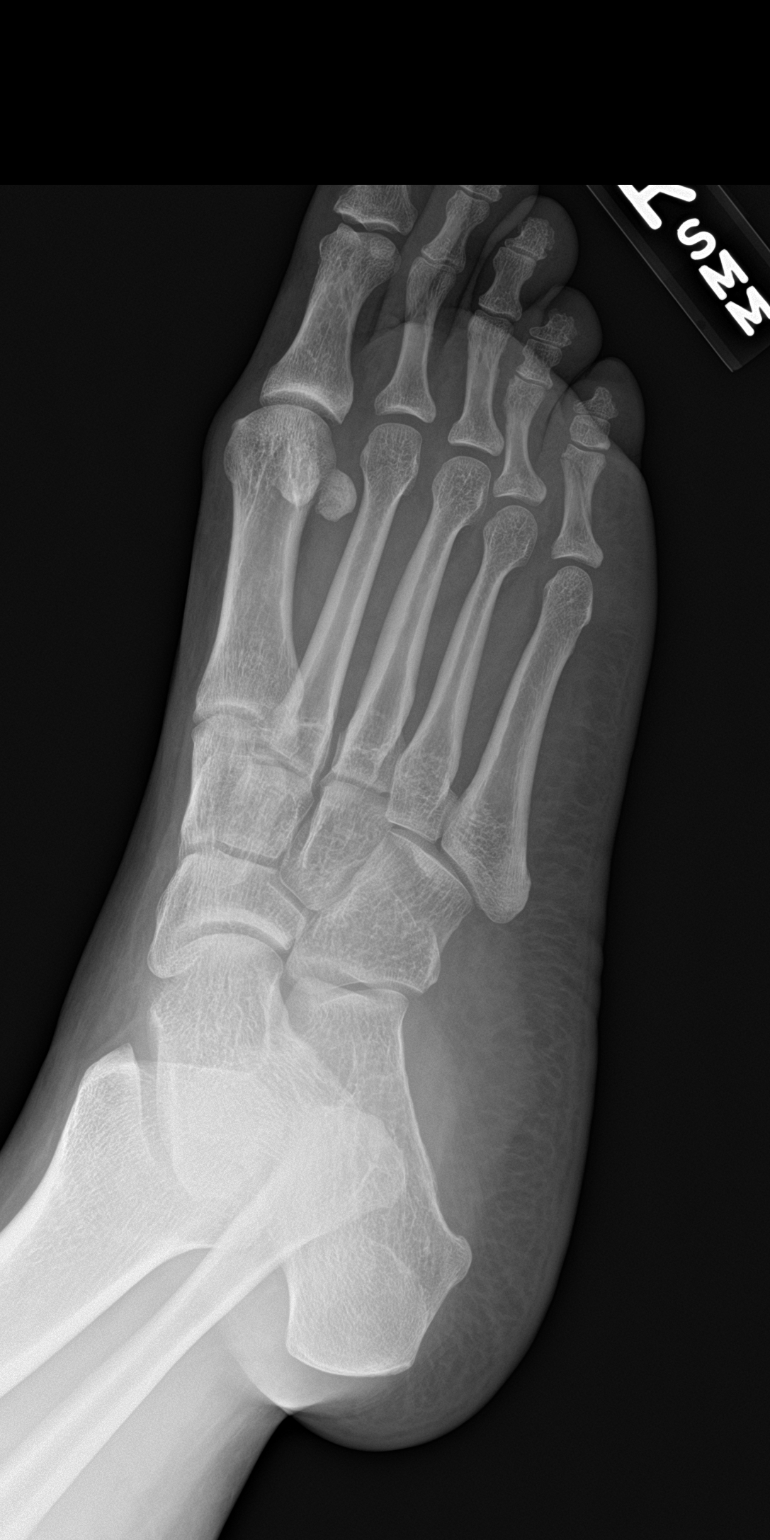

[foot lat]
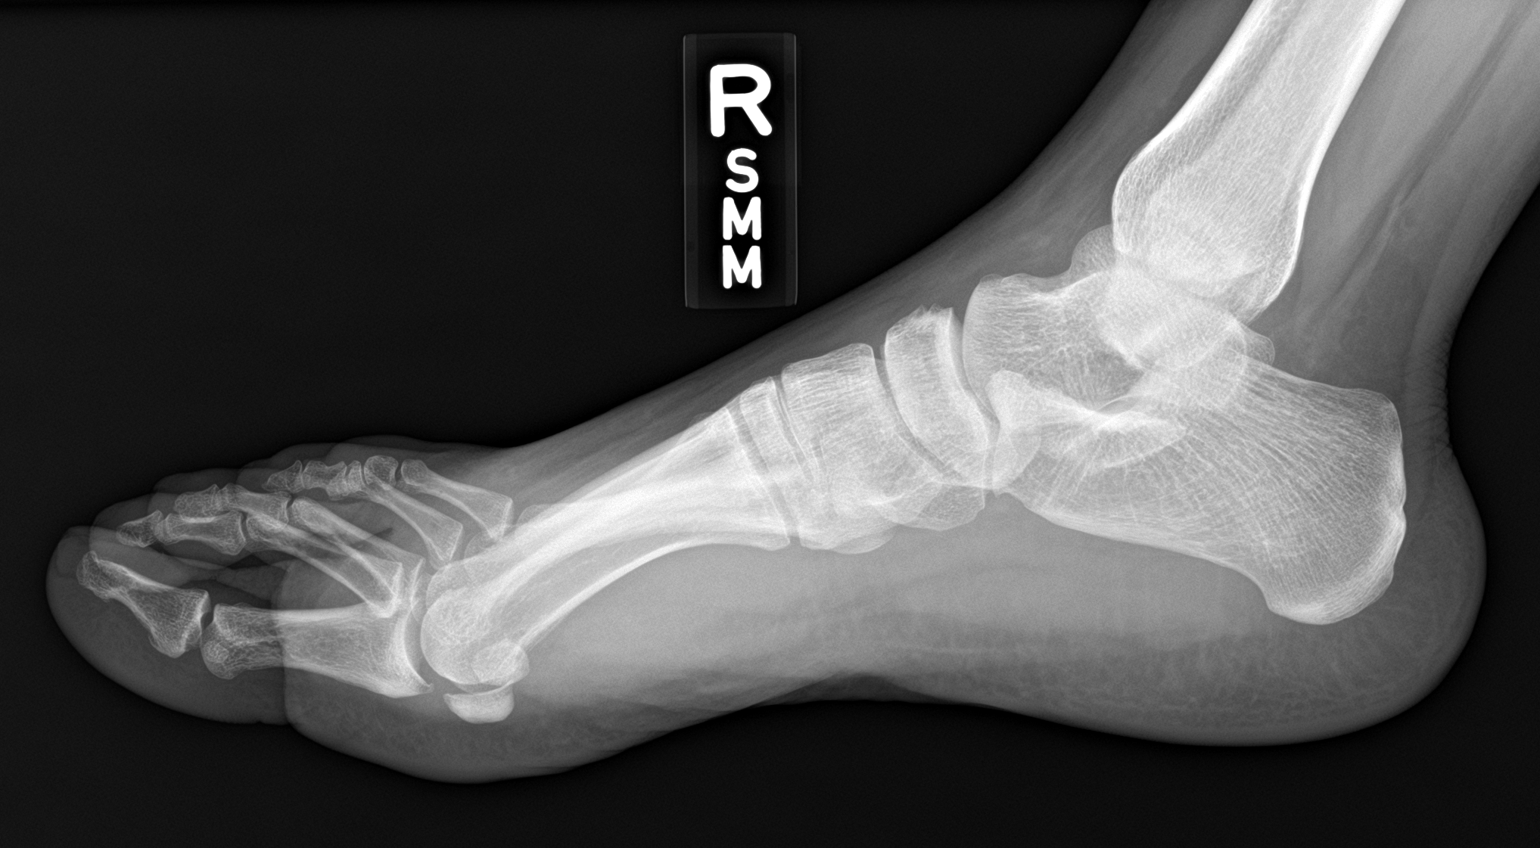

[3 of 3 positions shown; findings below may reference images not displayed]

FINDINGS: Nondisplaced oblique fracture through the proximal phalanx of the
digit. Overlying soft tissue swelling. First MTP joint degenerative
changes. No evidence for associated acute fractures.
IMPRESSION: Nondisplaced oblique fracture through the proximal phalanx of the
fifth digit.

## 2020-02-22 ENCOUNTER — Ambulatory Visit (INDEPENDENT_AMBULATORY_CARE_PROVIDER_SITE_OTHER): Payer: Medicaid Other | Admitting: *Deleted

## 2020-02-22 ENCOUNTER — Other Ambulatory Visit: Payer: Self-pay

## 2020-02-22 VITALS — BP 141/105

## 2020-02-22 DIAGNOSIS — Z3042 Encounter for surveillance of injectable contraceptive: Secondary | ICD-10-CM | POA: Diagnosis not present

## 2020-02-22 MED ORDER — MEDROXYPROGESTERONE ACETATE 150 MG/ML IM SUSP
150.0000 mg | Freq: Once | INTRAMUSCULAR | Status: AC
Start: 2020-02-22 — End: 2020-02-22
  Administered 2020-02-22: 150 mg via INTRAMUSCULAR

## 2020-02-22 NOTE — Progress Notes (Signed)
Chart reviewed for nurse visit. Agree with plan of care.   Marny Lowenstein, PA-C 02/22/2020 12:14 PM

## 2020-02-22 NOTE — Progress Notes (Signed)
Here for depoprovera. Has medicaid . Per chart has not had annual exam since 04/2018. Informed her medicaid may not cover her depo-provera today because she has not seen provider within one year. Discussed with registar no open appointments today.  Informed Raven Webb I can give the depo-provera but she may be billed. She prefers to go ahead and get injection. Explained at Copy will schedule annual and she cannot get depo-provera again until has had annual. Exam. She voices understanding. Coran Dipaola,RN

## 2020-02-25 ENCOUNTER — Other Ambulatory Visit: Payer: Self-pay | Admitting: Internal Medicine

## 2020-02-25 DIAGNOSIS — R059 Cough, unspecified: Secondary | ICD-10-CM

## 2020-03-01 IMAGING — DX DG FOOT COMPLETE 3+V*R*
3 series · 3 of 3 positions shown · non-contrast
Comparison: Radiographs July 31, 2018.

CLINICAL DATA: Closed fracture of right foot with routine healing.

EXAM:
RIGHT FOOT COMPLETE - 3+ VIEW

[foot ap]
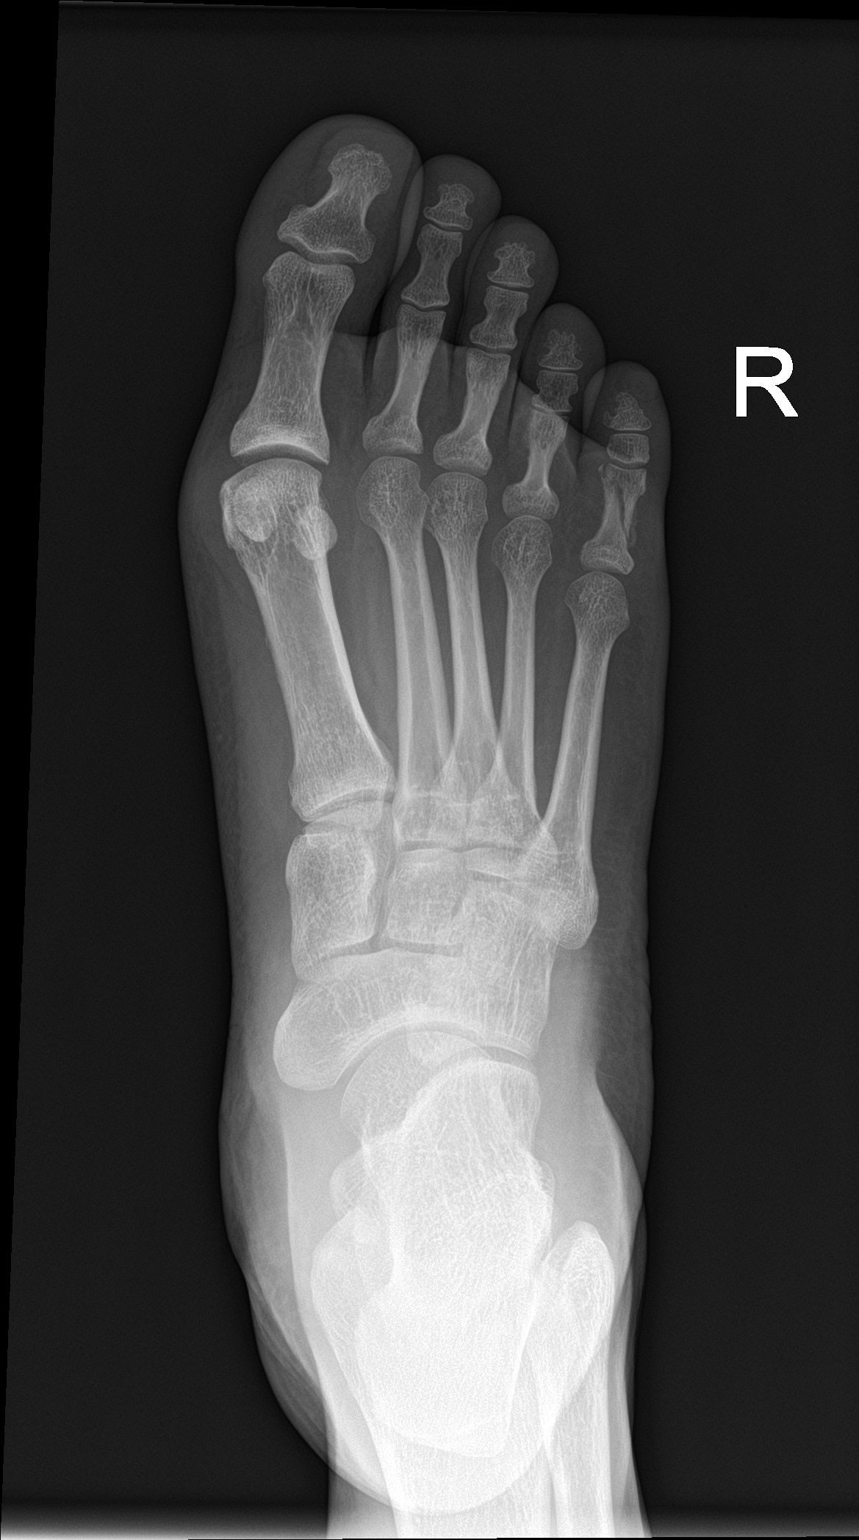

[foot obl]
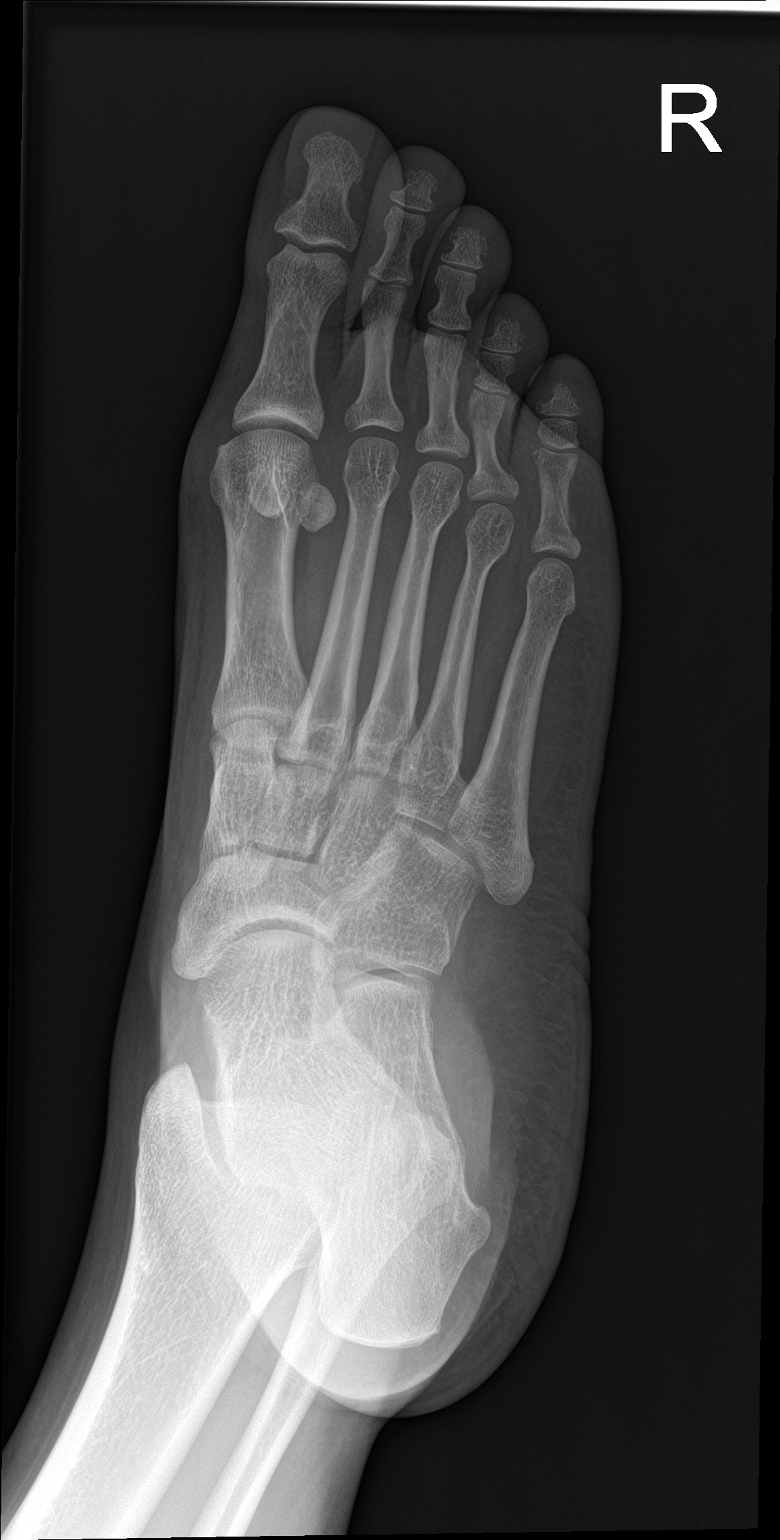

[foot lat]
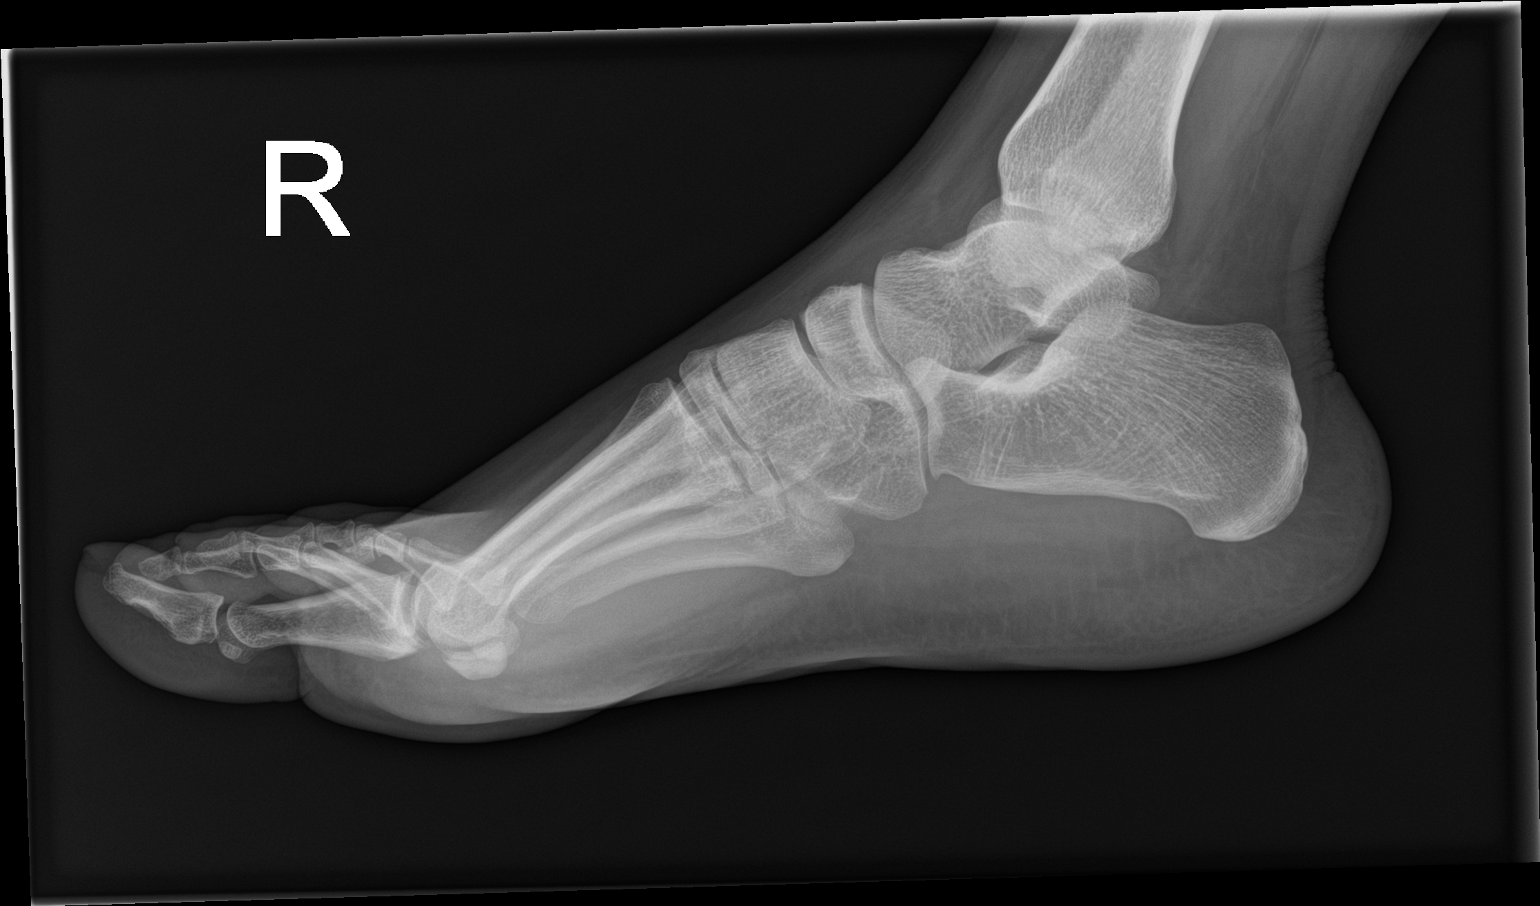

[3 of 3 positions shown; findings below may reference images not displayed]

FINDINGS: Mildly displaced oblique fracture in the fifth proximal phalanx is
again noted. Some callus formation is noted suggesting healing,
although persistent fracture line remains. No other bony abnormality
is noted. Joint spaces are intact. No soft tissue abnormality is
noted.
IMPRESSION: Mildly displaced fifth proximal phalangeal fracture is noted with
some callus formation present, but persistent fracture line remains.

## 2020-03-21 ENCOUNTER — Ambulatory Visit (INDEPENDENT_AMBULATORY_CARE_PROVIDER_SITE_OTHER): Payer: Medicaid Other | Admitting: Family Medicine

## 2020-03-21 ENCOUNTER — Encounter: Payer: Self-pay | Admitting: Family Medicine

## 2020-03-21 ENCOUNTER — Encounter: Payer: Self-pay | Admitting: Internal Medicine

## 2020-03-21 ENCOUNTER — Other Ambulatory Visit: Payer: Self-pay

## 2020-03-21 VITALS — BP 162/127 | HR 97 | Ht 70.0 in | Wt 210.0 lb

## 2020-03-21 DIAGNOSIS — Z01419 Encounter for gynecological examination (general) (routine) without abnormal findings: Secondary | ICD-10-CM

## 2020-03-21 DIAGNOSIS — Z0000 Encounter for general adult medical examination without abnormal findings: Secondary | ICD-10-CM

## 2020-03-21 NOTE — Progress Notes (Signed)
GYNECOLOGY ANNUAL PREVENTATIVE CARE ENCOUNTER NOTE  Subjective:   Raven Webb is a 34 y.o. [phone] female here for a routine annual gynecologic exam.  Current complaints: none.   Denies abnormal vaginal bleeding, discharge, pelvic pain, problems with intercourse or other gynecologic concerns.    Gynecologic History No LMP recorded. Patient has had an injection. Patient is sexually active  Contraception: Depo-Provera injections Last Pap: 2019. Results were: normal Last mammogram: n/a.  Obstetric History OB History  Gravida Para Term Preterm AB Living  4 3 1 2   3   SAB TAB Ectopic Multiple Live Births               # Outcome Date GA Lbr Len/2nd Weight Sex Delivery Anes PTL Lv  4 Gravida           3 Term           2 Preterm           1 Preterm             Past Medical History:  Diagnosis Date  . GERD (gastroesophageal reflux disease)   . Hypertension   . IBS (irritable bowel syndrome)   . UTI (lower urinary tract infection)     Past Surgical History:  Procedure Laterality Date  . WISDOM TOOTH EXTRACTION      Current Outpatient Medications on File Prior to Visit  Medication Sig Dispense Refill  . cetirizine (ZYRTEC) 10 MG tablet Take 1 tablet (10 mg total) by mouth daily. 90 tablet 3  . lisinopril (ZESTRIL) 20 MG tablet TAKE 1 TABLET BY MOUTH EVERY DAY 90 tablet 1  . lisinopril (ZESTRIL) 20 MG tablet Take 20 mg by mouth daily.    . metoCLOPramide (REGLAN) 10 MG tablet Take 1 tablet (10 mg total) by mouth every 6 (six) hours. 30 tablet 0  . chlorthalidone (HYGROTON) 25 MG tablet Take 0.5 tablets (12.5 mg total) by mouth daily. (Patient not taking: Reported on 03/21/2020) 30 tablet 2  . dicyclomine (BENTYL) 10 MG capsule Take 1 capsule (10 mg total) by mouth 4 (four) times daily for 14 days. 56 capsule 0  . fluticasone (FLONASE) 50 MCG/ACT nasal spray PLACE 1 SPRAY INTO BOTH NOSTRILS DAILY FOR 20 DAYS. 16 g 1  . pantoprazole (PROTONIX) 40 MG tablet Take 1 tablet (40  mg total) by mouth daily. 30 tablet 1   No current facility-administered medications on file prior to visit.    No Known Allergies  Social History   Socioeconomic History  . Marital status: Single    Spouse name: Not on file  . Number of children: Not on file  . Years of education: Not on file  . Highest education level: Not on file  Occupational History  . Not on file  Tobacco Use  . Smoking status: Never Smoker  . Smokeless tobacco: Never Used  Vaping Use  . Vaping Use: Never used  Substance and Sexual Activity  . Alcohol use: No    Alcohol/week: 0.0 standard drinks  . Drug use: No  . Sexual activity: Not on file  Other Topics Concern  . Not on file  Social History Narrative  . Not on file   Social Determinants of Health   Financial Resource Strain:   . Difficulty of Paying Living Expenses:   Food Insecurity: No Food Insecurity  . Worried About 03/23/2020 in the Last Year: Never true  . Ran Out of Food in the Last  Year: Never true  Transportation Needs: No Transportation Needs  . Lack of Transportation (Medical): No  . Lack of Transportation (Non-Medical): No  Physical Activity:   . Days of Exercise per Week:   . Minutes of Exercise per Session:   Stress:   . Feeling of Stress :   Social Connections:   . Frequency of Communication with Friends and Family:   . Frequency of Social Gatherings with Friends and Family:   . Attends Religious Services:   . Active Member of Clubs or Organizations:   . Attends Banker Meetings:   Marland Kitchen Marital Status:   Intimate Partner Violence:   . Fear of Current or Ex-Partner:   . Emotionally Abused:   Marland Kitchen Physically Abused:   . Sexually Abused:     Family History  Problem Relation Age of Onset  . Hypertension Mother   . Diabetes Mother   . Hypertension Father   . Hypertension Sister   . Diabetes Sister   . Hypertension Maternal Uncle   . Kidney disease Maternal Uncle     The following portions of  the patient's history were reviewed and updated as appropriate: allergies, current medications, past family history, past medical history, past social history, past surgical history and problem list.  Review of Systems Pertinent items are noted in HPI.   Objective:  BP (!) 162/127   Pulse 97   Ht 5\' 10"  (1.778 m)   Wt 210 lb (95.3 kg)   BMI 30.13 kg/m  Wt Readings from Last 3 Encounters:  03/21/20 210 lb (95.3 kg)  01/26/20 209 lb (94.8 kg)  11/08/19 214 lb 6.4 oz (97.3 kg)     Chaperone present during exam  CONSTITUTIONAL: Well-developed, well-nourished female in no acute distress.  HENT:  Normocephalic, atraumatic, External right and left ear normal. Oropharynx is clear and moist EYES: Conjunctivae and EOM are normal. Pupils are equal, round, and reactive to light. No scleral icterus.  NECK: Normal range of motion, supple, no masses.  Normal thyroid.   CARDIOVASCULAR: Normal heart rate noted, regular rhythm RESPIRATORY: Clear to auscultation bilaterally. Effort and breath sounds normal, no problems with respiration noted. BREASTS: Symmetric in size. No masses, skin changes, nipple drainage, or lymphadenopathy. ABDOMEN: Soft, normal bowel sounds, no distention noted.  No tenderness, rebound or guarding.  PELVIC: Normal appearing external genitalia; normal appearing vaginal mucosa and cervix.  No abnormal discharge noted.  Normal uterine size, no other palpable masses, no uterine or adnexal tenderness. MUSCULOSKELETAL: Normal range of motion. No tenderness.  No cyanosis, clubbing, or edema.  2+ distal pulses. SKIN: Skin is warm and dry. No rash noted. Not diaphoretic. No erythema. No pallor. NEUROLOGIC: Alert and oriented to person, place, and time. Normal reflexes, muscle tone coordination. No cranial nerve deficit noted. PSYCHIATRIC: Normal mood and affect. Normal behavior. Normal judgment and thought content.  Assessment:  Annual gynecologic examination with pap smear   Plan:   1. Well Woman Exam Will follow up results of pap smear and manage accordingly. STD testing discussed. Patient declined testing  Routine preventative health maintenance measures emphasized. Please refer to After Visit Summary for other counseling recommendations.    11/10/19, DO [facility]

## 2020-03-23 ENCOUNTER — Encounter: Payer: Self-pay | Admitting: Internal Medicine

## 2020-03-23 ENCOUNTER — Ambulatory Visit: Payer: Medicaid Other | Admitting: Internal Medicine

## 2020-03-23 DIAGNOSIS — I10 Essential (primary) hypertension: Secondary | ICD-10-CM | POA: Diagnosis not present

## 2020-03-23 MED ORDER — METOCLOPRAMIDE HCL 10 MG PO TABS: 10.0000 mg | ORAL_TABLET | Freq: Four times a day (QID) | ORAL | 0 refills | Status: DC

## 2020-03-23 MED ORDER — LISINOPRIL 40 MG PO TABS: 40.0000 mg | ORAL_TABLET | Freq: Every day | ORAL | 1 refills | Status: DC

## 2020-03-23 NOTE — Progress Notes (Signed)
Acute Office Visit  Subjective:    Patient ID: Raven Webb, female    DOB: [birth year], 34 y.o.   MRN: [id number]  Chief Complaint  Patient presents with  . Blood Pressure Check    HPI Patient is in today for follow-up on HTN. Please see problem based charting for further details.   Past Medical History:  Diagnosis Date  . GERD (gastroesophageal reflux disease)   . Hypertension   . IBS (irritable bowel syndrome)   . UTI (lower urinary tract infection)     Past Surgical History:  Procedure Laterality Date  . WISDOM TOOTH EXTRACTION      Family History  Problem Relation Age of Onset  . Hypertension Mother   . Diabetes Mother   . Hypertension Father   . Hypertension Sister   . Diabetes Sister   . Hypertension Maternal Uncle   . Kidney disease Maternal Uncle     Social History   Socioeconomic History  . Marital status: Single    Spouse name: Not on file  . Number of children: Not on file  . Years of education: Not on file  . Highest education level: Not on file  Occupational History  . Not on file  Tobacco Use  . Smoking status: Never Smoker  . Smokeless tobacco: Never Used  Vaping Use  . Vaping Use: Never used  Substance and Sexual Activity  . Alcohol use: No    Alcohol/week: 0.0 standard drinks  . Drug use: No  . Sexual activity: Not on file  Other Topics Concern  . Not on file  Social History Narrative  . Not on file   Social Determinants of Health   Financial Resource Strain:   . Difficulty of Paying Living Expenses:   Food Insecurity: No Food Insecurity  . Worried About Programme researcher, broadcasting/film/video in the Last Year: Never true  . Ran Out of Food in the Last Year: Never true  Transportation Needs: No Transportation Needs  . Lack of Transportation (Medical): No  . Lack of Transportation (Non-Medical): No  Physical Activity:   . Days of Exercise per Week:   . Minutes of Exercise per Session:   Stress:   . Feeling of Stress :   Social Connections:    . Frequency of Communication with Friends and Family:   . Frequency of Social Gatherings with Friends and Family:   . Attends Religious Services:   . Active Member of Clubs or Organizations:   . Attends Banker Meetings:   Marland Kitchen Marital Status:   Intimate Partner Violence:   . Fear of Current or Ex-Partner:   . Emotionally Abused:   Marland Kitchen Physically Abused:   . Sexually Abused:     Outpatient Medications Prior to Visit  Medication Sig Dispense Refill  . cetirizine (ZYRTEC) 10 MG tablet Take 1 tablet (10 mg total) by mouth daily. 90 tablet 3  . dicyclomine (BENTYL) 10 MG capsule Take 1 capsule (10 mg total) by mouth 4 (four) times daily for 14 days. 56 capsule 0  . fluticasone (FLONASE) 50 MCG/ACT nasal spray PLACE 1 SPRAY INTO BOTH NOSTRILS DAILY FOR 20 DAYS. 16 g 1  . pantoprazole (PROTONIX) 40 MG tablet Take 1 tablet (40 mg total) by mouth daily. 30 tablet 1  . chlorthalidone (HYGROTON) 25 MG tablet Take 0.5 tablets (12.5 mg total) by mouth daily. (Patient not taking: Reported on 03/21/2020) 30 tablet 2  . lisinopril (ZESTRIL) 20 MG tablet TAKE 1 TABLET BY  MOUTH EVERY DAY 90 tablet 1  . lisinopril (ZESTRIL) 20 MG tablet Take 20 mg by mouth daily.    . metoCLOPramide (REGLAN) 10 MG tablet Take 1 tablet (10 mg total) by mouth every 6 (six) hours. 30 tablet 0   No facility-administered medications prior to visit.    No Known Allergies  Review of Systems  Eyes: Negative for visual disturbance.  Respiratory: Negative for shortness of breath.   Cardiovascular: Negative for chest pain, palpitations and leg swelling.  Gastrointestinal: Negative for abdominal pain and nausea.  Neurological: Positive for headaches.       Objective:    Physical Exam Constitutional:      General: She is not in acute distress.    Appearance: Normal appearance.  Cardiovascular:     Rate and Rhythm: Normal rate and regular rhythm.  Pulmonary:     Effort: Pulmonary effort is normal.     Breath  sounds: Normal breath sounds.  Musculoskeletal:     Right lower leg: No edema.     Left lower leg: No edema.  Neurological:     Mental Status: She is alert.     BP (!) 148/105 (Patient Position: Sitting)   Pulse 74   Ht 5\' 7"  (1.702 m)   Wt 212 lb 1.6 oz (96.2 kg)   SpO2 100%   BMI 33.22 kg/m  Wt Readings from Last 3 Encounters:  03/23/20 212 lb 1.6 oz (96.2 kg)  03/21/20 210 lb (95.3 kg)  01/26/20 209 lb (94.8 kg)    Health Maintenance Due  Topic Date Due  . COVID-19 Vaccine (1) Never done  . TETANUS/TDAP  09/22/2017    There are no preventive care reminders to display for this patient.   Lab Results  Component Value Date   TSH 1.430 10/02/2016   Lab Results  Component Value Date   WBC 5.9 01/26/2020   HGB 13.7 01/26/2020   HCT 41.6 01/26/2020   MCV 87.6 01/26/2020   PLT 471 (H) 01/26/2020   Lab Results  Component Value Date   NA 137 01/26/2020   K 3.3 (L) 01/26/2020   CO2 25 01/26/2020   GLUCOSE 98 01/26/2020   BUN 7 01/26/2020   CREATININE 0.97 01/26/2020   BILITOT 0.6 01/26/2020   ALKPHOS 128 (H) 01/26/2020   AST 15 01/26/2020   ALT 12 01/26/2020   PROT 7.8 01/26/2020   ALBUMIN 4.0 01/26/2020   CALCIUM 9.5 01/26/2020   ANIONGAP 9 01/26/2020   No results found for: CHOL No results found for: HDL No results found for: LDLCALC No results found for: TRIG No results found for: CHOLHDL Lab Results  Component Value Date   HGBA1C 5.7 (A) 08/08/2019       Assessment & Plan:   Problem List Items Addressed This Visit      Cardiovascular and Mediastinum   HYPERTENSION, BENIGN ESSENTIAL    Patient presents for follow-up on HTN. Her blood pressure is elevated to 148/105 which is consistent with her readings when she checks it at [facility]. She is overall fairly asymptomatic apart from occasional headaches.   She had previously been prescribed Lisinopril and HCTZ, but has only been taking Lisinopril due to side effects of light headedness on HCTZ. Also  reports intolerance to Amlodipine in the past.  Will try increasing Lisinopril to full dose of 40 mg. Return in 4 weeks for BP re-check and BMP.       Relevant Medications   lisinopril (ZESTRIL) 40 MG tablet  Meds ordered this encounter  Medications  . metoCLOPramide (REGLAN) 10 MG tablet    Sig: Take 1 tablet (10 mg total) by mouth every 6 (six) hours.    Dispense:  30 tablet    Refill:  0  . lisinopril (ZESTRIL) 40 MG tablet    Sig: Take 1 tablet (40 mg total) by mouth daily.    Dispense:  30 tablet    Refill:  1     Sumire Halbleib D Mitchelle Sultan, DO

## 2020-03-23 NOTE — Assessment & Plan Note (Signed)
Patient presents for follow-up on HTN. Her blood pressure is elevated to 148/105 which is consistent with her readings when she checks it at [facility]. She is overall fairly asymptomatic apart from occasional headaches.   She had previously been prescribed Lisinopril and HCTZ, but has only been taking Lisinopril due to side effects of light headedness on HCTZ. Also reports intolerance to Amlodipine in the past.  Will try increasing Lisinopril to full dose of 40 mg. Return in 4 weeks for BP re-check and BMP.

## 2020-03-23 NOTE — Patient Instructions (Signed)
Raven Webb, It was nice meeting you! Let's see how you do maximizing the dose of your Lisinopril before we add another medication. I have sent in a new prescription for Lisinopril 40 mg daily, and we'll see you back in 4 weeks to repeat labs and re-check your blood pressure.   Take care, Dr. Chesley Mires

## 2020-04-02 NOTE — Progress Notes (Addendum)
Internal Medicine Clinic Attending  Case discussed with Dr. Bloomfield  At the time of the visit.  We reviewed the resident's history and exam and pertinent patient test results.  I agree with the assessment, diagnosis, and plan of care documented in the resident's note.  

## 2020-04-17 ENCOUNTER — Encounter: Payer: Self-pay | Admitting: Internal Medicine

## 2020-04-17 ENCOUNTER — Other Ambulatory Visit: Payer: Self-pay

## 2020-04-17 ENCOUNTER — Ambulatory Visit: Payer: Medicaid Other | Admitting: Internal Medicine

## 2020-04-17 VITALS — BP 147/88 | HR 82 | Temp 98.1°F | Ht 67.0 in | Wt 214.2 lb

## 2020-04-17 DIAGNOSIS — I10 Essential (primary) hypertension: Secondary | ICD-10-CM

## 2020-04-17 MED ORDER — AMLODIPINE BESYLATE 5 MG PO TABS
5.0000 mg | ORAL_TABLET | Freq: Every day | ORAL | 1 refills | Status: DC
Start: 2020-04-17 — End: 2020-05-09

## 2020-04-17 NOTE — Patient Instructions (Signed)
[name], It was nice seeing you again.   Today we discussed your blood pressure. Thank you for bringing the log in, that is super helpful! Your readings are not quite down to where we would like. I'm going to prescribe low dose amlodipine to make sure you tolerate it ok and see how you blood pressure does. We'll bring you back in another month or so to see how you're doing.   Take care, Dr. Chesley Mires

## 2020-04-18 ENCOUNTER — Encounter: Payer: Self-pay | Admitting: Internal Medicine

## 2020-04-18 LAB — BMP8+ANION GAP
Anion Gap: 15 mmol/L (ref 10.0–18.0)
BUN/Creatinine Ratio: 9 (ref 9–23)
BUN: 9 mg/dL (ref 6–20)
CO2: 22 mmol/L (ref 20–29)
Calcium: 9.5 mg/dL (ref 8.7–10.2)
Chloride: 104 mmol/L (ref 96–106)
Creatinine, Ser: 1 mg/dL (ref 0.57–1.00)
GFR calc Af Amer: 86 mL/min/{1.73_m2} (ref >59)
GFR calc non Af Amer: 75 mL/min/{1.73_m2} (ref >59)
Glucose: 96 mg/dL (ref 65–99)
Potassium: 3.8 mmol/L (ref 3.5–5.2)
Sodium: 141 mmol/L (ref 134–144)

## 2020-04-20 ENCOUNTER — Encounter: Payer: Medicaid Other | Admitting: Internal Medicine

## 2020-04-26 ENCOUNTER — Other Ambulatory Visit: Payer: Self-pay

## 2020-04-26 ENCOUNTER — Ambulatory Visit (INDEPENDENT_AMBULATORY_CARE_PROVIDER_SITE_OTHER): Payer: Medicaid Other | Admitting: Internal Medicine

## 2020-04-26 ENCOUNTER — Encounter: Payer: Self-pay | Admitting: Internal Medicine

## 2020-04-26 DIAGNOSIS — H0102A Squamous blepharitis right eye, upper and lower eyelids: Secondary | ICD-10-CM | POA: Diagnosis not present

## 2020-04-26 MED ORDER — ERYTHROMYCIN 5 MG/GM OP OINT: 1.0000 "application " | TOPICAL_OINTMENT | Freq: Every day | OPHTHALMIC | 0 refills | Status: DC

## 2020-04-26 NOTE — Patient Instructions (Addendum)
[name],  It was a pleasure to see you today. Thank you for coming in.   Today we discussed your eye pain. This looks like an infection of your eye lid glands. Please start using warm compresses on the area. Please start using erythromycin ointment 1-2 times daily for the next week. Please keep your eyelids clean, you can use baby shampoo around the area.   Please return to clinic in 1-2 months or sooner if needed.   Thank you again for coming in.   Raven Webb M.D.  Blepharitis Blepharitis is swelling of the eyelids. Symptoms may include:  Reddish, scaly skin around the scalp and eyebrows.  Burning or itching of the eyelids.  Fluid coming from the eye at night. This causes the eyelashes to stick together in the morning.  Eyelashes that fall out.  Being sensitive to light. Follow these instructions at home: Pay attention to any changes in how you look or feel. Tell your health care provider about any changes. Follow these instructions to help with your condition: Keeping clean   Wash your hands often.  Wash your eyelids with warm water, or wash them with warm water that is mixed with little bit of baby shampoo. Do this 2 or more times per day.  Wash your face and eyebrows at least once a day.  Use a clean towel each time you dry your eyelids. Do not use the towel to clean or dry other areas of your body. Do not share your towel with anyone. General instructions  Avoid wearing makeup until you get better. Do not share makeup with anyone.  Avoid rubbing your eyes.  Put a warm compress on your eyes 2 times per day for 10 minutes at a time, or as told by your doctor.  If you were given antibiotics in the form of creams or eye drops, use the medicine as told by your doctor. Do not stop using the medicine even if you feel better.  Keep all follow-up visits as told by your doctor. This is important. Contact a doctor if:  Your eyelids feel hot.  You have  blisters on your eyelids.  You have a rash on your eyelids.  The swelling does not go away in 2-4 days.  The swelling gets worse. Get help right away if:  You have pain that gets worse.  You have pain that spreads to other parts of your face.  You have redness that gets worse.  You have redness that spreads to other parts of your face.  Your vision changes.  You have pain when you look at lights or things that move.  You have a fever. Summary  Blepharitis is swelling of the eyelids.  Pay attention to any changes in how your eyes look or feel. Tell your doctor about any changes.  Follow home care instructions as told by your doctor. Wash your hands often. Avoid wearing makeup. Do not rub your eyes.  Use warm compresses, creams, or eye drops as told by your doctor.  Let your doctor know if you have changes in vision, blisters or rash on eyelids, pain that spreads to your face, or warmth on your eyelids. This information is not intended to replace advice given to you by your health care provider. Make sure you discuss any questions you have with your health care provider. Document Revised: 03/08/2018 Document Reviewed: 03/08/2018 Elsevier Patient Education  2020 [employer].

## 2020-04-26 NOTE — Assessment & Plan Note (Signed)
Patient presents for 1 month follow-up after increasing Lisinopril to full dose. She brought her blood pressure log which shows improved readings consistent with today's in clinic. She is tolerating the medication well without side effects.  Since she is not quite at goal for an otherwise young, healthy female will add low dose amlodipine and re-check in 6 weeks.  BMP today shows stable renal function and electrolytes.

## 2020-04-26 NOTE — Progress Notes (Signed)
° °  CC: Right eye pain and swelling  HPI:  [name] is a 34 y.o. with the history listed below presenting with right eye pain and swelling.   Past Medical History:  Diagnosis Date   GERD (gastroesophageal reflux disease)    Hypertension    IBS (irritable bowel syndrome)    UTI (lower urinary tract infection)    Review of Systems:   Constitutional: Negative for chills and fever.  HEENT: Positive for right eye pain and swelling. Respiratory: Negative for shortness of breath.   Cardiovascular: Negative for chest pain and leg swelling.  Gastrointestinal: Negative for abdominal pain, nausea and vomiting.  Neurological: Negative for dizziness and headaches.   Physical Exam:  Vitals:   04/26/20 1518  BP: (!) 148/93  Pulse: 78  Temp: 98.5 F (36.9 C)  TempSrc: Oral  SpO2: 100%  Weight: 214 lb 12.8 oz (97.4 kg)  Height: 5\' 7"  (1.702 m)   Physical Exam Constitutional:      Appearance: Normal appearance.  Eyes:     General:        Right eye: Discharge (Mild conjuncival erythema, no pain with EOMI, mild swelling of upper eyelid(see picture)) present.  Cardiovascular:     Rate and Rhythm: Normal rate and regular rhythm.     Pulses: Normal pulses.     Heart sounds: Normal heart sounds.  Pulmonary:     Effort: Pulmonary effort is normal.     Breath sounds: Normal breath sounds.  Abdominal:     General: Abdomen is flat. Bowel sounds are normal.     Palpations: Abdomen is soft.  Skin:    General: Skin is warm and dry.     Capillary Refill: Capillary refill takes less than 2 seconds.  Neurological:     General: No focal deficit present.     Mental Status: She is alert and oriented to person, place, and time.  Psychiatric:        Mood and Affect: Mood normal.        Behavior: Behavior normal.          Assessment & Plan:   See Encounters Tab for problem based charting.  Patient discussed with Dr. 

## 2020-04-26 NOTE — Progress Notes (Signed)
Acute Office Visit  Subjective:    Patient ID: Raven Webb, female    DOB: 1985-10-07, 34 y.o.   MRN: [id number]  Chief Complaint  Patient presents with  . Follow-up    4 week for BP    HPI Patient is in today for follow-up on HTN after medication adjustment. Please see problem based charting for further details.   Past Medical History:  Diagnosis Date  . GERD (gastroesophageal reflux disease)   . Hypertension   . IBS (irritable bowel syndrome)   . UTI (lower urinary tract infection)     Past Surgical History:  Procedure Laterality Date  . WISDOM TOOTH EXTRACTION      Family History  Problem Relation Age of Onset  . Hypertension Mother   . Diabetes Mother   . Hypertension Father   . Hypertension Sister   . Diabetes Sister   . Hypertension Maternal Uncle   . Kidney disease Maternal Uncle     Social History   Socioeconomic History  . Marital status: Single    Spouse name: Not on file  . Number of children: Not on file  . Years of education: Not on file  . Highest education level: Not on file  Occupational History  . Not on file  Tobacco Use  . Smoking status: Never Smoker  . Smokeless tobacco: Never Used  Vaping Use  . Vaping Use: Never used  Substance and Sexual Activity  . Alcohol use: No    Alcohol/week: 0.0 standard drinks  . Drug use: No  . Sexual activity: Not on file  Other Topics Concern  . Not on file  Social History Narrative  . Not on file   Social Determinants of Health   Financial Resource Strain:   . Difficulty of Paying Living Expenses:   Food Insecurity: No Food Insecurity  . Worried About Programme researcher, broadcasting/film/video in the Last Year: Never true  . Ran Out of Food in the Last Year: Never true  Transportation Needs: No Transportation Needs  . Lack of Transportation (Medical): No  . Lack of Transportation (Non-Medical): No  Physical Activity:   . Days of Exercise per Week:   . Minutes of Exercise per Session:   Stress:   . Feeling  of Stress :   Social Connections:   . Frequency of Communication with Friends and Family:   . Frequency of Social Gatherings with Friends and Family:   . Attends Religious Services:   . Active Member of Clubs or Organizations:   . Attends Banker Meetings:   Marland Kitchen Marital Status:   Intimate Partner Violence:   . Fear of Current or Ex-Partner:   . Emotionally Abused:   Marland Kitchen Physically Abused:   . Sexually Abused:     Outpatient Medications Prior to Visit  Medication Sig Dispense Refill  . cetirizine (ZYRTEC) 10 MG tablet Take 1 tablet (10 mg total) by mouth daily. 90 tablet 3  . dicyclomine (BENTYL) 10 MG capsule Take 1 capsule (10 mg total) by mouth 4 (four) times daily for 14 days. 56 capsule 0  . fluticasone (FLONASE) 50 MCG/ACT nasal spray PLACE 1 SPRAY INTO BOTH NOSTRILS DAILY FOR 20 DAYS. 16 g 1  . lisinopril (ZESTRIL) 40 MG tablet Take 1 tablet (40 mg total) by mouth daily. 30 tablet 1  . metoCLOPramide (REGLAN) 10 MG tablet Take 1 tablet (10 mg total) by mouth every 6 (six) hours. 30 tablet 0  . pantoprazole (PROTONIX) 40 MG  tablet Take 1 tablet (40 mg total) by mouth daily. 30 tablet 1   No facility-administered medications prior to visit.    No Known Allergies  Review of Systems  Eyes: Negative for visual disturbance.  Respiratory: Negative for cough and shortness of breath.   Cardiovascular: Negative for chest pain, palpitations and leg swelling.  Gastrointestinal: Negative for nausea.  Neurological: Negative for weakness, light-headedness and headaches.       Objective:    Physical Exam Constitutional:      General: She is not in acute distress.    Appearance: Normal appearance.  Cardiovascular:     Rate and Rhythm: Normal rate and regular rhythm.  Pulmonary:     Effort: Pulmonary effort is normal.     Breath sounds: Normal breath sounds.  Musculoskeletal:     Right lower leg: No edema.     Left lower leg: No edema.  Neurological:     Mental  Status: She is alert.     BP (!) 147/88 (BP Location: Left Arm, Patient Position: Sitting, Cuff Size: Normal)   Pulse 82   Temp 98.1 F (36.7 C) (Oral)   Ht 5\' 7"  (1.702 m)   Wt (!) 214 lb 3.2 oz (97.2 kg)   SpO2 100%   BMI 33.55 kg/m  Wt Readings from Last 3 Encounters:  04/17/20 (!) 214 lb 3.2 oz (97.2 kg)  03/23/20 212 lb 1.6 oz (96.2 kg)  03/21/20 210 lb (95.3 kg)    Health Maintenance Due  Topic Date Due  . TETANUS/TDAP  09/22/2017  . INFLUENZA VACCINE  04/22/2020    There are no preventive care reminders to display for this patient.   Lab Results  Component Value Date   TSH 1.430 10/02/2016   Lab Results  Component Value Date   WBC 5.9 01/26/2020   HGB 13.7 01/26/2020   HCT 41.6 01/26/2020   MCV 87.6 01/26/2020   PLT 471 (H) 01/26/2020   Lab Results  Component Value Date   NA 141 04/17/2020   K 3.8 04/17/2020   CO2 22 04/17/2020   GLUCOSE 96 04/17/2020   BUN 9 04/17/2020   CREATININE 1.00 04/17/2020   BILITOT 0.6 01/26/2020   ALKPHOS 128 (H) 01/26/2020   AST 15 01/26/2020   ALT 12 01/26/2020   PROT 7.8 01/26/2020   ALBUMIN 4.0 01/26/2020   CALCIUM 9.5 04/17/2020   ANIONGAP 9 01/26/2020   No results found for: CHOL No results found for: HDL No results found for: LDLCALC No results found for: TRIG No results found for: CHOLHDL Lab Results  Component Value Date   HGBA1C 5.7 (A) 08/08/2019       Assessment & Plan:   Problem List Items Addressed This Visit      Cardiovascular and Mediastinum   HYPERTENSION, BENIGN ESSENTIAL - Primary    Patient presents for 1 month follow-up after increasing Lisinopril to full dose. She brought her blood pressure log which shows improved readings consistent with today's in clinic. She is tolerating the medication well without side effects.  Since she is not quite at goal for an otherwise young, healthy female will add low dose amlodipine and re-check in 6 weeks.  BMP today shows stable renal function and  electrolytes.       Relevant Medications   amLODipine (NORVASC) 5 MG tablet   Other Relevant Orders   BMP8+Anion Gap (Completed)       Meds ordered this encounter  Medications  . amLODipine (NORVASC) 5 MG  tablet    Sig: Take 1 tablet (5 mg total) by mouth daily.    Dispense:  30 tablet    Refill:  1     Tiara Maultsby D Rayley Gao, DO

## 2020-04-27 DIAGNOSIS — H01009 Unspecified blepharitis unspecified eye, unspecified eyelid: Secondary | ICD-10-CM | POA: Insufficient documentation

## 2020-04-27 NOTE — Progress Notes (Signed)
Internal Medicine Clinic Attending ° °Case discussed with Dr. Krienke  At the time of the visit.  We reviewed the resident’s history and exam and pertinent patient test results.  I agree with the assessment, diagnosis, and plan of care documented in the resident’s note.  °

## 2020-04-27 NOTE — Assessment & Plan Note (Signed)
Patient reports that yesterday morning she woke up and noted that her right lower lid was mildly swollen and tender, this then progressed to the upper part of her eyelid which is now swollen, red, and mildly tender.  She reports that it was itchy has been using over-the-counter eyedrops that have resolved the itchiness.  She denies any eye drainage, crusting of the eyes, vision changes, pain with eye movement, fevers, chills, nausea, vomiting, headaches, lightheadedness, or other symptoms.  She denies wearing any make-up or having any change in her environment.  Denies any trauma to the area.  On exam her right upper eyelid is mildly swollen, erythematous, and tender, no pain noted with extraocular movements, with mild conjunctival injection.  Overall symptoms seem to be consistent with blepharitis.  -Advised to use baby shampoo to keep the area clean, avoid putting things in and around the eyes -Erythromycin ointment -Advised to use warm compresses -Provided information on Blepharitis -Advised to contact us if she develops blurry vision, pain with eye movement, or other new symptoms

## 2020-04-30 NOTE — Progress Notes (Signed)
Internal Medicine Clinic Attending  Case discussed with Dr. Bloomfield  At the time of the visit.  We reviewed the resident's history and exam and pertinent patient test results.  I agree with the assessment, diagnosis, and plan of care documented in the resident's note.  

## 2020-05-09 ENCOUNTER — Other Ambulatory Visit: Payer: Self-pay

## 2020-05-09 ENCOUNTER — Ambulatory Visit (INDEPENDENT_AMBULATORY_CARE_PROVIDER_SITE_OTHER): Payer: Medicaid Other

## 2020-05-09 VITALS — Wt 213.6 lb

## 2020-05-09 DIAGNOSIS — Z3042 Encounter for surveillance of injectable contraceptive: Secondary | ICD-10-CM

## 2020-05-09 MED ORDER — MEDROXYPROGESTERONE ACETATE 150 MG/ML IM SUSP
150.0000 mg | Freq: Once | INTRAMUSCULAR | Status: AC
  Administered 2020-05-09: 150 mg via INTRAMUSCULAR

## 2020-05-09 NOTE — Progress Notes (Signed)
Patient seen and assessed by nursing staff.  Agree with documentation and plan.  

## 2020-05-09 NOTE — Progress Notes (Signed)
Raven Webb here for Depo-Provera  Injection.  Injection administered without complication. Patient will return in 3 months for next injection.  Marjo Bicker, RN 05/09/2020  10:39 AM

## 2020-05-29 ENCOUNTER — Other Ambulatory Visit: Payer: Self-pay | Admitting: Internal Medicine

## 2020-05-29 NOTE — Telephone Encounter (Signed)
This patient is due for a 6-week follow-up for her hypertension. I have messaged the front desk to have this scheduled. Does she have enough of her lisinopril to make it to her next appointment?

## 2020-06-08 ENCOUNTER — Telehealth: Payer: Self-pay | Admitting: *Deleted

## 2020-06-08 ENCOUNTER — Ambulatory Visit: Payer: Medicaid Other | Admitting: Internal Medicine

## 2020-06-08 ENCOUNTER — Encounter: Payer: Self-pay | Admitting: Internal Medicine

## 2020-06-08 ENCOUNTER — Other Ambulatory Visit: Payer: Self-pay

## 2020-06-08 DIAGNOSIS — R110 Nausea: Secondary | ICD-10-CM

## 2020-06-08 DIAGNOSIS — I10 Essential (primary) hypertension: Secondary | ICD-10-CM | POA: Diagnosis not present

## 2020-06-08 MED ORDER — HYDROCHLOROTHIAZIDE 12.5 MG PO CAPS: 12.5000 mg | ORAL_CAPSULE | Freq: Every day | ORAL | 1 refills | Status: DC

## 2020-06-08 MED ORDER — METOCLOPRAMIDE HCL 10 MG PO TABS: 10.0000 mg | ORAL_TABLET | Freq: Four times a day (QID) | ORAL | 0 refills | Status: DC

## 2020-06-08 NOTE — Telephone Encounter (Signed)
Rx resent.

## 2020-06-08 NOTE — Patient Instructions (Signed)
I have sent a new blood pressure medicine to your pharmacy called hydrochlorothiazide. I would like you to try taking 25mg  but if you are not able to, it is ok to back down to 12.5mg  for a few weeks first.  I would like you to come back in 4 weeks so we can recheck labs and your blood pressure.

## 2020-06-08 NOTE — Telephone Encounter (Signed)
Patient called in stating HCTZ was sent to correct pharmacy [facility]) but the reglan was sent to [facility]. Requesting it be resent to [facility]. Reglan cancelled at [facility]) and [facility] removed from patient's pharmacy list. Kinnie Feil, BSN, RN-BC

## 2020-06-09 NOTE — Assessment & Plan Note (Addendum)
Blood pressure remains uncontrolled in the clinic today.  Currently on lisinopril 40mg  and amlodipine 5mg . Amlodipine added at her last clinic visit on 8/5 with Dr. .  BP Readings from Last 3 Encounters:  06/08/20 (!) 145/89  04/26/20 (!) 148/93  04/17/20 (!) 147/88   I have tried putting her on hctz in the past however she had to stop due to feeling light headed. I would like to have her try this again as those sx were when she was still struggling with GI sx prior to her cholecystectomy. She does still have nausea but endorses adequate po intake.  Plan --initiate hctz 25mg  --f/u in 4-6w for recheck and labs --if she is unable to tolerate the hctz, recommend trying spironolactone if K <4.5.  --consider obtaining plasma renin-aldosterone levels if blood pressure remains uncontrolled at next visit prior to starting spironolactone.

## 2020-06-09 NOTE — Progress Notes (Signed)
Office Visit   Patient ID: Raven Webb, female    DOB: [birth year], 34 y.o.   MRN: [id number]  Subjective:  CC: chronic uncontrolled hypertension, chronic nausea  HPI 34 y.o. presents today for evaluation of the above medical issues.  HYPERTENSION FOLLOW-UP: Current medications: amlodipine 5mg , lisinopril 40mg  Have you taken blood pressure medication(s) today: yes Med Adherence: [x]  Yes    []  No Medication side effects: []  Yes    [x]  No Adherence with salt restriction: [x]  Yes    []  No Exercise: Yes []  No [x]  SOB? []  Yes [x]  No Chest Pain?: []  Yes    [x]  No Leg swelling?: []  Yes [x]  No Headaches?: []  Yes    [x]  No Dizziness? []  Yes    [x]  No  She also notes that she has been struggling with post-prandial nausea since her cholecystectomy back in May. She notes that it occurs almost immediately after eating anything. Does not actually vomit. Denies other GI sx including abd pain, diarrhea or constipation. Denies diaphoresis with eating. Reglan helps with her symptoms. Chart review indicates that she had undergone an EGD in April which was unremarkable.  She reports that her prior GI sx have resolved since the cholecystectomy.     ACTIVE MEDICATIONS   Current Outpatient Medications on File Prior to Visit  Medication Sig Dispense Refill  . amLODipine (NORVASC) 5 MG tablet Take 5 mg by mouth daily. Medication dc in error; pt initially reported not taking and requested removal. Verified pt is taking med.    . fluticasone (FLONASE) 50 MCG/ACT nasal spray PLACE 1 SPRAY INTO BOTH NOSTRILS DAILY FOR 20 DAYS. 16 g 1  . lisinopril (ZESTRIL) 40 MG tablet TAKE 1 TABLET BY MOUTH EVERY DAY 30 tablet 1   No current facility-administered medications on file prior to visit.    ROS  Review of Systems  Constitutional: Negative for chills, diaphoresis, fatigue and fever.  Cardiovascular: Negative for chest pain and leg swelling.  Gastrointestinal: Positive for nausea. Negative for abdominal  distention, abdominal pain, constipation, diarrhea and vomiting.  Neurological: Negative for dizziness, syncope, light-headedness and headaches.    Objective:   BP (!) 145/89 (BP Location: Left Arm, Patient Position: Sitting, Cuff Size: Normal)   Pulse 71   Wt 221 lb (100.2 kg)   SpO2 100%   BMI 34.61 kg/m  Wt Readings from Last 3 Encounters:  06/08/20 221 lb (100.2 kg)  05/09/20 213 lb 9.6 oz (96.9 kg)  04/26/20 214 lb 12.8 oz (97.4 kg)   BP Readings from Last 3 Encounters:  06/08/20 (!) 145/89  04/26/20 (!) 148/93  04/17/20 (!) 147/88   Physical Exam Constitutional:      Appearance: Normal appearance.  Cardiovascular:     Rate and Rhythm: Normal rate and regular rhythm.  Pulmonary:     Effort: Pulmonary effort is normal.     Breath sounds: Normal breath sounds.  Abdominal:     General: There is no distension.     Health Maintenance:   Health Maintenance  Topic Date Due  . TETANUS/TDAP  09/22/2017  . INFLUENZA VACCINE  Never done  . PAP SMEAR-Modifier  04/27/2021  . COVID-19 Vaccine  Completed  . Hepatitis C Screening  Completed  . HIV Screening  Completed     Assessment & Plan:   Problem List Items Addressed This Visit      Cardiovascular and Mediastinum   HYPERTENSION, BENIGN ESSENTIAL    Blood pressure remains uncontrolled in the clinic today.  Currently on lisinopril 40mg  and amlodipine 5mg . Amlodipine added at her last clinic visit on 8/5 with Dr. .  BP Readings from Last 3 Encounters:  06/08/20 (!) 145/89  04/26/20 (!) 148/93  04/17/20 (!) 147/88   I have tried putting her on hctz in the past however she had to stop due to feeling light headed. I would like to have her try this again as those sx were when she was still struggling with GI sx prior to her cholecystectomy. She does still have nausea but endorses adequate po intake.  Plan --initiate hctz 25mg  --f/u in 4-6w for recheck and labs --if she is unable to tolerate the hctz,  recommend trying spironolactone if K <4.5.  --consider obtaining plasma renin-aldosterone levels if blood pressure remains uncontrolled at next visit prior to starting spironolactone.      Relevant Medications   hydrochlorothiazide (MICROZIDE) 12.5 MG capsule     Other   Chronic nausea    Pt notes persistant nausea since her cholecystectomy back in may. She has already had an EGD in April so I doubt anything big has changes since then. Although she does not have diabetes, one could consider gastroparesis. She follows with Dr. 04/19/20 with GI. Unfortunately, I am unable to view his records to see what his thoughts are on this and what kind of work up he has done so far. Plan: I refilled her reglan for her today. I also recommended that she follow up with Dr. for this nausea as I can not see what has already been done. If she is unable to get in to see him, can consider obtaining a gastric emptying study.           Pt discussed with Dr. May, MD Internal Medicine Resident PGY-2 [facility] Pager: [phone] 06/09/2020 11:21 AM

## 2020-06-09 NOTE — Assessment & Plan Note (Signed)
Pt notes persistant nausea since her cholecystectomy back in may. She has already had an EGD in April so I doubt anything big has changes since then. Although she does not have diabetes, one could consider gastroparesis. She follows with Dr. Elnoria Howard with GI. Unfortunately, I am unable to view his records to see what his thoughts are on this and what kind of work up he has done so far. Plan: I refilled her reglan for her today. I also recommended that she follow up with Dr. Elnoria [facility] for this nausea as I can not see what has already been done. If she is unable to get in to see him, can consider obtaining a gastric emptying study.

## 2020-06-13 NOTE — Progress Notes (Signed)
Internal Medicine Clinic Attending  Case discussed with Dr. Christian  At the time of the visit.  We reviewed the resident's history and exam and pertinent patient test results.  I agree with the assessment, diagnosis, and plan of care documented in the resident's note.  

## 2020-06-28 ENCOUNTER — Other Ambulatory Visit: Payer: Self-pay | Admitting: Internal Medicine

## 2020-06-28 ENCOUNTER — Other Ambulatory Visit: Payer: Self-pay | Admitting: Student

## 2020-07-06 ENCOUNTER — Other Ambulatory Visit: Payer: Self-pay

## 2020-07-06 ENCOUNTER — Encounter: Payer: Self-pay | Admitting: Internal Medicine

## 2020-07-06 ENCOUNTER — Ambulatory Visit: Payer: Medicaid Other | Admitting: Internal Medicine

## 2020-07-06 VITALS — BP 139/83 | HR 69 | Temp 98.1°F | Ht 67.0 in | Wt 223.2 lb

## 2020-07-06 DIAGNOSIS — I10 Essential (primary) hypertension: Secondary | ICD-10-CM

## 2020-07-06 MED ORDER — HYDROCHLOROTHIAZIDE 25 MG PO TABS
25.0000 mg | ORAL_TABLET | Freq: Every day | ORAL | 1 refills | Status: DC
Start: 2020-07-06 — End: 2020-07-07

## 2020-07-06 MED ORDER — AMLODIPINE BESYLATE 10 MG PO TABS: 10.0000 mg | ORAL_TABLET | Freq: Every day | ORAL | 1 refills | Status: DC

## 2020-07-06 NOTE — Patient Instructions (Signed)
Ms. Fullen, It was good seeing you again. Glad to see we are making progress with the blood pressure.   I'm actually going to increase your amlodipine and HCTZ which should hopefully get you to goal. I'm checking your kidney function and electrolytes today.  We will see you back in another 4 weeks to see how things are doing. Continue to check your blood pressure at home so we have as much information to go off of as possible.   Take care, Dr. Chesley Mires

## 2020-07-06 NOTE — Progress Notes (Signed)
Acute Office Visit  Subjective:    Patient ID: Raven Webb, female    DOB: 02/26/1986, 34 y.o.   MRN: [id number]  Chief Complaint  Patient presents with   Follow-up    HPI Patient is in today for follow-up on HTN. Please see problem based charting for further details.   Past Medical History:  Diagnosis Date   GERD (gastroesophageal reflux disease)    Hypertension    IBS (irritable bowel syndrome)    UTI (lower urinary tract infection)     Past Surgical History:  Procedure Laterality Date   WISDOM TOOTH EXTRACTION      Family History  Problem Relation Age of Onset   Hypertension Mother    Diabetes Mother    Hypertension Father    Hypertension Sister    Diabetes Sister    Hypertension Maternal Uncle    Kidney disease Maternal Uncle     Social History   Socioeconomic History   Marital status: Single    Spouse name: Not on file   Number of children: Not on file   Years of education: Not on file   Highest education level: Not on file  Occupational History   Not on file  Tobacco Use   Smoking status: Never Smoker   Smokeless tobacco: Never Used  Vaping Use   Vaping Use: Never used  Substance and Sexual Activity   Alcohol use: No    Alcohol/week: 0.0 standard drinks   Drug use: No   Sexual activity: Not on file  Other Topics Concern   Not on file  Social History Narrative   Not on file   Social Determinants of Health   Financial Resource Strain:    Difficulty of Paying Living Expenses: Not on file  Food Insecurity: No Food Insecurity   Worried About Running Out of Food in the Last Year: Never true   Ran Out of Food in the Last Year: Never true  Transportation Needs: No Transportation Needs   Lack of Transportation (Medical): No   Lack of Transportation (Non-Medical): No  Physical Activity:    Days of Exercise per Week: Not on file   Minutes of Exercise per Session: Not on file  Stress:    Feeling of Stress :  Not on file  Social Connections:    Frequency of Communication with Friends and Family: Not on file   Frequency of Social Gatherings with Friends and Family: Not on file   Attends Religious Services: Not on file   Active Member of Clubs or Organizations: Not on file   Attends Banker Meetings: Not on file   Marital Status: Not on file  Intimate Partner Violence:    Fear of Current or Ex-Partner: Not on file   Emotionally Abused: Not on file   Physically Abused: Not on file   Sexually Abused: Not on file    Outpatient Medications Prior to Visit  Medication Sig Dispense Refill   lisinopril (ZESTRIL) 40 MG tablet TAKE ONE TABLET BY MOUTH ONCE DAILY 30 tablet 1   fluticasone (FLONASE) 50 MCG/ACT nasal spray PLACE 1 SPRAY INTO BOTH NOSTRILS DAILY FOR 20 DAYS. 16 g 1   metoCLOPramide (REGLAN) 10 MG tablet Take 1 tablet (10 mg total) by mouth every 6 (six) hours. 30 tablet 0   amLODipine (NORVASC) 5 MG tablet TAKE ONE TABLET BY MOUTH ONCE DAILY 30 tablet 2   hydrochlorothiazide (MICROZIDE) 12.5 MG capsule Take 1 capsule (12.5 mg total) by mouth daily. 60  capsule 1   No facility-administered medications prior to visit.    No Known Allergies  Review of Systems  Constitutional: Negative for fatigue.  Eyes: Negative for visual disturbance.  Respiratory: Negative for cough and shortness of breath.   Cardiovascular: Negative for chest pain, palpitations and leg swelling.  Gastrointestinal: Negative for nausea.  Neurological: Negative for syncope, light-headedness and headaches.       Objective:    Physical Exam Constitutional:      General: She is not in acute distress.    Appearance: Normal appearance.  Cardiovascular:     Rate and Rhythm: Normal rate and regular rhythm.  Pulmonary:     Effort: Pulmonary effort is normal.     Breath sounds: Normal breath sounds.  Musculoskeletal:     Right lower leg: No edema.     Left lower leg: No edema.    Neurological:     General: No focal deficit present.     Mental Status: She is alert.  Psychiatric:        Mood and Affect: Mood normal.        Behavior: Behavior normal.     BP 139/83 (BP Location: Right Arm, Patient Position: Sitting, Cuff Size: Small)    Pulse 69    Temp 98.1 F (36.7 C) (Oral)    Ht 5\' 7"  (1.702 m)    Wt 223 lb 3.2 oz (101.2 kg)    SpO2 100%    BMI 34.96 kg/m  Wt Readings from Last 3 Encounters:  07/06/20 223 lb 3.2 oz (101.2 kg)  06/08/20 221 lb (100.2 kg)  05/09/20 213 lb 9.6 oz (96.9 kg)    Health Maintenance Due  Topic Date Due   TETANUS/TDAP  09/22/2017   INFLUENZA VACCINE  Never done    There are no preventive care reminders to display for this patient.   Lab Results  Component Value Date   TSH 1.430 10/02/2016   Lab Results  Component Value Date   WBC 5.9 01/26/2020   HGB 13.7 01/26/2020   HCT 41.6 01/26/2020   MCV 87.6 01/26/2020   PLT 471 (H) 01/26/2020   Lab Results  Component Value Date   NA 139 07/06/2020   K 4.1 07/06/2020   CO2 23 07/06/2020   GLUCOSE 90 07/06/2020   BUN 10 07/06/2020   CREATININE 1.06 (H) 07/06/2020   BILITOT 0.6 01/26/2020   ALKPHOS 128 (H) 01/26/2020   AST 15 01/26/2020   ALT 12 01/26/2020   PROT 7.8 01/26/2020   ALBUMIN 4.0 01/26/2020   CALCIUM 9.5 07/06/2020   ANIONGAP 9 01/26/2020   No results found for: CHOL No results found for: HDL No results found for: LDLCALC No results found for: TRIG No results found for: CHOLHDL Lab Results  Component Value Date   HGBA1C 5.7 (A) 08/08/2019       Assessment & Plan:   Problem List Items Addressed This Visit      Cardiovascular and Mediastinum   HYPERTENSION, BENIGN ESSENTIAL - Primary    BP Readings from Last 3 Encounters:  07/06/20 139/83  06/08/20 (!) 145/89  04/26/20 (!) 148/93   Patient here for close follow-up on blood pressure management as we have been titrating medications.  At last visit, HCTZ 12.5 mg daily was added to her  regimen of amlodipine 5 mg and lisinopril 40 mg daily.  Her blood pressure reading today is better than last 2 visits, but she notes her home readings have still been in 140s-150s  on average. She tolerated addition of low-dose HCTZ well. Will plan to increase to 25 mg dose, as well as increase amlodipine to 10 mg which should hopefully get her to goal.  BMP today shows stable renal function and electrolytes. Repeat at follow-up visit in 4 weeks.       Relevant Medications   hydrochlorothiazide (HYDRODIURIL) 25 MG tablet   amLODipine (NORVASC) 10 MG tablet   Other Relevant Orders   BMP8+Anion Gap (Completed)       Meds ordered this encounter  Medications   DISCONTD: hydrochlorothiazide (HYDRODIURIL) 25 MG tablet    Sig: Take 1 tablet (25 mg total) by mouth daily.    Dispense:  30 tablet    Refill:  1   DISCONTD: amLODipine (NORVASC) 10 MG tablet    Sig: Take 1 tablet (10 mg total) by mouth daily.    Dispense:  30 tablet    Refill:  1   hydrochlorothiazide (HYDRODIURIL) 25 MG tablet    Sig: Take 1 tablet (25 mg total) by mouth daily.    Dispense:  30 tablet    Refill:  1   amLODipine (NORVASC) 10 MG tablet    Sig: Take 1 tablet (10 mg total) by mouth daily.    Dispense:  30 tablet    Refill:  1     Kensley Valladares D Joakim Huesman, DO

## 2020-07-07 ENCOUNTER — Encounter: Payer: Self-pay | Admitting: Internal Medicine

## 2020-07-07 LAB — BMP8+ANION GAP
Anion Gap: 15 mmol/L (ref 10.0–18.0)
BUN/Creatinine Ratio: 9 (ref 9–23)
BUN: 10 mg/dL (ref 6–20)
CO2: 23 mmol/L (ref 20–29)
Calcium: 9.5 mg/dL (ref 8.7–10.2)
Chloride: 101 mmol/L (ref 96–106)
Creatinine, Ser: 1.06 mg/dL — ABNORMAL HIGH (ref 0.57–1.00)
GFR calc Af Amer: 80 mL/min/{1.73_m2} (ref >59)
GFR calc non Af Amer: 70 mL/min/{1.73_m2} (ref >59)
Glucose: 90 mg/dL (ref 65–99)
Potassium: 4.1 mmol/L (ref 3.5–5.2)
Sodium: 139 mmol/L (ref 134–144)

## 2020-07-07 MED ORDER — AMLODIPINE BESYLATE 10 MG PO TABS: 10.0000 mg | ORAL_TABLET | Freq: Every day | ORAL | 1 refills | Status: DC

## 2020-07-07 MED ORDER — HYDROCHLOROTHIAZIDE 25 MG PO TABS
25.0000 mg | ORAL_TABLET | Freq: Every day | ORAL | 1 refills | Status: DC
Start: 2020-07-07 — End: 2020-08-01

## 2020-07-07 NOTE — Assessment & Plan Note (Signed)
BP Readings from Last 3 Encounters:  07/06/20 139/83  06/08/20 (!) 145/89  04/26/20 (!) 148/93   Patient here for close follow-up on blood pressure management as we have been titrating medications.  At last visit, HCTZ 12.5 mg daily was added to her regimen of amlodipine 5 mg and lisinopril 40 mg daily.  Her blood pressure reading today is better than last 2 visits, but she notes her home readings have still been in 140s-150s on average. She tolerated addition of low-dose HCTZ well. Will plan to increase to 25 mg dose, as well as increase amlodipine to 10 mg which should hopefully get her to goal.  BMP today shows stable renal function and electrolytes. Repeat at follow-up visit in 4 weeks.

## 2020-07-09 ENCOUNTER — Encounter: Payer: Self-pay | Admitting: Internal Medicine

## 2020-07-12 ENCOUNTER — Encounter: Payer: Self-pay | Admitting: Internal Medicine

## 2020-07-16 ENCOUNTER — Encounter: Payer: Self-pay | Admitting: Internal Medicine

## 2020-07-16 DIAGNOSIS — J383 Other diseases of vocal cords: Secondary | ICD-10-CM

## 2020-07-25 ENCOUNTER — Other Ambulatory Visit: Payer: Self-pay

## 2020-07-25 ENCOUNTER — Ambulatory Visit (INDEPENDENT_AMBULATORY_CARE_PROVIDER_SITE_OTHER): Payer: Medicaid Other | Admitting: *Deleted

## 2020-07-25 VITALS — BP 142/91 | HR 71 | Ht 67.0 in | Wt 224.9 lb

## 2020-07-25 DIAGNOSIS — Z3042 Encounter for surveillance of injectable contraceptive: Secondary | ICD-10-CM | POA: Diagnosis not present

## 2020-07-25 MED ORDER — MEDROXYPROGESTERONE ACETATE 150 MG/ML IM SUSP
150.0000 mg | Freq: Once | INTRAMUSCULAR | Status: AC
  Administered 2020-07-25: 150 mg via INTRAMUSCULAR

## 2020-07-25 NOTE — Progress Notes (Signed)
Raven Webb here for Depo-Provera Injection. Injection administered without complication. Patient will return in 3 months for next injection between 10/10/20 and 10/24/20. Next annual visit due 02/2021.   [name],RN 07/25/2020  10:06 AM

## 2020-07-30 NOTE — Progress Notes (Signed)
Internal Medicine Clinic Attending  Case discussed with Dr. Bloomfield at the time of the visit.  We reviewed the resident's history and exam and pertinent patient test results.  I agree with the assessment, diagnosis, and plan of care documented in the resident's note.  Mirren Gest, M.D., Ph.D.  

## 2020-08-01 ENCOUNTER — Other Ambulatory Visit: Payer: Self-pay | Admitting: Internal Medicine

## 2020-08-01 MED ORDER — HYDROCHLOROTHIAZIDE 25 MG PO TABS: 25.0000 mg | ORAL_TABLET | Freq: Every day | ORAL | 5 refills | Status: DC

## 2020-08-01 NOTE — Telephone Encounter (Signed)
Refill request declined because dose is lower than the prescribed dose. Prescription sent for correct dose.

## 2020-08-19 ENCOUNTER — Other Ambulatory Visit: Payer: Self-pay | Admitting: Internal Medicine

## 2020-08-20 MED ORDER — METOCLOPRAMIDE HCL 10 MG PO TABS: 10.0000 mg | ORAL_TABLET | Freq: Four times a day (QID) | ORAL | 0 refills | Status: DC

## 2020-08-30 ENCOUNTER — Other Ambulatory Visit: Payer: Self-pay | Admitting: Internal Medicine

## 2020-08-30 DIAGNOSIS — R059 Cough, unspecified: Secondary | ICD-10-CM

## 2020-08-30 MED ORDER — AMLODIPINE BESYLATE 10 MG PO TABS: 10.0000 mg | ORAL_TABLET | Freq: Every day | ORAL | 1 refills | Status: DC

## 2020-08-30 NOTE — Telephone Encounter (Signed)
Front desk--please arrange an appointment with one of our providers at her earliest convenience. Thank you

## 2020-09-03 NOTE — Telephone Encounter (Signed)
Spoke with the patient this morning.  She has sch an appt to f/u with you on 09/10/2020 @ 2:15 pm.

## 2020-09-03 NOTE — Telephone Encounter (Signed)
Great. Thank you.

## 2020-09-08 NOTE — Progress Notes (Deleted)
   Office Visit   Patient ID: Raven Webb, female    DOB: [birth year], 34 y.o.   MRN: [id number]  Subjective:  CC: chronic hypertension  HPI 34 y.o. presents today for follow up of chronic poorly controlled hypertension. Her last office visit was in October with Dr. Chesley Mires at which time her blood pressure was improved but remained above goal. Her hctz was increased to 25mg  and amlodipine was increased to 10mg .     ACTIVE MEDICATIONS   Outpatient Medications Prior to Visit  Medication Sig Dispense Refill  . fluticasone (FLONASE) 50 MCG/ACT nasal spray PLACE 1 SPRAY INTO BOTH NOSTRILS DAILY FOR 20 DAYS. 16 g 1  . amLODipine (NORVASC) 10 MG tablet Take 1 tablet (10 mg total) by mouth daily. 30 tablet 1  . hydrochlorothiazide (HYDRODIURIL) 25 MG tablet Take 1 tablet (25 mg total) by mouth daily. 30 tablet 5  . lisinopril (ZESTRIL) 40 MG tablet TAKE ONE TABLET BY MOUTH ONCE DAILY 30 tablet 1  . metoCLOPramide (REGLAN) 10 MG tablet Take 1 tablet (10 mg total) by mouth every 6 (six) hours. 30 tablet 0   No facility-administered medications prior to visit.     ROS  Review of Systems  Objective:   There were no vitals taken for this visit. Wt Readings from Last 3 Encounters:  07/25/20 224 lb 14.4 oz (102 kg)  07/06/20 223 lb 3.2 oz (101.2 kg)  06/08/20 221 lb (100.2 kg)   BP Readings from Last 3 Encounters:  07/25/20 (!) 142/91  07/06/20 139/83  06/08/20 (!) 145/89   Physical Exam  Health Maintenance:   Health Maintenance  Topic Date Due  . 07/08/20  09/22/2017  . INFLUENZA VACCINE  Never done  . PAP SMEAR-Modifier  04/27/2021  . COVID-19 Vaccine  Completed  . Hepatitis C Screening  Completed  . HIV Screening  Completed     Assessment & Plan:   Problem List Items Addressed This Visit   None       Pt discussed with ***  11/20/2017, MD Internal Medicine Resident PGY-2 06/27/2021 Internal Medicine Residency Pager: [phone] 09/08/2020 8:38  PM

## 2020-09-08 NOTE — Assessment & Plan Note (Deleted)
This encounter is for hypertension follow up.  Current medications: amlodipine 10mg , hctz 25mg , lisinopril 40mg  Blood pressure is **  Plan -repeat BMP today to assess electrolyte status with increase in hctz at last visit

## 2020-09-10 ENCOUNTER — Encounter: Payer: Medicaid Other | Admitting: Internal Medicine

## 2020-09-24 ENCOUNTER — Telehealth: Payer: Self-pay | Admitting: *Deleted

## 2020-09-24 MED ORDER — ALBUTEROL SULFATE HFA 108 (90 BASE) MCG/ACT IN AERS: 1.0000 | INHALATION_SPRAY | Freq: Four times a day (QID) | RESPIRATORY_TRACT | 0 refills | Status: DC | PRN

## 2020-09-24 NOTE — Telephone Encounter (Signed)
I will send in an albuterol inhaler. I would also advise her to purchase a pulse ox to monitor O2 sat if she is not doing so already. If she is having persistent or worsening shortness of breath she should be seen by either urgent care of emergency department.

## 2020-09-24 NOTE — Telephone Encounter (Signed)
Pt would like an inhaler to assist with her breathing, something that will help her not feel like she is not going to be able to breathe.  states she has been on hold for 2 hrs with the antibody appt line

## 2020-09-24 NOTE — Telephone Encounter (Signed)
Provided hotline number to setup antibody infusion.

## 2020-09-24 NOTE — Telephone Encounter (Signed)
Pt states she tested POSITIVE for COVID and would like to get the antibody infusion, ph [phone]. She was exposed 1 week ago Sunday, tested positive Friday, rapid from [facility]. She has improved but still feeling weak, tired, coughing. Can you order the antibodies

## 2020-10-10 ENCOUNTER — Other Ambulatory Visit: Payer: Self-pay

## 2020-10-10 ENCOUNTER — Ambulatory Visit (INDEPENDENT_AMBULATORY_CARE_PROVIDER_SITE_OTHER): Payer: Medicaid Other | Admitting: *Deleted

## 2020-10-10 VITALS — BP 129/95 | HR 73 | Ht 67.0 in | Wt 223.6 lb

## 2020-10-10 DIAGNOSIS — Z3042 Encounter for surveillance of injectable contraceptive: Secondary | ICD-10-CM | POA: Diagnosis not present

## 2020-10-10 MED ORDER — MEDROXYPROGESTERONE ACETATE 150 MG/ML IM SUSP
150.0000 mg | Freq: Once | INTRAMUSCULAR | Status: AC
  Administered 2020-10-10: 150 mg via INTRAMUSCULAR

## 2020-10-10 NOTE — Progress Notes (Signed)
Patient was assessed and managed by nursing staff during this encounter. I have reviewed the chart and agree with the documentation and plan. I have also made any necessary editorial changes.  Jaynie Collins, MD 10/10/2020 10:32 AM

## 2020-10-10 NOTE — Progress Notes (Signed)
Raven Webb here for Depo-Provera Injection. Injection administered without complication. Patient will return in 3 months for next injection between 12/26/20 and 01/09/21. Next annual visit due 03/21/21.   Linda,RN 10/10/2020  10:04 AM

## 2020-11-15 ENCOUNTER — Other Ambulatory Visit: Payer: Self-pay | Admitting: Internal Medicine

## 2020-11-18 MED ORDER — METOCLOPRAMIDE HCL 10 MG PO TABS: 10.0000 mg | ORAL_TABLET | Freq: Four times a day (QID) | ORAL | 0 refills | Status: DC

## 2020-11-19 ENCOUNTER — Other Ambulatory Visit: Payer: Self-pay | Admitting: Internal Medicine

## 2020-11-19 DIAGNOSIS — R059 Cough, unspecified: Secondary | ICD-10-CM

## 2020-11-20 ENCOUNTER — Encounter: Payer: Medicaid Other | Admitting: Internal Medicine

## 2020-11-23 ENCOUNTER — Ambulatory Visit: Payer: Medicaid Other | Admitting: Internal Medicine

## 2020-11-23 ENCOUNTER — Other Ambulatory Visit: Payer: Self-pay

## 2020-11-23 ENCOUNTER — Encounter: Payer: Self-pay | Admitting: Internal Medicine

## 2020-11-23 DIAGNOSIS — R110 Nausea: Secondary | ICD-10-CM | POA: Diagnosis not present

## 2020-11-23 DIAGNOSIS — I10 Essential (primary) hypertension: Secondary | ICD-10-CM

## 2020-11-23 MED ORDER — METOCLOPRAMIDE HCL 10 MG PO TABS: 10.0000 mg | ORAL_TABLET | Freq: Four times a day (QID) | ORAL | 0 refills | Status: DC

## 2020-11-23 NOTE — Assessment & Plan Note (Signed)
Patient presents to the clinic with the following vitals:  Vitals with BMI 11/23/2020 11/23/2020 10/10/2020  Height - 5\' 7"  5\' 7"   Weight - 227 lbs 5 oz 223 lbs 10 oz  BMI - 35.59 35.01  Systolic 130 137  Diastolic 82 85 95  Pulse 70 71 73   She is currently on Norvasc 10 mg daily, HCTZ 25 daily, and Zestril 40 mg daily. Her pressure is well controlled. She has not experienced side effects such as hypotension, dizziness, headaches, or lower extremity edema. She is compliant with her medications.  - Continue current regimen. - Follow up in one month's time.

## 2020-11-23 NOTE — Assessment & Plan Note (Signed)
Patient presents to the clinic for a refill on her Reglan. Given her age, checked for dyskinesia on physical exam, and no dystonic reactions were present. Continue to monitor with use of her Reglan.  - Refilled Reglan - Follow up with GI

## 2020-11-23 NOTE — Patient Instructions (Addendum)
To Raven Webb,   It was a pleasure meeting you today. Today we discussed your blood pressure and refill for your medications. Your blood pressure is doing well, please continue to take your medications as prescribed. I have also put in a refill for your Reglan.  Have a good day! Dolan Amen, MD

## 2020-11-23 NOTE — Progress Notes (Signed)
   CC: BP F/U   HPI:  [name] is a 35 y.o. M/F, with a PMH noted below, who presents to the clinic blood pressure follow up. To see the management of their acute and chronic conditions, please see the A&P note under the Encounters tab.   Past Medical History:  Diagnosis Date  . GERD (gastroesophageal reflux disease)   . Hypertension   . IBS (irritable bowel syndrome)   . UTI (lower urinary tract infection)    Review of Systems:   Review of Systems  Constitutional: Negative for chills, fever, malaise/fatigue and weight loss.  Respiratory: Negative for cough.   Cardiovascular: Negative for chest pain, palpitations and leg swelling.  Gastrointestinal: Negative for abdominal pain, constipation, diarrhea, nausea and vomiting.  Neurological: Negative for dizziness, tingling, tremors and headaches.     Physical Exam:  Vitals:   11/23/20 0951 11/23/20 1035  BP: 137/85 130/82  Pulse: 71 70  Temp: 98.5 F (36.9 C)   TempSrc: Oral   SpO2: 100%   Weight: 227 lb 4.8 oz (103.1 kg)   Height: 5\' 7"  (1.702 m)    Physical Exam Constitutional:      General: She is not in acute distress.    Appearance: Normal appearance. She is not ill-appearing or toxic-appearing.  Cardiovascular:     Rate and Rhythm: Normal rate and regular rhythm.     Pulses: Normal pulses.     Heart sounds: Normal heart sounds.  Pulmonary:     Effort: Pulmonary effort is normal.     Breath sounds: Normal breath sounds. No wheezing, rhonchi or rales.  Abdominal:     General: Abdomen is flat. Bowel sounds are normal.     Palpations: Abdomen is soft.  Musculoskeletal:     Comments: No dystonic reactions appreciated  Skin:    General: Skin is warm and dry.  Neurological:     Mental Status: She is alert and oriented to person, place, and time.  Psychiatric:        Mood and Affect: Mood normal.        Behavior: Behavior normal.      Assessment & Plan:   See Encounters Tab for problem based  charting.  Patient discussed with Dr. 

## 2020-12-04 NOTE — Progress Notes (Signed)
Internal Medicine Clinic Attending ? ?Case discussed with Dr. Winters  At the time of the visit.  We reviewed the resident?s history and exam and pertinent patient test results.  I agree with the assessment, diagnosis, and plan of care documented in the resident?s note.  ?

## 2020-12-26 ENCOUNTER — Other Ambulatory Visit: Payer: Self-pay

## 2020-12-26 ENCOUNTER — Ambulatory Visit (INDEPENDENT_AMBULATORY_CARE_PROVIDER_SITE_OTHER): Payer: Medicaid Other

## 2020-12-26 VITALS — BP 141/96 | HR 74 | Ht 67.0 in | Wt 224.2 lb

## 2020-12-26 DIAGNOSIS — Z3042 Encounter for surveillance of injectable contraceptive: Secondary | ICD-10-CM | POA: Diagnosis not present

## 2020-12-26 MED ORDER — MEDROXYPROGESTERONE ACETATE 150 MG/ML IM SUSP
150.0000 mg | Freq: Once | INTRAMUSCULAR | Status: AC
  Administered 2020-12-26: 150 mg via INTRAMUSCULAR

## 2020-12-26 NOTE — Progress Notes (Signed)
Raven Webb here for Depo-Provera Injection. Injection administered without complication. Patient will return in 3 months for next injection between June 22 and March 27, 2021 . Next annual visit due June 2022.   Isabell Jarvis, RN 12/26/2020  10:01 AM

## 2021-01-02 ENCOUNTER — Encounter: Payer: Self-pay | Admitting: Internal Medicine

## 2021-01-08 ENCOUNTER — Encounter: Payer: Self-pay | Admitting: Internal Medicine

## 2021-01-09 ENCOUNTER — Other Ambulatory Visit: Payer: Self-pay | Admitting: Internal Medicine

## 2021-01-09 DIAGNOSIS — R059 Cough, unspecified: Secondary | ICD-10-CM

## 2021-01-09 MED ORDER — FLUTICASONE PROPIONATE 50 MCG/ACT NA SUSP
1.0000 | Freq: Every day | NASAL | 1 refills | Status: DC
Start: 2021-01-09 — End: 2021-02-21

## 2021-01-20 ENCOUNTER — Encounter: Payer: Self-pay | Admitting: Internal Medicine

## 2021-02-21 ENCOUNTER — Other Ambulatory Visit: Payer: Self-pay | Admitting: Internal Medicine

## 2021-02-21 DIAGNOSIS — R059 Cough, unspecified: Secondary | ICD-10-CM

## 2021-02-21 MED ORDER — FLUTICASONE PROPIONATE 50 MCG/ACT NA SUSP: 1.0000 | Freq: Every day | NASAL | 1 refills | Status: DC

## 2021-02-21 NOTE — Telephone Encounter (Signed)
Please arrange an office visit for hypertension follow up. Thank you

## 2021-03-04 ENCOUNTER — Other Ambulatory Visit: Payer: Self-pay

## 2021-03-04 ENCOUNTER — Encounter: Payer: Self-pay | Admitting: Internal Medicine

## 2021-03-04 ENCOUNTER — Ambulatory Visit (INDEPENDENT_AMBULATORY_CARE_PROVIDER_SITE_OTHER): Payer: Medicaid Other | Admitting: Internal Medicine

## 2021-03-04 VITALS — BP 149/91 | HR 79 | Temp 98.4°F | Ht 67.0 in | Wt 231.3 lb

## 2021-03-04 DIAGNOSIS — Z0000 Encounter for general adult medical examination without abnormal findings: Secondary | ICD-10-CM | POA: Diagnosis not present

## 2021-03-04 DIAGNOSIS — I10 Essential (primary) hypertension: Secondary | ICD-10-CM | POA: Diagnosis not present

## 2021-03-04 DIAGNOSIS — R7303 Prediabetes: Secondary | ICD-10-CM

## 2021-03-04 LAB — POCT GLYCOSYLATED HEMOGLOBIN (HGB A1C): Hemoglobin A1C: 5.6 % (ref 4.0–5.6)

## 2021-03-04 LAB — GLUCOSE, CAPILLARY: Glucose-Capillary: 89 mg/dL (ref 70–99)

## 2021-03-04 MED ORDER — LISINOPRIL 40 MG PO TABS: 40.0000 mg | ORAL_TABLET | Freq: Every day | ORAL | 1 refills | Status: DC

## 2021-03-04 MED ORDER — SPIRONOLACTONE 25 MG PO TABS
12.5000 mg | ORAL_TABLET | Freq: Every day | ORAL | 1 refills | Status: DC
Start: 2021-03-04 — End: 2021-03-05

## 2021-03-04 MED ORDER — AMLODIPINE BESYLATE 10 MG PO TABS: 10.0000 mg | ORAL_TABLET | Freq: Every day | ORAL | 1 refills | Status: DC

## 2021-03-04 MED ORDER — HYDROCHLOROTHIAZIDE 25 MG PO TABS: 25.0000 mg | ORAL_TABLET | Freq: Every day | ORAL | 5 refills | Status: DC

## 2021-03-04 NOTE — Patient Instructions (Signed)
I have ordered a few tests today to rule out certain diseases that can cause your blood pressure to be so high, especially when you are young. Sleep apnea can also cause this and I think it would be good for you to have a sleep study for further evaluation.   I will let you know when I have reviewed your lab tests. I have sent in another blood pressure medicine called spirolactone for your blood pressure. Please wait until I contact you before starting it, in case there is any further testing we need to do that this medicine would interfere with.

## 2021-03-05 ENCOUNTER — Encounter: Payer: Self-pay | Admitting: Internal Medicine

## 2021-03-05 MED ORDER — SPIRONOLACTONE 25 MG PO TABS: 25.0000 mg | ORAL_TABLET | Freq: Every day | ORAL | 1 refills | Status: DC

## 2021-03-05 NOTE — Progress Notes (Signed)
Office Visit   Patient ID: Raven Webb, female    DOB: Sep 20, 1986, 35 y.o.   MRN: [id number]   PCP: Elige Radon, MD   Subjective:  CC: hypertension, prediabetes  Raven Webb is a 35 y.o. year old female who presents for follow up of chronic medical conditions. Please refer to problem based charting for assessment and plan.      ACTIVE MEDICATIONS   Outpatient Medications Prior to Visit  Medication Sig Dispense Refill   albuterol (PROVENTIL HFA) 108 (90 Base) MCG/ACT inhaler Inhale 1-2 puffs into the lungs every 6 (six) hours as needed for wheezing or shortness of breath. 18 g 0   fluticasone (FLONASE) 50 MCG/ACT nasal spray PLACE 1 SPRAY INTO BOTH NOSTRILS DAILY FOR 20 DAYS. 16 g 1   metoCLOPramide (REGLAN) 10 MG tablet Take 1 tablet (10 mg total) by mouth every 6 (six) hours. 60 tablet 0   amLODipine (NORVASC) 10 MG tablet TAKE 1 TABLET (10 MG TOTAL) BY MOUTH DAILY. 30 tablet 1   fluticasone (FLONASE) 50 MCG/ACT nasal spray Place 1 spray into both nostrils daily for 20 days. 16 g 1   hydrochlorothiazide (HYDRODIURIL) 25 MG tablet Take 1 tablet (25 mg total) by mouth daily. 30 tablet 5   lisinopril (ZESTRIL) 40 MG tablet TAKE ONE TABLET BY MOUTH ONCE DAILY 30 tablet 1   No facility-administered medications prior to visit.     Objective:   BP (!) 149/91 (BP Location: Left Arm, Patient Position: Sitting, Cuff Size: Normal)   Pulse 79   Temp 98.4 F (36.9 C) (Oral)   Ht 5\' 7"  (1.702 m)   Wt 231 lb 4.8 oz (104.9 kg)   SpO2 100%   BMI 36.23 kg/m  Wt Readings from Last 3 Encounters:  03/04/21 231 lb 4.8 oz (104.9 kg)  12/26/20 224 lb 3.2 oz (101.7 kg)  11/23/20 227 lb 4.8 oz (103.1 kg)   BP Readings from Last 3 Encounters:  03/04/21 (!) 149/91  12/26/20 (!) 141/96  11/23/20 130/82   Physical Exam General: well appearing Cardiac: RRR, no LE edema Pulm: lungs clear  Health Maintenance:   Health Maintenance  Topic Date Due   COVID-19 Vaccine (3 - Booster  for Pfizer series) 05/30/2020   PAP SMEAR-Modifier  04/27/2021   INFLUENZA VACCINE  04/22/2021   TETANUS/TDAP  03/05/2031   Zoster Vaccines- Shingrix (1 of 2) 02/24/2038   Hepatitis C Screening  Completed   HIV Screening  Completed   Pneumococcal Vaccine 36-71 Years old  Aged Out   HPV VACCINES  Aged Out     Assessment & Plan:   Problem List Items Addressed This Visit       Cardiovascular and Mediastinum   Resistant hypertension    HYPERTENSION FOLLOW-UP: Current medications: amlodipine 10mg , hctz 25mg  daily, lisinopril 40mg  daily Med Adherence: [x]  Yes    []  No Medication side effects: []  Yes    [x]  No  Assessment: Blood pressure remains above goal in the office today. BP Readings from Last 3 Encounters:  03/04/21 (!) 149/91  12/26/20 (!) 141/96  11/23/20 130/82    No significant electrolyte derangements. Renal function normal  Plan -aldosterone/renin ratio -sleep study -start spirolactone 25mg  daily -continue lisinopril, hctz, amlodipine -if above testing is normal, consider renal artery duplex for evaluation of renal artery stenosis -f/u 3 mo        Relevant Medications   hydrochlorothiazide (HYDRODIURIL) 25 MG tablet   amLODipine (NORVASC) 10 MG tablet  lisinopril (ZESTRIL) 40 MG tablet   spironolactone (ALDACTONE) 25 MG tablet     Other   Healthcare maintenance    Tdap provided at today's visit.       Prediabetes - Primary    A1C 5.6 today. Continue lifestyle management. -repeat in 1y       Relevant Orders   POC Hbg A1C (Completed)     Return in about 3 months (around 06/04/2021).   Pt discussed with Dr. Leo Rod, MD Internal Medicine Resident PGY-2 [facility] Pager: [phone] 03/05/2021 10:18 AM

## 2021-03-05 NOTE — Assessment & Plan Note (Addendum)
A1C 5.6 today. Continue lifestyle management. -repeat in 1y

## 2021-03-05 NOTE — Assessment & Plan Note (Signed)
Tdap provided at today's visit.

## 2021-03-05 NOTE — Assessment & Plan Note (Addendum)
HYPERTENSION FOLLOW-UP: Current medications: amlodipine 10mg , hctz 25mg  daily, lisinopril 40mg  daily Med Adherence: [x]  Yes    []  No Medication side effects: []  Yes    [x]  No  Assessment: Blood pressure remains above goal in the office today. BP Readings from Last 3 Encounters:  03/04/21 (!) 149/91  12/26/20 (!) 141/96  11/23/20 130/82    No significant electrolyte derangements. Renal function normal  Plan -aldosterone/renin ratio -sleep study -start spirolactone 25mg  daily -continue lisinopril, hctz, amlodipine -if above testing is normal, consider renal artery duplex for evaluation of renal artery stenosis -f/u 3 mo

## 2021-03-06 ENCOUNTER — Encounter: Payer: Self-pay | Admitting: Internal Medicine

## 2021-03-06 ENCOUNTER — Telehealth: Payer: Self-pay

## 2021-03-06 NOTE — Telephone Encounter (Signed)
Return pt's call - stated her doctor did some tests (on 6/13) and going to call her about whether to start BP meds or not.

## 2021-03-06 NOTE — Telephone Encounter (Signed)
The lab is still in process. Appreciate her checking in though.

## 2021-03-06 NOTE — Telephone Encounter (Signed)
Requesting to speak with a nurse about bp med. Please call pt back.  

## 2021-03-11 ENCOUNTER — Encounter: Payer: Self-pay | Admitting: Internal Medicine

## 2021-03-12 LAB — CMP14 + ANION GAP
ALT: 9 IU/L (ref 0–32)
AST: 18 IU/L (ref 0–40)
Albumin/Globulin Ratio: 1.4 (ref 1.2–2.2)
Albumin: 4.3 g/dL (ref 3.8–4.8)
Alkaline Phosphatase: 136 IU/L — ABNORMAL HIGH (ref 44–121)
Anion Gap: 15 mmol/L (ref 10.0–18.0)
BUN/Creatinine Ratio: 11 (ref 9–23)
BUN: 10 mg/dL (ref 6–20)
Bilirubin Total: <0.2 mg/dL (ref 0.0–1.2)
CO2: 19 mmol/L — ABNORMAL LOW (ref 20–29)
Calcium: 9.5 mg/dL (ref 8.7–10.2)
Chloride: 103 mmol/L (ref 96–106)
Creatinine, Ser: 0.92 mg/dL (ref 0.57–1.00)
Globulin, Total: 3 g/dL (ref 1.5–4.5)
Glucose: 85 mg/dL (ref 65–99)
Potassium: 4.1 mmol/L (ref 3.5–5.2)
Sodium: 137 mmol/L (ref 134–144)
Total Protein: 7.3 g/dL (ref 6.0–8.5)
eGFR: 84 mL/min/{1.73_m2} (ref >59)

## 2021-03-12 LAB — ALDOSTERONE + RENIN ACTIVITY W/ RATIO
ALDOS/RENIN RATIO: 16.7 (ref 0.0–30.0)
ALDOSTERONE: 6.2 ng/dL (ref 0.0–30.0)
Renin: 0.371 ng/mL/hr (ref 0.167–5.380)

## 2021-03-13 ENCOUNTER — Other Ambulatory Visit: Payer: Self-pay

## 2021-03-13 ENCOUNTER — Encounter: Payer: Self-pay | Admitting: Internal Medicine

## 2021-03-13 ENCOUNTER — Other Ambulatory Visit (HOSPITAL_COMMUNITY)
Admission: RE | Admit: 2021-03-13 | Discharge: 2021-03-13 | Disposition: A | Discharge status: Home / Self Care | Payer: Medicaid Other | Source: Ambulatory Visit | Attending: Nurse Practitioner | Admitting: Nurse Practitioner

## 2021-03-13 ENCOUNTER — Encounter: Payer: Self-pay | Admitting: Nurse Practitioner

## 2021-03-13 ENCOUNTER — Ambulatory Visit (INDEPENDENT_AMBULATORY_CARE_PROVIDER_SITE_OTHER): Payer: Medicaid Other | Admitting: Nurse Practitioner

## 2021-03-13 VITALS — BP 148/100 | HR 73 | Wt 228.9 lb

## 2021-03-13 DIAGNOSIS — I10 Essential (primary) hypertension: Secondary | ICD-10-CM | POA: Diagnosis not present

## 2021-03-13 DIAGNOSIS — Z6835 Body mass index (BMI) 35.0-35.9, adult: Secondary | ICD-10-CM | POA: Diagnosis not present

## 2021-03-13 DIAGNOSIS — Z3042 Encounter for surveillance of injectable contraceptive: Secondary | ICD-10-CM | POA: Diagnosis not present

## 2021-03-13 DIAGNOSIS — Z01419 Encounter for gynecological examination (general) (routine) without abnormal findings: Secondary | ICD-10-CM | POA: Diagnosis not present

## 2021-03-13 MED ORDER — MEDROXYPROGESTERONE ACETATE 150 MG/ML IM SUSP
150.0000 mg | Freq: Once | INTRAMUSCULAR | Status: AC
  Administered 2021-03-13: 150 mg via INTRAMUSCULAR

## 2021-03-13 NOTE — Progress Notes (Signed)
GYNECOLOGY ANNUAL PREVENTATIVE CARE ENCOUNTER NOTE  Subjective:   Raven Webb is a 35 y.o. [phone] female here for a routine annual gynecologic exam.  Current complaints: none.   Denies abnormal vaginal bleeding, discharge, pelvic pain, problems with intercourse or other gynecologic concerns.    Gynecologic History No LMP recorded. Patient has had an injection. Contraception: Depo-Provera injections Last Pap: 04-27-2018. Results were: normal   Obstetric History OB History  Gravida Para Term Preterm AB Living  4 3 1 2   3   SAB IAB Ectopic Multiple Live Births               # Outcome Date GA Lbr Len/2nd Weight Sex Delivery Anes PTL Lv  4 Gravida           3 Term           2 Preterm           1 Preterm             Past Medical History:  Diagnosis Date   GERD (gastroesophageal reflux disease)    Hypertension    IBS (irritable bowel syndrome)    UTI (lower urinary tract infection)     Past Surgical History:  Procedure Laterality Date   WISDOM TOOTH EXTRACTION      Current Outpatient Medications on File Prior to Visit  Medication Sig Dispense Refill   albuterol (PROVENTIL HFA) 108 (90 Base) MCG/ACT inhaler Inhale 1-2 puffs into the lungs every 6 (six) hours as needed for wheezing or shortness of breath. 18 g 0   amLODipine (NORVASC) 10 MG tablet Take 1 tablet (10 mg total) by mouth daily. 30 tablet 1   fluticasone (FLONASE) 50 MCG/ACT nasal spray PLACE 1 SPRAY INTO BOTH NOSTRILS DAILY FOR 20 DAYS. 16 g 1   hydrochlorothiazide (HYDRODIURIL) 25 MG tablet Take 1 tablet (25 mg total) by mouth daily. 30 tablet 5   lisinopril (ZESTRIL) 40 MG tablet Take 1 tablet (40 mg total) by mouth daily. 30 tablet 1   metoCLOPramide (REGLAN) 10 MG tablet Take 1 tablet (10 mg total) by mouth every 6 (six) hours. 60 tablet 0   spironolactone (ALDACTONE) 25 MG tablet Take 1 tablet (25 mg total) by mouth daily. 30 tablet 1   No current facility-administered medications on file prior to  visit.    No Known Allergies  Social History   Socioeconomic History   Marital status: Single    Spouse name: Not on file   Number of children: Not on file   Years of education: Not on file   Highest education level: Not on file  Occupational History   Not on file  Tobacco Use   Smoking status: Never   Smokeless tobacco: Never  Vaping Use   Vaping Use: Never used  Substance and Sexual Activity   Alcohol use: No    Alcohol/week: 0.0 standard drinks   Drug use: No   Sexual activity: Not on file  Other Topics Concern   Not on file  Social History Narrative   Not on file   Social Determinants of Health   Financial Resource Strain: Not on file  Food Insecurity: No Food Insecurity   Worried About Running Out of Food in the Last Year: Never true   Ran Out of Food in the Last Year: Never true  Transportation Needs: No Transportation Needs   Lack of Transportation (Medical): No   Lack of Transportation (Non-Medical): No  Physical Activity: Not on  file  Stress: Not on file  Social Connections: Not on file  Intimate Partner Violence: Not on file    Family History  Problem Relation Age of Onset   Hypertension Mother    Diabetes Mother    Hypertension Father    Hypertension Sister    Diabetes Sister    Hypertension Maternal Uncle    Kidney disease Maternal Uncle     The following portions of the patient's history were reviewed and updated as appropriate: allergies, current medications, past family history, past medical history, past social history, past surgical history and problem list.  Review of Systems Pertinent items noted in HPI and remainder of comprehensive ROS otherwise negative.   Objective:  BP (!) 148/100   Pulse 73   Wt 228 lb 14.4 oz (103.8 kg)   BMI 35.85 kg/m  CONSTITUTIONAL: Well-developed, well-nourished female in no acute distress.  HENT:  Normocephalic, atraumatic, External right and left ear normal.  EYES: Conjunctivae and EOM are normal.  Pupils are equal, round.  No scleral icterus.  NECK: Normal range of motion, supple, no masses.  Normal thyroid.  SKIN: Skin is warm and dry. No rash noted. Not diaphoretic. No erythema. No pallor. NEUROLOGIC: Alert and oriented to person, place, and time. Normal reflexes, muscle tone coordination. No cranial nerve deficit noted. PSYCHIATRIC: Normal mood and affect. Normal behavior. Normal judgment and thought content. CARDIOVASCULAR: Normal heart rate noted, regular rhythm RESPIRATORY: Clear to auscultation bilaterally. Effort and breath sounds normal, no problems with respiration noted. BREASTS: Symmetric in size. No masses, skin changes, nipple drainage, or lymphadenopathy. ABDOMEN: Soft, no distention noted.  No tenderness, rebound or guarding.  PELVIC: Normal appearing external genitalia; normal appearing vaginal mucosa and cervix.  No abnormal discharge noted.  Pap smear obtained.  Normal uterine size, no other palpable masses, no uterine or adnexal tenderness. MUSCULOSKELETAL: Normal range of motion. No tenderness.  No cyanosis, clubbing, or edema.    Assessment and Plan:  1. Encounter for annual routine gynecological examination   2. Encounter for surveillance of injectable contraceptive Likes Depo Given today  3. BMI 35.0-35.9,adult Encouraged exercise Currently in school - criminal justice  4. Hypertension, unspecified type Has PCP - meds are being adjusted regularly Next appointment is in 3 months Takes medication regularly - took meds today  Will follow up results of pap smear and manage accordingly. Routine preventative health maintenance measures emphasized. Please refer to After Visit Summary for other counseling recommendations.    Nolene Bernheim, RN, MSN, NP-BC Nurse Practitioner, [facility] [place]

## 2021-03-14 NOTE — Progress Notes (Signed)
Internal Medicine Attending Note:  This patient's plan of care was discussed with the house staff. Please see their note for complete details. I concur with their findings.  Reymundo Poll, MD 03/14/2021, 2:04 PM

## 2021-03-15 LAB — CYTOLOGY - PAP
Comment: NEGATIVE
Diagnosis: NEGATIVE
High risk HPV: NEGATIVE

## 2021-03-18 ENCOUNTER — Encounter: Payer: Self-pay | Admitting: Internal Medicine

## 2021-03-25 ENCOUNTER — Other Ambulatory Visit: Payer: Self-pay | Admitting: Internal Medicine

## 2021-03-26 ENCOUNTER — Encounter: Payer: Self-pay | Admitting: *Deleted

## 2021-03-27 MED ORDER — METOCLOPRAMIDE HCL 10 MG PO TABS: 10.0000 mg | ORAL_TABLET | Freq: Four times a day (QID) | ORAL | 0 refills | Status: DC

## 2021-06-05 ENCOUNTER — Other Ambulatory Visit: Payer: Self-pay | Admitting: Internal Medicine

## 2021-06-05 DIAGNOSIS — I10 Essential (primary) hypertension: Secondary | ICD-10-CM

## 2021-06-11 ENCOUNTER — Ambulatory Visit (INDEPENDENT_AMBULATORY_CARE_PROVIDER_SITE_OTHER): Payer: Medicaid Other

## 2021-06-11 ENCOUNTER — Other Ambulatory Visit: Payer: Self-pay

## 2021-06-11 VITALS — BP 147/102 | HR 68 | Wt 226.0 lb

## 2021-06-11 DIAGNOSIS — Z3042 Encounter for surveillance of injectable contraceptive: Secondary | ICD-10-CM | POA: Diagnosis not present

## 2021-06-11 MED ORDER — MEDROXYPROGESTERONE ACETATE 150 MG/ML IM SUSP
150.0000 mg | Freq: Once | INTRAMUSCULAR | Status: AC
  Administered 2021-06-11: 150 mg via INTRAMUSCULAR

## 2021-06-11 NOTE — Progress Notes (Signed)
Raven Webb here for Depo-Provera Injection. Injection administered without complication. Patient will return in 3 months for next injection between 08/27/21 and 09/10/21. Next annual visit due June 2023.   Marjo Bicker, RN 06/11/2021  9:18 AM

## 2021-06-21 ENCOUNTER — Encounter: Payer: Self-pay | Admitting: Internal Medicine

## 2021-06-21 ENCOUNTER — Ambulatory Visit: Payer: Medicaid Other | Admitting: Internal Medicine

## 2021-06-21 ENCOUNTER — Other Ambulatory Visit: Payer: Self-pay

## 2021-06-21 DIAGNOSIS — I10 Essential (primary) hypertension: Secondary | ICD-10-CM

## 2021-06-21 MED ORDER — OLMESARTAN-AMLODIPINE-HCTZ 40-10-25 MG PO TABS: 1.0000 | ORAL_TABLET | Freq: Every day | ORAL | 1 refills | Status: DC

## 2021-06-21 NOTE — Patient Instructions (Signed)
Your blood pressure looks wonderful today!! I have switched your blood pressure medicine over to a pill that combines all three of your blood pressure medicines.  Please follow up with me in January so we can make sure your blood pressure continues to do well on this regimen.

## 2021-06-21 NOTE — Progress Notes (Signed)
Office Visit   Patient ID: Raven Webb, female    DOB: Mar 20, 1986, 35 y.o.   MRN: [id number]   PCP: Elige Radon, MD   Subjective:  CC: Follow-up (3 month f/u w/ b/p check)   Raven Webb is a 35 y.o. year old female who presents for follow up of the above chronic conditions. Please refer to problem based charting for assessment and plan.   ACTIVE MEDICATIONS   Outpatient Medications Prior to Visit  Medication Sig   albuterol (PROVENTIL HFA) 108 (90 Base) MCG/ACT inhaler Inhale 1-2 puffs into the lungs every 6 (six) hours as needed for wheezing or shortness of breath.   fluticasone (FLONASE) 50 MCG/ACT nasal spray PLACE 1 SPRAY INTO BOTH NOSTRILS DAILY FOR 20 DAYS.   metoCLOPramide (REGLAN) 10 MG tablet Take 1 tablet (10 mg total) by mouth every 6 (six) hours.   [DISCONTINUED] amLODipine (NORVASC) 10 MG tablet TAKE 1 TABLET (10 MG TOTAL) BY MOUTH DAILY.   [DISCONTINUED] hydrochlorothiazide (HYDRODIURIL) 25 MG tablet Take 1 tablet (25 mg total) by mouth daily.   [DISCONTINUED] lisinopril (ZESTRIL) 40 MG tablet TAKE 1 TABLET (40 MG TOTAL) BY MOUTH DAILY.   [DISCONTINUED] spironolactone (ALDACTONE) 25 MG tablet Take 1 tablet (25 mg total) by mouth daily. (Patient not taking: Reported on 06/11/2021)   No facility-administered medications prior to visit.     Objective:   BP 121/68 (BP Location: Left Arm, Patient Position: Sitting, Cuff Size: Large)   Pulse 71   Temp 98.1 F (36.7 C) (Oral)   Resp 18   Ht 5\' 7"  (1.702 m)   Wt 231 lb 1.6 oz (104.8 kg)   SpO2 100%   BMI 36.20 kg/m  Wt Readings from Last 3 Encounters:  06/21/21 231 lb 1.6 oz (104.8 kg)  06/11/21 226 lb (102.5 kg)  03/13/21 228 lb 14.4 oz (103.8 kg)    BP Readings from Last 3 Encounters:  06/21/21 121/68  06/11/21 (!) 147/102  03/13/21 (!) 148/100   General: well appearing, no distress Cardiac: RRR, no LE edema Pulm: breathing comfortably on room air. Lungs clear.  Health Maintenance:   Health  Maintenance  Topic Date Due   COVID-19 Vaccine (3 - Booster for Pfizer series) 05/30/2020   INFLUENZA VACCINE  12/20/2021 (Originally 04/22/2021)   PAP SMEAR-Modifier  03/13/2024   TETANUS/TDAP  03/05/2031   Hepatitis C Screening  Completed   HIV Screening  Completed   HPV VACCINES  Aged Out    Assessment & Plan:   Problem List Items Addressed This Visit       Cardiovascular and Mediastinum   Resistant hypertension    HYPERTENSION FOLLOW-UP: Current medications: amlodipine 10mg , hctz 25mg  daily, lisinopril 40mg  daily Med Adherence: [x]  Yes    []  No Medication side effects: []  Yes    [x]  No Interm hx: renin-aldosterone ratio was equivocal. Sleep study never performed.   Assessment: blood pressure is at goal in the office today. BP Readings from Last 3 Encounters:  06/21/21 121/68  06/11/21 (!) 147/102  03/13/21 (!) 148/100  Plan Since her blood pressure is stable on her current therapy, we will transition to Tribenzor (olmesartan-amlodipine-hctz) 40-10-25mg  daily to reduce pill burden Follow up January 2023 with me       Relevant Medications   Olmesartan-amLODIPine-HCTZ 40-10-25 MG TABS    Return in about 3 months (around 09/25/2021).   Pt discussed with Dr. , MD Internal Medicine Resident PGY-3 [facility] Res[facility] 06/21/2021 3:42  PM

## 2021-06-21 NOTE — Assessment & Plan Note (Addendum)
HYPERTENSION FOLLOW-UP: Current medications: amlodipine 10mg , hctz 25mg  daily, lisinopril 40mg  daily Med Adherence: [x]  Yes    []  No Medication side effects: []  Yes    [x]  No Interm hx: renin-aldosterone ratio was equivocal. Sleep study never performed.   Assessment: blood pressure is at goal in the office today. BP Readings from Last 3 Encounters:  06/21/21 121/68  06/11/21 (!) 147/102  03/13/21 (!) 148/100   Plan  Since her blood pressure is stable on her current therapy, we will transition to Tribenzor (olmesartan-amlodipine-hctz) 40-10-25mg  daily to reduce pill burden  Follow up January 2023 with me

## 2021-06-22 NOTE — Progress Notes (Signed)
Internal Medicine Clinic Attending  Case discussed with Dr. Christian  At the time of the visit.  We reviewed the resident's history and exam and pertinent patient test results.  I agree with the assessment, diagnosis, and plan of care documented in the resident's note.  

## 2021-06-27 NOTE — Progress Notes (Signed)
Chart reviewed for nurse visit. Agree with plan of care.   Yailine Ballard Lorraine, CNM 06/27/2021 5:24 PM   

## 2021-08-27 ENCOUNTER — Encounter: Payer: Self-pay | Admitting: Internal Medicine

## 2021-08-27 ENCOUNTER — Ambulatory Visit (INDEPENDENT_AMBULATORY_CARE_PROVIDER_SITE_OTHER): Payer: Medicaid Other | Admitting: Internal Medicine

## 2021-08-27 DIAGNOSIS — H02843 Edema of right eye, unspecified eyelid: Secondary | ICD-10-CM

## 2021-08-27 NOTE — Progress Notes (Signed)
Patient notes 3-day history of swelling of the right upper eyelid associated with discharge and itching/pain.  Denies fever or pain with eye movement.   Differential includes blepharitis, preseptal cellulitis, conjunctivitis. I recommended an in person evaluation for further evaluation which she is agreeable to.  She will present to the office tomorrow at 3 PM.

## 2021-08-28 ENCOUNTER — Encounter: Payer: Self-pay | Admitting: Internal Medicine

## 2021-08-28 ENCOUNTER — Ambulatory Visit (INDEPENDENT_AMBULATORY_CARE_PROVIDER_SITE_OTHER): Payer: Medicaid Other | Admitting: Internal Medicine

## 2021-08-28 ENCOUNTER — Other Ambulatory Visit: Payer: Self-pay

## 2021-08-28 VITALS — BP 133/95 | HR 69 | Temp 98.3°F | Ht 67.0 in | Wt 229.3 lb

## 2021-08-28 DIAGNOSIS — I10 Essential (primary) hypertension: Secondary | ICD-10-CM

## 2021-08-28 DIAGNOSIS — R7303 Prediabetes: Secondary | ICD-10-CM | POA: Diagnosis not present

## 2021-08-28 DIAGNOSIS — H5711 Ocular pain, right eye: Secondary | ICD-10-CM | POA: Diagnosis not present

## 2021-08-28 LAB — POCT GLYCOSYLATED HEMOGLOBIN (HGB A1C): Hemoglobin A1C: 5.8 % — AB (ref 4.0–5.6)

## 2021-08-28 LAB — GLUCOSE, CAPILLARY: Glucose-Capillary: 104 mg/dL — ABNORMAL HIGH (ref 70–99)

## 2021-08-28 NOTE — Assessment & Plan Note (Addendum)
HYPERTENSION FOLLOW-UP: Current medications: Olmesartan-amlodipine-HCTZ 40-10- 25 mg daily Med Adherence: [x]  Yes    []  No  Assessment: blood pressure is slightly above goal at her visit today although she is slightly uncomfortable from the eye issues noted in a separate problem.  I am not inclined to change her therapy at this time. BP Readings from Last 3 Encounters:  08/28/21 (!) 133/95  06/21/21 121/68  06/11/21 (!) 147/102   Plan  Continue Tribenzor (olmesartan-amlodipine-HCTZ 40-10-20 5 mg daily)  Follow-up in 3 months.  Will consider discussing a sleep study at that time.  If blood pressure is still above goal at her next visit, I would consider the addition of spironolactone although this may decrease is due to pill burden as it has in the past

## 2021-08-28 NOTE — Progress Notes (Signed)
Office Visit   Patient ID: Raven Webb, female    DOB: Oct 17, 1985, 35 y.o.   MRN: [id number]   PCP: Elige Radon, MD   Subjective:  CC: Follow-up (FOLLOW UP ON RIGHT EYE PAIN (no pain at present, has taken something) REQUESTING EYE REFERRAL.)   Raven Webb is a 35 y.o. year old female who presents for the above medical conditions. Please refer to problem based charting for assessment and plan.  Objective:   BP (!) 133/95 (BP Location: Right Arm, Cuff Size: Normal)   Pulse 69   Temp 98.3 F (36.8 C) (Oral)   Ht 5\' 7"  (1.702 m)   Wt 229 lb 4.8 oz (104 kg)   SpO2 100%   BMI 35.91 kg/m  BP Readings from Last 3 Encounters:  08/28/21 (!) 133/95  06/21/21 121/68  06/11/21 (!) 147/102   General: Well appearing female in no distress Cardiac: Heart regular rate and rhythm Pulm: Breathing comfortably on room air HEENT: No facial trauma, no periorbital edema bilaterally.  No conjunctival injection.  Small open lesion present medial to the medial canthus without drainage.  Mild tenderness to palpation over the site.  No crepitus.  No palpable fluid collection.  EOMs are fully intact and do not appear to cause significant discomfort.  Pupils are equal round and reactive to light bilaterally.  Gentle pressure over the orbits do not elicit significant pain and do not feel tense.  No pain on palpation over the frontal or maxillary sinuses.  Assessment & Plan:   Problem List Items Addressed This Visit       Cardiovascular and Mediastinum   Resistant hypertension (Chronic)    HYPERTENSION FOLLOW-UP: Current medications: Olmesartan-amlodipine-HCTZ 40-10- 25 mg daily Med Adherence: [x]  Yes    []  No  Assessment: blood pressure is slightly above goal at her visit today although she is slightly uncomfortable from the eye issues noted in a separate problem.  I am not inclined to change her therapy at this time. BP Readings from Last 3 Encounters:  08/28/21 (!) 133/95  06/21/21 121/68   06/11/21 (!) 147/102  Plan Continue Tribenzor (olmesartan-amlodipine-HCTZ 40-10-20 5 mg daily) Follow-up in 3 months.  Will consider discussing a sleep study at that time.  If blood pressure is still above goal at her next visit, I would consider the addition of spironolactone although this may decrease is due to pill burden as it has in the past         Other   Prediabetes - Primary (Chronic)    A1C stable at 5.8. No change in management at this time. Recheck in 6 mo.      Relevant Orders   POC Hbg A1C (Completed)   Acute right eye pain    HPI She presents for evaluation of 3-day history of right eye symptoms.  She developed right eyelid swelling on Saturday.  Later on she developed an open lesion near the medial canthus which she notes started draining a thick white material yesterday with a little bit of blood present.  She reports mild discomfort with eye movement although this was not evident on exam.  She notes that the swelling of her right eyelid has decreased since yesterday.  She has been taking ibuprofen pretty consistently over the past couple days for the right eye tenderness. She denies fever, chills, change in vision, headache, recent orbital trauma/surgery, or recent URI.  No similar symptoms in close contacts.  No similar episodes in the past. Assessment Unclear  etiology of her symptoms.  May consider lacrimal duct occlusion. Low suspicion for orbital cellulitis or acute glaucoma based on history and exam.  Exam is not consistent with conjunctivitis. Plan Urgent referral placed to ophthalmology.  Hopeful that she can be seen in the next 2 to 3 days Continue supportive measures including warm compresses Return precautions discussed      Relevant Orders   Ambulatory referral to Ophthalmology     Return in about 3 months (around 11/26/2021).   Pt discussed with Dr. Rejeana Brock, MD Internal Medicine Resident PGY-3 [facility] 08/28/2021 4:49 PM

## 2021-08-28 NOTE — Assessment & Plan Note (Signed)
A1C stable at 5.8. No change in management at this time. Recheck in 6 mo.

## 2021-08-28 NOTE — Assessment & Plan Note (Signed)
HPI She presents for evaluation of 3-day history of right eye symptoms.  She developed right eyelid swelling on Saturday.  Later on she developed an open lesion near the medial canthus which she notes started draining a thick white material yesterday with a little bit of blood present.  She reports mild discomfort with eye movement although this was not evident on exam.  She notes that the swelling of her right eyelid has decreased since yesterday.  She has been taking ibuprofen pretty consistently over the past couple days for the right eye tenderness. She denies fever, chills, change in vision, headache, recent orbital trauma/surgery, or recent URI.  No similar symptoms in close contacts.  No similar episodes in the past. Assessment Unclear etiology of her symptoms.  May consider lacrimal duct occlusion. Low suspicion for orbital cellulitis or acute glaucoma based on history and exam.  Exam is not consistent with conjunctivitis. Plan  Urgent referral placed to ophthalmology.  Hopeful that she can be seen in the next 2 to 3 days  Continue supportive measures including warm compresses  Return precautions discussed

## 2021-08-28 NOTE — Patient Instructions (Addendum)
Raven Webb, it was a pleasure seeing you today!  Today we discussed:  Right eye swelling/pain:  -Continue warm compresses and as needed ibuprofen -I have placed a referral to ophthalmology. Our office will work to get you seen by them in the next couple of days.  -If you develop worsening pain, facial swelling, a fever, or any other change in symptoms, please seek immediate evaluation in the ED or urgent care.   Follow-up: 3 months   Please make sure to arrive 15 minutes prior to your next appointment. If you arrive late, you may be asked to reschedule.   We look forward to seeing you next time. Please call our clinic at [phone] if you have any questions or concerns. The best time to call is Monday-Friday from 9am-4pm, but there is someone available 24/7. If after hours or the weekend, call the main hospital number and ask for the Internal Medicine Resident On-Call. If you need medication refills, please notify your pharmacy one week in advance and they will send [place] a request.  Thank you for letting us take part in your care. Wishing you the best!

## 2021-08-29 NOTE — Progress Notes (Signed)
Internal Medicine Clinic Attending  Case discussed with Dr. Ephriam Knuckles  At the time of the visit.  We discussed presentation and agreed that clinic evaluation was necessary for further decision making.

## 2021-08-29 NOTE — Progress Notes (Signed)
Internal Medicine Clinic Attending  I saw and evaluated the patient.  I personally confirmed the key portions of the history and exam documented by Dr. Christian   and I reviewed pertinent patient test results.  The assessment, diagnosis, and plan were formulated together and I agree with the documentation in the resident's note.  

## 2021-08-30 ENCOUNTER — Ambulatory Visit: Payer: Medicaid Other

## 2021-09-02 ENCOUNTER — Ambulatory Visit (INDEPENDENT_AMBULATORY_CARE_PROVIDER_SITE_OTHER): Payer: Medicaid Other

## 2021-09-02 ENCOUNTER — Other Ambulatory Visit: Payer: Self-pay

## 2021-09-02 VITALS — BP 144/103 | HR 75 | Wt 227.4 lb

## 2021-09-02 DIAGNOSIS — Z3042 Encounter for surveillance of injectable contraceptive: Secondary | ICD-10-CM | POA: Diagnosis not present

## 2021-09-02 MED ORDER — MEDROXYPROGESTERONE ACETATE 150 MG/ML IM SUSP
150.0000 mg | Freq: Once | INTRAMUSCULAR | Status: AC
  Administered 2021-09-02: 150 mg via INTRAMUSCULAR

## 2021-09-02 NOTE — Progress Notes (Signed)
Rich Fuchs here for Depo-Provera Injection. Injection administered without complication. Patient will return in 3 months for next injection between 11/18/21 and 12/02/21. Next annual visit due June 2023.   BP of 144/103 today. Pt has history of chronic hypertension managed by [facility]. Patient reports she is waiting for pharmacy to fill one of her BP medications so that may be why BP elevated today. Will follow up with PCP.  Marjo Bicker, RN 09/02/2021  3:11 PM

## 2021-09-02 NOTE — Progress Notes (Signed)
Chart reviewed for nurse visit. Agree with plan of care.   Warner Mccreedy, MD 09/02/2021 6:17 PM

## 2021-09-16 ENCOUNTER — Other Ambulatory Visit: Payer: Self-pay | Admitting: Internal Medicine

## 2021-10-19 ENCOUNTER — Other Ambulatory Visit: Payer: Self-pay

## 2021-10-19 ENCOUNTER — Ambulatory Visit (HOSPITAL_COMMUNITY)
Admission: EM | Admit: 2021-10-19 | Discharge: 2021-10-19 | Disposition: A | Discharge status: Home / Self Care | Payer: Medicaid Other | Attending: Emergency Medicine | Admitting: Emergency Medicine

## 2021-10-19 DIAGNOSIS — H1031 Unspecified acute conjunctivitis, right eye: Secondary | ICD-10-CM

## 2021-10-19 MED ORDER — GENTAMICIN SULFATE 0.3 % OP SOLN: 1.0000 [drp] | OPHTHALMIC | 0 refills | Status: AC

## 2021-10-19 NOTE — Discharge Instructions (Addendum)
Use eye drops as prescribed and to completion Dispose of old contacts and wear glasses until you have finished course of antibiotic eye drops Wash pillow cases, wash hands regularly with soap and water, avoid touching your face and eyes, wash door handles, light switches, remotes and other objects you frequently touch Return or follow up with PCP if symptoms persists such as fever, chills, redness, swelling, eye pain, painful eye movements, vision changes, etc... 

## 2021-10-19 NOTE — ED Provider Notes (Signed)
[facility]   [id number] 10/19/21 Arrival Time: 1021  Chief Complaint  Patient presents with   Conjunctivitis      SUBJECTIVE:  Raven Webb is a 36 y.o. female who presents w to the urgent care for complaint of right drainage and swelling that started this morning.  Denies a precipitating event, trauma, or close contacts with similar symptoms.  Has not tried any OTC medication denies alleviating or aggravating factors.  Denies similar symptoms in the past.  Denies fever, chills, nausea, vomiting, painful eye movements, halos, discharge, itching, vision changes, double vision, FB sensation, periorbital erythema.     Denies contact lens use.    ROS: As per HPI.  All other pertinent ROS negative.     Past Medical History:  Diagnosis Date   GERD (gastroesophageal reflux disease)    Hypertension    IBS (irritable bowel syndrome)    UTI (lower urinary tract infection)    Past Surgical History:  Procedure Laterality Date   WISDOM TOOTH EXTRACTION     No Known Allergies No current facility-administered medications on file prior to encounter.   Current Outpatient Medications on File Prior to Encounter  Medication Sig Dispense Refill   albuterol (PROVENTIL HFA) 108 (90 Base) MCG/ACT inhaler Inhale 1-2 puffs into the lungs every 6 (six) hours as needed for wheezing or shortness of breath. (Patient not taking: Reported on 09/02/2021) 18 g 0   amLODipine (NORVASC) 10 MG tablet TAKE 1 TABLET (10 MG TOTAL) BY MOUTH DAILY. 90 tablet 0   fluticasone (FLONASE) 50 MCG/ACT nasal spray PLACE 1 SPRAY INTO BOTH NOSTRILS DAILY FOR 20 DAYS. 16 g 1   hydrochlorothiazide (HYDRODIURIL) 25 MG tablet Take 25 mg by mouth daily.     lisinopril (ZESTRIL) 40 MG tablet TAKE 1 TABLET (40 MG TOTAL) BY MOUTH DAILY. 90 tablet 0   metoCLOPramide (REGLAN) 10 MG tablet Take 1 tablet (10 mg total) by mouth every 6 (six) hours. 60 tablet 0   Social History   Socioeconomic History   Marital status:  Single    Spouse name: Not on file   Number of children: Not on file   Years of education: Not on file   Highest education level: Not on file  Occupational History   Not on file  Tobacco Use   Smoking status: Never   Smokeless tobacco: Never  Vaping Use   Vaping Use: Never used  Substance and Sexual Activity   Alcohol use: No    Alcohol/week: 0.0 standard drinks   Drug use: No   Sexual activity: Not on file  Other Topics Concern   Not on file  Social History Narrative   Not on file   Social Determinants of Health   Financial Resource Strain: Not on file  Food Insecurity: No Food Insecurity   Worried About Running Out of Food in the Last Year: Never true   Ran Out of Food in the Last Year: Never true  Transportation Needs: No Transportation Needs   Lack of Transportation (Medical): No   Lack of Transportation (Non-Medical): No  Physical Activity: Not on file  Stress: Not on file  Social Connections: Not on file  Intimate Partner Violence: Not on file   Family History  Problem Relation Age of Onset   Hypertension Mother    Diabetes Mother    Hypertension Father    Hypertension Sister    Diabetes Sister    Hypertension Maternal Uncle    Kidney disease Maternal Uncle  OBJECTIVE:    Visual Acuity  Right Eye Distance:   Left Eye Distance:   Bilateral Distance:    Right Eye Near:   Left Eye Near:    Bilateral Near:      Vitals:   10/19/21 1151  BP: (!) 162/122  Pulse: 67  Resp: 16  Temp: 98.1 F (36.7 C)  TempSrc: Oral  SpO2: 100%    Physical Exam Vitals and nursing note reviewed.  Constitutional:      General: She is not in acute distress.    Appearance: Normal appearance. She is normal weight. She is not ill-appearing, toxic-appearing or diaphoretic.  HENT:     Head: Normocephalic.  Eyes:     General: Lids are normal. Vision grossly intact. Gaze aligned appropriately.        Right eye: Discharge present.     Conjunctiva/sclera:     Right  eye: Right conjunctiva is injected.  Cardiovascular:     Rate and Rhythm: Normal rate and regular rhythm.     Pulses: Normal pulses.     Heart sounds: Normal heart sounds. No murmur heard.   No friction rub. No gallop.  Pulmonary:     Effort: Pulmonary effort is normal. No respiratory distress.     Breath sounds: Normal breath sounds. No stridor. No wheezing, rhonchi or rales.  Chest:     Chest wall: No tenderness.  Neurological:     Mental Status: She is alert and oriented to person, place, and time.      ASSESSMENT & PLAN:  1. Acute bacterial conjunctivitis of right eye     Meds ordered this encounter  Medications   gentamicin (GARAMYCIN) 0.3 % ophthalmic solution    Sig: Place 1 drop into the right eye every 4 (four) hours for 7 days.    Dispense:  5 mL    Refill:  0    Discharge instructions  Use eye drops as prescribed and to completion Dispose of old contacts and wear glasses until you have finished course of antibiotic eye drops Wash pillow cases, wash hands regularly with soap and water, avoid touching your face and eyes, wash door handles, light switches, remotes and other objects you frequently touch Return or follow up with PCP if symptoms persists such as fever, chills, redness, swelling, eye pain, painful eye movements, vision changes, etc...  Reviewed expectations re: course of current medical issues. Questions answered. Outlined signs and symptoms indicating need for more acute intervention. Patient verbalized understanding. After Visit Summary given.    Durward Parcel, FNP 10/19/21 [phone]

## 2021-10-19 NOTE — ED Triage Notes (Signed)
Pt presents to the office for right eye drainage and swelling that started this morning.

## 2021-10-28 ENCOUNTER — Encounter: Payer: Self-pay | Admitting: Internal Medicine

## 2021-10-30 ENCOUNTER — Other Ambulatory Visit: Payer: Self-pay | Admitting: Internal Medicine

## 2021-10-30 MED ORDER — METOCLOPRAMIDE HCL 10 MG PO TABS: 10.0000 mg | ORAL_TABLET | Freq: Four times a day (QID) | ORAL | 0 refills | Status: DC

## 2021-11-11 ENCOUNTER — Encounter: Payer: Self-pay | Admitting: Internal Medicine

## 2021-11-19 ENCOUNTER — Ambulatory Visit: Payer: Medicaid Other

## 2021-11-21 ENCOUNTER — Ambulatory Visit (INDEPENDENT_AMBULATORY_CARE_PROVIDER_SITE_OTHER): Payer: Medicaid Other

## 2021-11-21 ENCOUNTER — Other Ambulatory Visit: Payer: Self-pay

## 2021-11-21 VITALS — BP 145/92 | HR 98 | Ht 67.0 in | Wt 229.0 lb

## 2021-11-21 DIAGNOSIS — Z3042 Encounter for surveillance of injectable contraceptive: Secondary | ICD-10-CM

## 2021-11-21 MED ORDER — MEDROXYPROGESTERONE ACETATE 150 MG/ML IM SUSP
150.0000 mg | Freq: Once | INTRAMUSCULAR | Status: AC
  Administered 2021-11-21: 150 mg via INTRAMUSCULAR

## 2021-11-21 NOTE — Progress Notes (Signed)
Raven Webb here for Depo-Provera Injection. Injection administered without complication. Patient will return in 3 months for next injection between May 18 and June 1,2023. Next annual visit due June 2023.  ?Pt has elevated BP today, but is taking BP meds and has appt with PCP next week for follow up.  ? ?Isabell Jarvis, RN ?11/21/2021  1:25 PM ? ?

## 2021-11-25 ENCOUNTER — Encounter (HOSPITAL_COMMUNITY): Payer: Self-pay

## 2021-11-25 ENCOUNTER — Encounter: Payer: Medicaid Other | Admitting: Internal Medicine

## 2021-11-25 ENCOUNTER — Other Ambulatory Visit: Payer: Self-pay

## 2021-11-25 ENCOUNTER — Ambulatory Visit (HOSPITAL_COMMUNITY)
Admission: EM | Admit: 2021-11-25 | Discharge: 2021-11-25 | Disposition: A | Discharge status: Home / Self Care | Payer: Medicaid Other | Attending: Sports Medicine | Admitting: Sports Medicine

## 2021-11-25 DIAGNOSIS — M542 Cervicalgia: Secondary | ICD-10-CM

## 2021-11-25 DIAGNOSIS — S161XXA Strain of muscle, fascia and tendon at neck level, initial encounter: Secondary | ICD-10-CM | POA: Diagnosis not present

## 2021-11-25 MED ORDER — METHOCARBAMOL 500 MG PO TABS: 500.0000 mg | ORAL_TABLET | Freq: Three times a day (TID) | ORAL | 0 refills | Status: DC | PRN
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
## 2021-11-25 NOTE — Discharge Instructions (Addendum)
-   Apply ice or heat to the neck for the next 2 days, after that heat will be your best friend for the neck ?-Once your acute pain is feeling better, begin some of the stretches and exercises for the neck ? ?-If your headache becomes worse, or you have visual changes, you would need to present back to the urgent care or ED ?
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
## 2021-11-26 ENCOUNTER — Encounter: Payer: Medicaid Other | Admitting: Internal Medicine

## 2021-12-24 ENCOUNTER — Encounter: Payer: Medicaid Other | Admitting: Internal Medicine

## 2022-01-11 ENCOUNTER — Other Ambulatory Visit: Payer: Self-pay | Admitting: Internal Medicine

## 2022-01-20 ENCOUNTER — Encounter: Payer: Self-pay | Admitting: Internal Medicine

## 2022-01-20 ENCOUNTER — Ambulatory Visit: Payer: Medicaid Other | Admitting: Internal Medicine

## 2022-01-20 ENCOUNTER — Other Ambulatory Visit: Payer: Self-pay

## 2022-01-20 VITALS — BP 112/71 | HR 74 | Temp 99.4°F | Resp 20 | Ht 67.0 in | Wt 232.4 lb

## 2022-01-20 DIAGNOSIS — R7303 Prediabetes: Secondary | ICD-10-CM

## 2022-01-20 DIAGNOSIS — H5711 Ocular pain, right eye: Secondary | ICD-10-CM

## 2022-01-20 DIAGNOSIS — E119 Type 2 diabetes mellitus without complications: Secondary | ICD-10-CM | POA: Diagnosis not present

## 2022-01-20 DIAGNOSIS — I10 Essential (primary) hypertension: Secondary | ICD-10-CM | POA: Diagnosis not present

## 2022-01-20 LAB — POCT GLYCOSYLATED HEMOGLOBIN (HGB A1C): Hemoglobin A1C: 6.5 % — AB (ref 4.0–5.6)

## 2022-01-20 LAB — GLUCOSE, CAPILLARY: Glucose-Capillary: 113 mg/dL — ABNORMAL HIGH (ref 70–99)

## 2022-01-20 MED ORDER — METFORMIN HCL 500 MG PO TABS: 1000.0000 mg | ORAL_TABLET | Freq: Two times a day (BID) | ORAL | 1 refills | Status: DC

## 2022-01-20 NOTE — Assessment & Plan Note (Signed)
Resolved

## 2022-01-20 NOTE — Assessment & Plan Note (Addendum)
HYPERTENSION FOLLOW-UP:  ?Current medications: Amlodipine 10 mg daily, hydrochlorothiazide 25 mg daily, lisinopril 40 mg daily ?Med Adherence: [x]  Yes    []  No ?Blood pressures at goal in the office today-- 112/71 ?Renal function/electrolytes stable on CMP today. ?Notes: We attempted to transition her to Tribenzor at her last appointment.  She notes that she developed some lightheadedness with this despite the doses being the same.  Symptoms resolved after being transition back to the individual pills.  ?Plan ?? Continue current management ?? Follow up 3 mo ? ?
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
## 2022-01-20 NOTE — Assessment & Plan Note (Addendum)
Previously diagnosed with prediabetes.  Obtained a 11-month A1c check today which revealed A1c increase from 5.8 to 6.5.  Discussed diabetes diagnosis with patient, reviewing the basic physiology, treatment (including pharmaceutical and lifestyle), and complications of uncontrolled diabetes. ?? Start metformin with titration.  Reviewed adverse medication effects with patient ?? If unable to tolerate adverse effects, consider transition to GLP-1 for dual glycemic control/weight loss benefit ?? Referred to [name] for ongoing diabetic education and nutrition ?? Follow-up in 3 months with repeat A1c ?? Refer to ophthalmology for retinopathy screening ?? Perform foot exam ?? Obtain urine microalbumin/creatinine ?

## 2022-01-20 NOTE — Patient Instructions (Addendum)
[name], it was a pleasure seeing you today! ? ?Today we discussed: ? ?Hypertension: Your blood pressure looks great in the office today. Continue taking lisinopril, HCTZ, and amlodipine ? ?Diabetes: Your A1C has increased from 5.8 to 6.5. This puts you in the diabetic range.  ?-start metformin. I will have you slowly increase the dose: Take 1 pill once every morning for 1 week, then increase to 1 pill twice a day for 1 week, then increase to 2 pills in the morning and 1 at night, then 2 pills twice a day. Do not increase the dose if you are having side effects at the lower dose. If your side effects persistent, please give me a call so we can chat about alternative medications.  ?-I will have you see [name], our diabetic educator here in the clinic who can talk with you more about diet and diabetes. ? ? ?Unfortunately, I will be graduating in June. You will be assigned a new PCP through our clinic who you will hopefully see at your next appointment in 3 months.  ? ?Thank you so much for placing your trust in me as your PCP and it has been an absolute pleasure getting to know you over the past few years. I wish you only but the very best! ? ?Follow-up: 3 months  ? ?Please make sure to arrive 15 minutes prior to your next appointment. If you arrive late, you may be asked to reschedule.  ? ?We look forward to seeing you next time. Please call our clinic at [phone] if you have any questions or concerns. The best time to call is Monday-Friday from 9am-4pm, but there is someone available 24/7. If after hours or the weekend, call the main hospital number and ask for the Internal Medicine Resident On-Call. If you need medication refills, please notify your pharmacy one week in advance and they will send [place] a request. ? ?Thank you for letting us take part in your care. Wishing you the best! ? ? ?

## 2022-01-21 LAB — CMP14 + ANION GAP
ALT: 11 IU/L (ref 0–32)
AST: 16 IU/L (ref 0–40)
Albumin/Globulin Ratio: 1.3 (ref 1.2–2.2)
Albumin: 4.1 g/dL (ref 3.8–4.8)
Alkaline Phosphatase: 118 IU/L (ref 44–121)
Anion Gap: 13 mmol/L (ref 10.0–18.0)
BUN/Creatinine Ratio: 15 (ref 9–23)
BUN: 16 mg/dL (ref 6–20)
Bilirubin Total: 0.3 mg/dL (ref 0.0–1.2)
CO2: 24 mmol/L (ref 20–29)
Calcium: 9.7 mg/dL (ref 8.7–10.2)
Chloride: 102 mmol/L (ref 96–106)
Creatinine, Ser: 1.04 mg/dL — ABNORMAL HIGH (ref 0.57–1.00)
Globulin, Total: 3.2 g/dL (ref 1.5–4.5)
Glucose: 110 mg/dL — ABNORMAL HIGH (ref 70–99)
Potassium: 3.7 mmol/L (ref 3.5–5.2)
Sodium: 139 mmol/L (ref 134–144)
Total Protein: 7.3 g/dL (ref 6.0–8.5)
eGFR: 73 mL/min/{1.73_m2} (ref >59)

## 2022-01-21 NOTE — Progress Notes (Signed)
Your labs from your visit with me look stable. Please let me know if you have any questions or concerns. ? ?Mitzi Hansen, MD

## 2022-01-22 ENCOUNTER — Other Ambulatory Visit: Payer: Self-pay | Admitting: Internal Medicine

## 2022-01-22 ENCOUNTER — Encounter: Payer: Self-pay | Admitting: Internal Medicine

## 2022-01-22 DIAGNOSIS — E119 Type 2 diabetes mellitus without complications: Secondary | ICD-10-CM

## 2022-01-22 MED ORDER — ACCU-CHEK GUIDE VI STRP: ORAL_STRIP | 11 refills | Status: DC

## 2022-01-22 MED ORDER — ACCU-CHEK GUIDE W/DEVICE KIT: 1.0000 | PACK | Freq: Every day | 2 refills | Status: DC

## 2022-01-22 MED ORDER — ACCU-CHEK SOFTCLIX LANCETS MISC: 12 refills | Status: DC

## 2022-01-23 ENCOUNTER — Encounter: Payer: Self-pay | Admitting: Internal Medicine

## 2022-01-24 NOTE — Progress Notes (Signed)
Internal Medicine Clinic Attending  Case discussed with Dr. Christian  At the time of the visit.  We reviewed the resident's history and exam and pertinent patient test results.  I agree with the assessment, diagnosis, and plan of care documented in the resident's note.  

## 2022-01-31 ENCOUNTER — Encounter: Payer: Self-pay | Admitting: Internal Medicine

## 2022-02-07 ENCOUNTER — Ambulatory Visit (INDEPENDENT_AMBULATORY_CARE_PROVIDER_SITE_OTHER): Payer: Medicaid Other | Admitting: *Deleted

## 2022-02-07 VITALS — BP 131/86 | HR 69 | Ht 67.0 in | Wt 231.0 lb

## 2022-02-07 DIAGNOSIS — Z3042 Encounter for surveillance of injectable contraceptive: Secondary | ICD-10-CM

## 2022-02-07 MED ORDER — MEDROXYPROGESTERONE ACETATE 150 MG/ML IM SUSP
150.0000 mg | Freq: Once | INTRAMUSCULAR | Status: AC
  Administered 2022-02-07: 150 mg via INTRAMUSCULAR

## 2022-02-07 NOTE — Progress Notes (Signed)
Depo provera 150 mg IM administered as scheduled.  Pt tolerated well. Next injection due 8/4-8/18. Next Annual Gyn exam due 02/2022.  Last Pap 03/13/21 - negative.

## 2022-02-12 ENCOUNTER — Other Ambulatory Visit: Payer: Self-pay | Admitting: Internal Medicine

## 2022-02-12 ENCOUNTER — Encounter: Payer: Self-pay | Admitting: Dietician

## 2022-02-12 ENCOUNTER — Ambulatory Visit: Payer: Medicaid Other | Admitting: Dietician

## 2022-02-12 DIAGNOSIS — E119 Type 2 diabetes mellitus without complications: Secondary | ICD-10-CM

## 2022-02-12 NOTE — Progress Notes (Signed)
This is a telephone encounter between Raven Webb  and Butch Penny Jakiya Bookbinder  on 02/12/2022 for Diabetes Self Management Education & Support.  The visit was conducted with the patient located at home and Debera Lat at [facility]. The patient's identity was confirmed using their DOB and current address. The patient has consented to being evaluated through a telephone encounter and understands the associated risks / benefits (allows the patient to remain at home, decreasing exposure to coronavirus). I personally spent 45 minutes on medical nutrition therapy discussion.    Patient and/or legal guardian consented to telephone visit: yes   Diabetes Self-Management Education  Visit Type: First/Initial  Appt. Start Time: 1030 Appt. End Time: 1115  02/12/2022  [name], identified by name and date of birth, is a 36 y.o. female with a diagnosis of Diabetes: Type 2.   ASSESSMENT  Estimated body mass index is 36.18 kg/m as calculated from the following:   Height as of 02/07/22: 5\' 7"  (1.702 m).   Weight as of 02/07/22: 231 lb (104.8 kg).  Wt Readings from Last 10 Encounters:  02/07/22 231 lb (104.8 kg)  01/20/22 232 lb 6.4 oz (105.4 kg)  11/21/21 229 lb (103.9 kg)  09/02/21 227 lb 6.4 oz (103.1 kg)  08/28/21 229 lb 4.8 oz (104 kg)  06/21/21 231 lb 1.6 oz (104.8 kg)  06/11/21 226 lb (102.5 kg)  03/13/21 228 lb 14.4 oz (103.8 kg)  03/04/21 231 lb 4.8 oz (104.9 kg)  12/26/20 224 lb 3.2 oz (101.7 kg)   Lab Results  Component Value Date   HGBA1C 6.5 (A) 01/20/2022   HGBA1C 5.8 (A) 08/28/2021   HGBA1C 5.6 03/04/2021   HGBA1C 5.7 (A) 08/08/2019    BP Readings from Last 3 Encounters:  02/07/22 131/86  01/20/22 112/71  11/25/21 (!) 140/94      Diabetes Self-Management Education - 02/12/22 1000       Visit Information   Visit Type First/Initial      Initial Visit   Diabetes Type Type 2    Date Diagnosed 01/2022    Are you currently following a meal plan? Yes   trying to learn what to  eat and what not to eat what makes it go up and doesn't sugar skittle zero sugar   Are you taking your medications as prescribed? Yes   2 pills 500mg  twice a day metformin- has to eat with it. causing diarhea every day.     Psychosocial Assessment   Patient Belief/Attitude about Diabetes Motivated to manage diabetes   denies fear, sees mom   What is the hardest part about your diabetes right now, causing you the most concern, or is the most worrisome to you about your diabetes?   Checking blood sugar   pricking her finger,running out of test strips   Self-care barriers None    Self-management support Family;Doctor's office    Patient Concerns Other (comment)    Special Needs None    Preferred Learning Style Hands on;Visual;Auditory    Learning Readiness Change in progress    How often do you need to have someone help you when you read instructions, pamphlets, or other written materials from your doctor or pharmacy? 1 - Never    What is the last grade level you completed in school? 12      Pre-Education Assessment   Patient understands the diabetes disease and treatment process. Comprehends key points    Patient understands incorporating nutritional management into lifestyle. Comprehends key points  Patient undertands incorporating physical activity into lifestyle. Comprehends key points    Patient understands using medications safely. Needs Instruction    Patient understands monitoring blood glucose, interpreting and using results Needs Instruction    Patient understands prevention, detection, and treatment of acute complications. Needs Instruction    Patient understands prevention, detection, and treatment of chronic complications. Needs Instruction    Patient understands how to develop strategies to address psychosocial issues. Comprehends key points    Patient understands how to develop strategies to promote health/change behavior. Comprehends key points      Complications   Last HgB  A1C per patient/outside source 6.5 %    How often do you check your blood sugar? 1-2 times/day    Fasting Blood glucose range (mg/dL) 130-179    Postprandial Blood glucose range (mg/dL) 180-200    Number of hypoglycemic episodes per month 1    Can you tell when your blood sugar is low? Yes   jittery,shakey, dry mouth, thirsty,  tired   What do you do if your blood sugar is low? eat candy, a whole full course meal    Number of hyperglycemic episodes ( >200mg /dL): Occasional    Can you tell when your blood sugar is high? No    Have you had a dilated eye exam in the past 12 months? Yes   wnl   Have you had a dental exam in the past 12 months? Yes    Are you checking your feet? No      Dietary Intake   Breakfast skips every other day but drinks water  or 9-10 am waffle w/ sausage or cereal golden grahams or fruit loops small bowl x1    Lunch every other day at 1130 am sandwich and chips    Dinner 7 PM- salad and baked potato feta cheese, bacon bits, ranch, pepper, butter, sour cream cheddar cheese    Beverage(s) flavored water, 1 can soda once a day      Activity / Exercise   Activity / Exercise Type Light (walking / raking leaves)    How many days per week do you exercise? 2    How many minutes per day do you exercise? 10    Total minutes per week of exercise 20      Patient Education   Previous Diabetes Education No    Medications Reviewed patients medication for diabetes, action, purpose, timing of dose and side effects.      Outcomes   Expected Outcomes Demonstrated interest in learning. Expect positive outcomes    Future DMSE 4-6 wks    Program Status Not Completed             Individualized Plan for Diabetes Self-Management Training:   Learning Objective:  Patient will have a greater understanding of diabetes self-management. Patient education plan is to attend individual and/or group sessions per assessed needs and concerns.   Plan:   Patient Instructions  Thank you  for your visit today!  We talked about:   That the food we eat and how much we eat. medications and our physical activity can help our body use it's own insulin better. ( Can help control blood sugar)   Physical activity lowers blood sugar.   There is no special meal plan for diabetes. All foods can fit in a meal plan. You may choose some foods over others more often based on their effects on your health.    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   Goals to work on:   Electrical engineer about what foods increase blood sugars more and adjusting portion sizes or finding other foods you also like to substitute.   Consider increasing your walking to 30 minutes 3 days a week.  The goal for good health for all of [place] is 150 minutes moderate activity per week.  This can be done in 5 days for 30 minutes or 3 days for 50 minutes, etc...    Please feel free to call me anytime.  Butch Penny [phone]   Expected Outcomes:  Demonstrated interest in learning. Expect positive outcomes  Education material provided: Diabetes Resources  If problems or questions, patient to contact team via:  Phone  Future DSME appointment: 4-6 wks Debera Lat, RD 02/12/2022 12:18 PM.

## 2022-02-12 NOTE — Progress Notes (Addendum)
Spoke to Dr. Ephriam Knuckles via chat about her problems with metformin. Dr. Ephriam Knuckles to call patient.  CGM about to expire offered to patient and she agreed to use them. She says her phone is compatible and she knows how to use the  from her mother using them. 3 14 day sample CGMs left at front for patient to pick up.

## 2022-02-12 NOTE — Patient Instructions (Addendum)
Thank you for your visit today!  We talked about:   That the food we eat and how much we eat. medications and our physical activity can help our body use it's own insulin better. ( Can help control blood sugar)   Physical activity lowers blood sugar.   There is no special diet for diabetes. All foods can fit in a meal plan. You may choose some foods over others more often based on their effects on your health.    You are doing a good job caring for your diabetes!   Goals to work on:   Market researcher about what foods increase blood sugars more and adjusting portion sizes or finding other foods you also like to substitute.   Consider increasing your walking to 30 minutes 3 days a week.  The goal for good health for all of [place] is 150 minutes moderate activity per week.  This can be done in 5 days for 30 minutes or 3 days for 50 minutes, etc...    Please feel free to call me anytime.  [name] [phone]

## 2022-02-14 ENCOUNTER — Other Ambulatory Visit: Payer: Self-pay | Admitting: Internal Medicine

## 2022-02-14 DIAGNOSIS — E119 Type 2 diabetes mellitus without complications: Secondary | ICD-10-CM

## 2022-02-14 MED ORDER — OZEMPIC (1 MG/DOSE) 4 MG/3ML ~~LOC~~ SOPN
1.0000 mg | PEN_INJECTOR | SUBCUTANEOUS | 0 refills | Status: DC
Start: 2022-03-17 — End: 2022-05-07

## 2022-02-14 MED ORDER — OZEMPIC (0.25 OR 0.5 MG/DOSE) 2 MG/3ML ~~LOC~~ SOPN: PEN_INJECTOR | SUBCUTANEOUS | 0 refills | Status: AC

## 2022-02-14 NOTE — Assessment & Plan Note (Addendum)
Notified by [name] that Raven Webb is not tolerating metformin due to GI effects--experiencing diarrhea and nausea. I called and spoke with Raven Webb this afternoon who confirms the above. She is interested in trying Ozempic which I think is reasonably given her A1C. She feels confident on administration and use--mother and sister use insulin. Reviewed adverse medication effects. She does have chronic nausea however it is s/p cholecystectomy and not thought to be attributable to gastroparesis however I did warn her of potential worsening of nausea. She agrees to notify our office if this occurs or to let us know if she would like further instruction on administration.  --Ozempic titration sent to the pharmacy

## 2022-02-14 NOTE — Progress Notes (Signed)
Notified by [name] that Raven Webb is not tolerating metformin due to GI effects--experiencing diarrhea and nausea. I called and spoke with Brinda this afternoon who confirms the above. She is interested in trying Ozempic which I think is reasonably given her A1C. She feels confident on administration and use--mother and sister use insulin. Reviewed adverse medication effects. She does have chronic nausea however it is s/p cholecystectomy and not thought to be attributable to gastroparesis however I did warn her of potential worsening of nausea. She agrees to notify our office if this occurs or to let us know if she would like further instruction on administration.  --Ozempic titration sent to the pharmacy  Elige Radon, MD

## 2022-03-19 ENCOUNTER — Encounter: Payer: Medicaid Other | Admitting: Dietician

## 2022-04-02 ENCOUNTER — Ambulatory Visit (INDEPENDENT_AMBULATORY_CARE_PROVIDER_SITE_OTHER): Payer: Medicaid Other | Admitting: Dietician

## 2022-04-02 ENCOUNTER — Encounter: Payer: Medicaid Other | Admitting: Dietician

## 2022-04-02 ENCOUNTER — Encounter: Payer: Self-pay | Admitting: Dietician

## 2022-04-02 DIAGNOSIS — Z7985 Long-term (current) use of injectable non-insulin antidiabetic drugs: Secondary | ICD-10-CM | POA: Diagnosis not present

## 2022-04-02 DIAGNOSIS — E119 Type 2 diabetes mellitus without complications: Secondary | ICD-10-CM

## 2022-04-02 NOTE — Progress Notes (Signed)
Diabetes Self-Management Education  Visit Type: Follow-up (#1 after initial)  Appt. Start Time: 1325 Appt. End Time: 1425  04/02/2022  [name], identified by name and date of birth, is a 36 y.o. female with a diagnosis of Diabetes:  Marland Kitchen  Type 2 x 2 months ago  ASSESSMENT   Her weight has not changed. She is thinking about moving more minutes a day. She brought her meter. She states she would like to reverse her diabetes.   Self monitoring showed average of 145 for 95 checks since her diagnosis, with a range of 76-229. Her pattern is mostly flat with highest average at lunch 1230 Pm and bedtime  after 930 PM.    Weight 231 lb 3.2 oz (104.9 kg). Body mass index is 36.21 kg/m. Wt Readings from Last 10 Encounters:  04/02/22 231 lb 3.2 oz (104.9 kg)  02/07/22 231 lb (104.8 kg)  01/20/22 232 lb 6.4 oz (105.4 kg)  11/21/21 229 lb (103.9 kg)  09/02/21 227 lb 6.4 oz (103.1 kg)  08/28/21 229 lb 4.8 oz (104 kg)  06/21/21 231 lb 1.6 oz (104.8 kg)  06/11/21 226 lb (102.5 kg)  03/13/21 228 lb 14.4 oz (103.8 kg)  03/04/21 231 lb 4.8 oz (104.9 kg)    BP Readings from Last 3 Encounters:  02/07/22 131/86  01/20/22 112/71  11/25/21 (!) 140/94     Diabetes Self-Management Education - 04/02/22 1400       Visit Information   Visit Type Follow-up   #1 after initial     Psychosocial Assessment   Other persons present Parent      Complications   Fasting Blood glucose range (mg/dL) 42-595    Postprandial Blood glucose range (mg/dL) 638-756    Number of hypoglycemic episodes per month 0      Patient Education   Previous Diabetes Education Yes (please comment)    Medications Reviewed patients medication for diabetes, action, purpose, timing of dose and side effects.    Monitoring Purpose and frequency of SMBG.;Taught/discussed recording of test results and interpretation of SMBG.;Identified appropriate SMBG and/or A1C goals.    Acute complications Discussed and identified  patients' prevention, symptoms, and treatment of hyperglycemia.      Individualized Goals (developed by patient)   Nutrition --   she was not ready to set a goal     Outcomes   Expected Outcomes Demonstrated interest in learning. Expect positive outcomes    Future DMSE 4-6 wks    Program Status Not Completed   need to cover complications and goal setting     Subsequent Visit   Since your last visit have you continued or begun to take your medications as prescribed? No   afraid of semaglutide even after education bout it today   Since your last visit have you had your blood pressure checked? Yes    Is your most recent blood pressure lower, unchanged, or higher since your last visit? Higher    Since your last visit have you experienced any weight changes? No change    Since your last visit, are you checking your blood glucose at least once a day? Yes   she has checked several time a week 95 x in 2.5 months            Individualized Plan for Diabetes Self-Management Training:   Learning Objective:  Patient will have a greater understanding of diabetes self-management. Patient education plan is to attend individual and/or group sessions per assessed needs  and concerns.   Plan:   There are no Patient Instructions on file for this visit.  Expected Outcomes:  Demonstrated interest in learning. Expect positive outcomes Education material provided: A1C conversion sheet and Diabetes Resources If problems or questions, patient to contact team via:  Phone and Email Future DSME appointment: 4-6 wks Norm Parcel, RD 04/02/2022 3:04 PM.

## 2022-04-02 NOTE — Patient Instructions (Signed)
Raven Webb,  Keep up the great work of checking your blood sugars- you checked 95 times in 10 week- more than 1x/day!!!!  Please make a follow up in 4 weeks.   Please be thinking of something you want to work on to lower your blood sugars.   It can have to do with moving more or your food choices or beverages or anything that can help you to be healthier and live a healthier life.   [name] [phone]

## 2022-04-03 ENCOUNTER — Encounter: Payer: Medicaid Other | Admitting: Dietician

## 2022-04-14 ENCOUNTER — Other Ambulatory Visit: Payer: Self-pay | Admitting: Internal Medicine

## 2022-04-25 ENCOUNTER — Ambulatory Visit (INDEPENDENT_AMBULATORY_CARE_PROVIDER_SITE_OTHER): Payer: Medicaid Other | Admitting: *Deleted

## 2022-04-25 VITALS — BP 123/85 | HR 68 | Ht 67.0 in | Wt 229.2 lb

## 2022-04-25 DIAGNOSIS — Z3042 Encounter for surveillance of injectable contraceptive: Secondary | ICD-10-CM

## 2022-04-25 MED ORDER — MEDROXYPROGESTERONE ACETATE 150 MG/ML IM SUSP
150.0000 mg | Freq: Once | INTRAMUSCULAR | Status: AC
  Administered 2022-04-25: 150 mg via INTRAMUSCULAR

## 2022-04-25 NOTE — Progress Notes (Signed)
Depo Provera 150 mg IM administered as scheduled.  Pt tolerated well. Next injection due 10/20-11/3.  Pt was advised she needs annual Gyn exam and agreed to schedule appt. Last Pap 02/2021 - normal.

## 2022-05-07 ENCOUNTER — Encounter: Payer: Self-pay | Admitting: Student

## 2022-05-07 ENCOUNTER — Other Ambulatory Visit: Payer: Self-pay

## 2022-05-07 ENCOUNTER — Encounter: Payer: Self-pay | Admitting: Internal Medicine

## 2022-05-07 ENCOUNTER — Other Ambulatory Visit: Payer: Self-pay | Admitting: *Deleted

## 2022-05-07 ENCOUNTER — Ambulatory Visit (INDEPENDENT_AMBULATORY_CARE_PROVIDER_SITE_OTHER): Payer: Medicaid Other | Admitting: Internal Medicine

## 2022-05-07 VITALS — BP 135/83 | HR 72 | Temp 99.1°F | Ht 67.0 in | Wt 233.4 lb

## 2022-05-07 DIAGNOSIS — E119 Type 2 diabetes mellitus without complications: Secondary | ICD-10-CM

## 2022-05-07 DIAGNOSIS — Z7985 Long-term (current) use of injectable non-insulin antidiabetic drugs: Secondary | ICD-10-CM

## 2022-05-07 DIAGNOSIS — I10 Essential (primary) hypertension: Secondary | ICD-10-CM

## 2022-05-07 DIAGNOSIS — I1A0 Resistant hypertension: Secondary | ICD-10-CM

## 2022-05-07 LAB — POCT GLYCOSYLATED HEMOGLOBIN (HGB A1C): Hemoglobin A1C: 6.5 % — AB (ref 4.0–5.6)

## 2022-05-07 LAB — GLUCOSE, CAPILLARY: Glucose-Capillary: 128 mg/dL — ABNORMAL HIGH (ref 70–99)

## 2022-05-07 MED ORDER — OZEMPIC (1 MG/DOSE) 4 MG/3ML ~~LOC~~ SOPN: 2.0000 mg | PEN_INJECTOR | SUBCUTANEOUS | 6 refills | Status: DC

## 2022-05-07 MED ORDER — OZEMPIC (2 MG/DOSE) 8 MG/3ML ~~LOC~~ SOPN: 2.0000 mg | PEN_INJECTOR | SUBCUTANEOUS | 3 refills | Status: DC

## 2022-05-07 NOTE — Assessment & Plan Note (Signed)
BP Readings from Last 3 Encounters:  05/07/22 135/83  04/25/22 123/85  02/07/22 131/86   Blood pressure is well controlled on her current regimen.  Last BMP was checked in May and we can do this again at the next visit.  Plan: - Continue amlodipine 10 mg daily, HCTZ 25 mg daily, and lisinopril 40 mg daily - BMP at next office visit in ~3 months

## 2022-05-07 NOTE — Telephone Encounter (Signed)
Call from pt who stated wrong Ozempic rx was sent in. I talked to [facility] pharmacy who stated it should be Ozempic 2mg  pen. I talked to Dr who stated she will send rx. Pt was called and informed.

## 2022-05-07 NOTE — Progress Notes (Signed)
Internal Medicine Clinic Attending ° °Case discussed with Dr. Atway  At the time of the visit.  We reviewed the resident’s history and exam and pertinent patient test results.  I agree with the assessment, diagnosis, and plan of care documented in the resident’s note.  °

## 2022-05-07 NOTE — Addendum Note (Signed)
Addended by: Dickie La on: 05/07/2022 01:12 PM   Modules accepted: Level of Service

## 2022-05-07 NOTE — Progress Notes (Signed)
CC: diabetes  HPI:  Raven Webb is a 36 y.o. female living with a history stated below and presents today for a 3 month follow up of diabetes. Please see problem based assessment and plan for additional details.  Past Medical History:  Diagnosis Date   GERD (gastroesophageal reflux disease)    Hypertension    IBS (irritable bowel syndrome)    UTI (lower urinary tract infection)     Current Outpatient Medications on File Prior to Visit  Medication Sig Dispense Refill   Accu-Chek Softclix Lancets lancets Use to check blood sugar 3 times daily 100 each 12   albuterol (PROVENTIL HFA) 108 (90 Base) MCG/ACT inhaler Inhale 1-2 puffs into the lungs every 6 (six) hours as needed for wheezing or shortness of breath. (Patient not taking: Reported on 02/07/2022) 18 g 0   amLODipine (NORVASC) 10 MG tablet TAKE 1 TABLET (10 MG TOTAL) BY MOUTH DAILY. 90 tablet 0   Blood Glucose Monitoring Suppl (ACCU-CHEK GUIDE) w/Device KIT 1 each by Does not apply route daily at 6 (six) AM. Use to check blood sugar 3 times daily 1 kit 2   glucose blood (ACCU-CHEK GUIDE) test strip Use to check blood sugar 3 times daily 300 each 11   hydrochlorothiazide (HYDRODIURIL) 25 MG tablet TAKE 1 TABLET (25 MG TOTAL) BY MOUTH DAILY. 90 tablet 1   lisinopril (ZESTRIL) 40 MG tablet TAKE 1 TABLET (40 MG TOTAL) BY MOUTH DAILY. 90 tablet 1   metoCLOPramide (REGLAN) 10 MG tablet Take 1 tablet (10 mg total) by mouth every 6 (six) hours. 60 tablet 0   No current facility-administered medications on file prior to visit.    Family History  Problem Relation Age of Onset   Hypertension Mother    Diabetes Mother    Hypertension Father    Hypertension Sister    Diabetes Sister    Hypertension Maternal Uncle    Kidney disease Maternal Uncle     Social History   Socioeconomic History   Marital status: Single    Spouse name: Not on file   Number of children: Not on file   Years of education: Not on file   Highest  education level: Not on file  Occupational History   Not on file  Tobacco Use   Smoking status: Never   Smokeless tobacco: Never  Vaping Use   Vaping Use: Never used  Substance and Sexual Activity   Alcohol use: No    Alcohol/week: 0.0 standard drinks of alcohol   Drug use: No   Sexual activity: Not on file  Other Topics Concern   Not on file  Social History Narrative   Not on file   Social Determinants of Health   Financial Resource Strain: Not on file  Food Insecurity: No Food Insecurity (06/11/2021)   Hunger Vital Sign    Worried About Running Out of Food in the Last Year: Never true    Ran Out of Food in the Last Year: Never true  Transportation Needs: No Transportation Needs (06/11/2021)   PRAPARE - Hydrologist (Medical): No    Lack of Transportation (Non-Medical): No  Physical Activity: Not on file  Stress: Not on file  Social Connections: Not on file  Intimate Partner Violence: Not on file    Review of Systems: ROS negative except for what is noted on the assessment and plan.  Vitals:   05/07/22 0902  BP: 135/83  Pulse: 72  Temp: 99.1  F (37.3 C)  TempSrc: Oral  SpO2: 100%  Weight: 233 lb 6.4 oz (105.9 kg)  Height: 5' 7" (1.702 m)    Physical Exam: Constitutional: well-appearing female sitting in chair, in no acute distress Cardiovascular: regular rate and rhythm, no m/r/g Pulmonary/Chest: normal work of breathing on room air, lungs clear to auscultation bilaterally Abdominal: soft, non-tender, non-distended MSK: normal bulk and tone Neurological: alert & oriented x 3, no focal deficit Skin: warm and dry Psych: normal mood and behavior  Assessment & Plan:     Patient discussed with Dr. Saverio Danker  Resistant hypertension BP Readings from Last 3 Encounters:  05/07/22 135/83  04/25/22 123/85  02/07/22 131/86   Blood pressure is well controlled on her current regimen.  Last BMP was checked in May and we can do this again  at the next visit.  Plan: - Continue amlodipine 10 mg daily, HCTZ 25 mg daily, and lisinopril 40 mg daily - BMP at next office visit in ~3 months  Type 2 diabetes mellitus (HCC) A1c remains well controlled at 6.5% today. The patient has been taking Ozempic 1 mg/week, as she did not tolerate metformin due to GI side effects.  She has been tolerating the Ozempic well and has had no issues with it.  Patient overall feels well and has no acute concerns today.  Plan: - Increase Ozempic to 2 mg/week  - Follow up in 3 months; if A1c remains well controlled can space out visits to 6 months after - Diabetic foot exam performed today - Patient saw her eye doc in March; need records   Inola Lisle, D.O. [facility], PGY-2 Phone: [phone] Date 05/07/2022 Time 9:25 AM

## 2022-05-07 NOTE — Telephone Encounter (Signed)
Call from [facility] need prescription for Ozempic changed to Ozempic 2 mg instead of the 1 mg.

## 2022-05-07 NOTE — Addendum Note (Signed)
Addended by: Elza Rafter on: 05/07/2022 01:49 PM   Modules accepted: Orders

## 2022-05-07 NOTE — Assessment & Plan Note (Signed)
A1c remains well controlled at 6.5% today. The patient has been taking Ozempic 1 mg/week, as she did not tolerate metformin due to GI side effects.  She has been tolerating the Ozempic well and has had no issues with it.  Patient overall feels well and has no acute concerns today.  Plan: - Increase Ozempic to 2 mg/week  - Follow up in 3 months; if A1c remains well controlled can space out visits to 6 months after - Diabetic foot exam performed today - Patient saw her eye doc in March; need records

## 2022-05-07 NOTE — Patient Instructions (Signed)
Thank you, Ms.Tali Barry Brunner for allowing [place] to provide your care today. Today we discussed:  Diabetes: We are increasing your ozempic to 2 mg per week. The injection will stay the same, but it is a higher dose now. Your diabetes is well controlled, so hopefully the next time we check it, it will continue to stay this way or be better!  We did your annual diabetic foot exam today  Blood pressure: Keep taking hydrochlorothiazide, amlodipine, and lisinopril. Your blood pressure is well controlled! We will check some labs at your next visit   I have ordered the following labs for you:   Lab Orders         Glucose, capillary         POC Hbg A1C        Referrals ordered today:   Referral Orders  No referral(s) requested today     I have ordered the following medication/changed the following medications:   Stop the following medications: Medications Discontinued During This Encounter  Medication Reason   Semaglutide, 1 MG/DOSE, (OZEMPIC, 1 MG/DOSE,) 4 MG/3ML SOPN      Start the following medications: Meds ordered this encounter  Medications   Semaglutide, 1 MG/DOSE, (OZEMPIC, 1 MG/DOSE,) 4 MG/3ML SOPN    Sig: Inject 2 mg into the skin once a week.    Dispense:  3 mL    Refill:  6     Follow up: 3 months for diabetes   Should you have any questions or concerns please call the internal medicine clinic at [phone].     Elza Rafter, D.O. [facility]

## 2022-05-16 ENCOUNTER — Other Ambulatory Visit: Payer: Self-pay | Admitting: Internal Medicine

## 2022-06-04 ENCOUNTER — Telehealth: Payer: Medicaid Other | Admitting: Physician Assistant

## 2022-06-04 DIAGNOSIS — B359 Dermatophytosis, unspecified: Secondary | ICD-10-CM

## 2022-06-04 MED ORDER — CLOTRIMAZOLE-BETAMETHASONE 1-0.05 % EX CREA: 1.0000 | TOPICAL_CREAM | Freq: Every day | CUTANEOUS | 0 refills | Status: DC

## 2022-06-04 NOTE — Progress Notes (Signed)
I have spent 5 minutes in review of e-visit questionnaire, review and updating patient chart, medical decision making and response to patient.   Jaskirat Zertuche Cody Eryck Negron, PA-C    

## 2022-06-04 NOTE — Progress Notes (Signed)
E Visit for Rash  We are sorry that you are not feeling well. Here is how we plan to help!  Based upon your presentation it appears you have a fungal infection.  I have prescribed: Lotrisone cream to apply twice daily for up to 2 weeks.    HOME CARE:  Take cool showers and avoid direct sunlight. Apply cool compress or wet dressings. Take a bath in an oatmeal bath.  Sprinkle content of one Aveeno packet under running faucet with comfortably warm water.  Bathe for 15-20 minutes, 1-2 times daily.  Pat dry with a towel. Do not rub the rash. Use hydrocortisone cream. Take an antihistamine like Benadryl for widespread rashes that itch.  The adult dose of Benadryl is 25-50 mg by mouth 4 times daily. Caution:  This type of medication may cause sleepiness.  Do not drink alcohol, drive, or operate dangerous machinery while taking antihistamines.  Do not take these medications if you have prostate enlargement.  Read package instructions thoroughly on all medications that you take.  GET HELP RIGHT AWAY IF:  Symptoms don't go away after treatment. Severe itching that persists. If you rash spreads or swells. If you rash begins to smell. If it blisters and opens or develops a yellow-brown crust. You develop a fever. You have a sore throat. You become short of breath.  MAKE SURE YOU:  Understand these instructions. Will watch your condition. Will get help right away if you are not doing well or get worse.  Thank you for choosing an e-visit.  Your e-visit answers were reviewed by a board certified advanced clinical practitioner to complete your personal care plan. Depending upon the condition, your plan could have included both over the counter or prescription medications.  Please review your pharmacy choice. Make sure the pharmacy is open so you can pick up prescription now. If there is a problem, you may contact your provider through [employer] and have the prescription routed to another  pharmacy.  Your safety is important to [place]. If you have drug allergies check your prescription carefully.   For the next 24 hours you can use MyChart to ask questions about today's visit, request a non-urgent call back, or ask for a work or school excuse. You will get an email in the next two days asking about your experience. I hope that your e-visit has been valuable and will speed your recovery.

## 2022-06-16 ENCOUNTER — Other Ambulatory Visit: Payer: Self-pay | Admitting: Internal Medicine

## 2022-06-16 DIAGNOSIS — E119 Type 2 diabetes mellitus without complications: Secondary | ICD-10-CM

## 2022-07-17 ENCOUNTER — Ambulatory Visit (INDEPENDENT_AMBULATORY_CARE_PROVIDER_SITE_OTHER): Payer: Medicaid Other

## 2022-07-17 VITALS — BP 136/89 | HR 81 | Ht 67.0 in | Wt 234.4 lb

## 2022-07-17 DIAGNOSIS — Z0000 Encounter for general adult medical examination without abnormal findings: Secondary | ICD-10-CM | POA: Diagnosis not present

## 2022-07-17 DIAGNOSIS — Z3042 Encounter for surveillance of injectable contraceptive: Secondary | ICD-10-CM | POA: Diagnosis not present

## 2022-07-17 MED ORDER — MEDROXYPROGESTERONE ACETATE 150 MG/ML IM SUSP
150.0000 mg | Freq: Once | INTRAMUSCULAR | Status: AC
  Administered 2022-07-17: 150 mg via INTRAMUSCULAR

## 2022-07-17 NOTE — Progress Notes (Signed)
   Subjective:     Raven Webb is a 36 y.o. female here at [facility] for a routine exam.  Current complaints: none.  Personal health questionnaire reviewed: yes.  Do you have a primary care provider? yes   [place] Office Visit from 05/07/2022 in [place]  PHQ-2 Total Score 0       Health Maintenance Due  Topic Date Due   OPHTHALMOLOGY EXAM  Never done   Diabetic kidney evaluation - Urine ACR  07/02/2011   COVID-19 Vaccine (3 - Pfizer series) 02/23/2020   INFLUENZA VACCINE  Never done    Risk factors for chronic health problems: Smoking: denies Alchohol/how much: denies Illicit drug use: denies Exercise: does not exercise Pt BMI: Body mass index is 36.71 kg/m.   Gynecologic History No LMP recorded. Patient has had an injection. Contraception: Depo-Provera injections Sexual health: Not currently sexually active Last Pap: 03/13/2021. Results were: normal Last mammogram: n/a  Obstetric History OB History  Gravida Para Term Preterm AB Living  4 3 1 2   3   SAB IAB Ectopic Multiple Live Births               # Outcome Date GA Lbr Len/2nd Weight Sex Delivery Anes PTL Lv  4 Gravida           3 Term           2 Preterm           1 Preterm             The following portions of the patient's history were reviewed and updated as appropriate: allergies, current medications, past family history, past medical history, past social history, past surgical history, and problem list.  Review of Systems Pertinent items are noted in HPI.    Objective:   BP 136/89   Pulse 81   Ht 5\' 7"  (1.702 m)   Wt 234 lb 6.4 oz (106.3 kg)   BMI 36.71 kg/m  VS reviewed, nursing note reviewed,  Constitutional: well developed, well nourished, no distress HEENT: normocephalic CV: normal rate Pulm/chest wall: normal effort Breast Exam:  Deferred with low risks and shared decision making, discussed recommendation to start mammogram between 40-50 yo/  exam Abdomen: soft Neuro: alert and oriented x 3 Skin: warm, dry Psych: affect normal Pelvic exam: Deferred  Assessment/Plan:   1. Encounter for surveillance of injectable contraceptive  - medroxyPROGESTERone (DEPO-PROVERA) injection 150 mg  2. Well woman exam without gynecological exam - Normal well woman exam - Pap up to date - Declines STI screening   Return in about 1 year (around 07/18/2023) for well woman exam or prn.   Renee Harder, CNM 3:18 PM

## 2022-07-17 NOTE — Progress Notes (Signed)
Raven Webb here for Depo-Provera  Injection.  Injection administered without complication. Patient will return in 3 months between 01/11 thru 10/16/21 for next injection.  Bethanne Ginger, B and E 07/17/2022  2:36 PM

## 2022-09-04 ENCOUNTER — Other Ambulatory Visit: Payer: Self-pay | Admitting: Internal Medicine

## 2022-09-27 ENCOUNTER — Telehealth: Payer: Medicaid Other | Admitting: Nurse Practitioner

## 2022-09-27 DIAGNOSIS — J40 Bronchitis, not specified as acute or chronic: Secondary | ICD-10-CM

## 2022-09-27 MED ORDER — BENZONATATE 100 MG PO CAPS: 100.0000 mg | ORAL_CAPSULE | Freq: Three times a day (TID) | ORAL | 0 refills | Status: DC | PRN

## 2022-09-27 MED ORDER — AZITHROMYCIN 250 MG PO TABS: ORAL_TABLET | ORAL | 0 refills | Status: DC

## 2022-09-27 NOTE — Progress Notes (Signed)

## 2022-10-02 ENCOUNTER — Other Ambulatory Visit: Payer: Self-pay

## 2022-10-02 ENCOUNTER — Ambulatory Visit (INDEPENDENT_AMBULATORY_CARE_PROVIDER_SITE_OTHER): Payer: Medicaid Other | Admitting: *Deleted

## 2022-10-02 VITALS — BP 159/112 | HR 79 | Ht 67.0 in | Wt 239.2 lb

## 2022-10-02 DIAGNOSIS — Z3042 Encounter for surveillance of injectable contraceptive: Secondary | ICD-10-CM

## 2022-10-02 MED ORDER — MEDROXYPROGESTERONE ACETATE 150 MG/ML IM SUSP
150.0000 mg | Freq: Once | INTRAMUSCULAR | Status: AC
  Administered 2022-10-02: 150 mg via INTRAMUSCULAR

## 2022-10-02 NOTE — Progress Notes (Signed)
Here for depo-provera injection. Last given 07/17/22. Last annual exam 07/17/22. Last pap 03/13/21. Injection given without complaint. BP elevated at 145/107, repeat after several minutes = 159/112. Advised to follow with PCP re: elevated blood pressures. She states she is taking all her bp meds usually but didn't take one because waiting on refill ( Norvasc) - will pick up and take today. She states she has been out of it for about 2 days. Denies headaches, chest pain. Reviewed warning signs of HTN and that there are not always symptoms. Reviewed with Dr. Currie Paris. No new orders - agrees with plan for pt to pick up refill and fu with PCP. Patient sent to checkout and schedule next injection. Staci Acosta

## 2022-10-13 NOTE — Progress Notes (Signed)
   CC: Routine Follow-up  HPI:   [name] is a 37 y.o. female with a past medical history of hypertension and IBS who presents for routine follow-up. She was last seen at [facility] in 04-2022 for diabetes and hypertension management.    Past Medical History:  Diagnosis Date   GERD (gastroesophageal reflux disease)    Hypertension    IBS (irritable bowel syndrome)    UTI (lower urinary tract infection)      Review of Systems:    Reports feeling fine overall Denies abdominal pain, nausea, diarrhea, boating, dizziness, constipation, fatigue   Physical Exam:  Vitals:   10/15/22 0955  BP: 135/77  Pulse: 75  Resp: (!) 24  Temp: 98.7 F (37.1 C)  TempSrc: Oral  SpO2: 100%  Weight: 237 lb 4.8 oz (107.6 kg)  Height: 5\' 7"  (1.702 m)    General:   awake and alert, sitting comfortably in chair, cooperative, not in acute distress Skin:   warm and dry, intact without any obvious lesions or scars, no rashes or lesions  Head:   normocephalic and atraumatic, oral mucosa moist Eyes:   extraocular movements intact, conjunctivae pink, pupils round Lungs:   normal respiratory effort, breathing unlabored, symmetrical chest rise, no crackles or wheezing Cardiac:   regular rate and rhythm, normal S1 and S2 Neurologic:   oriented to person-place-time, moving all extremities, no gross focal deficits Psychiatric:   subdued affect, intelligible speech    Assessment & Plan:   Resistant hypertension Patient has a history of hypertension managed at home with amlodipine, hydrochlorothiazide, and lisinopril. She reports taking these medications daily and checks her blood pressure regularly at home, which is typically in the 140/80s. Today in clinic, her blood pressure was 135/77. Most recent BMP collected 01-2022 only notable for Cr 1.04, both Na and K were within normal limits.   - Continue amlodipine 10mg , hydrochlorothiazide 25mg  q24, and lisinopril 40mg  q24 - Recheck BMP to monitor Na, K,  and Cr - Calibrate her home with the [facility] blood pressure cuff at next visit in three months     Obesity (BMI 35.0-39.9 without comorbidity) Patient has a history of obesity, current BMI is 37. She expressed some frustration with inability to lose weight and reverse her diabetes through pharmacologic and lifestyle changes. Discussed at length different weight loss strategies including calculated fasting and vegetable-rich diet.   - Check lipid panel - Referral to Physician Referred Exercise Program at the local [employer] - Encourage 10min moderate physical activity at least five times per week - Encourage whole-food plant-rich diet and calculated fasting to diversify metabolism    Type 2 diabetes mellitus ([place]) Patient has a history of diabetes managed at home with semaglutide, previously on metformin but unable to tolerate gastrointestinal side effects. Tolerating semaglutide well, denies abdominal pain, nausea, diarrhea, boating, dizziness, constipation, and fatigue. Most recent A1C collected 04-2022 was 6.5, today in clinic A1C was 6.6. Today, she expressed an interest in trialing a continuous glucose monitor and has previously been taught how to use one. Previous diabetic eye exam in March 2023.   - Continue semaglutide 2mg  q168 - Order CGM - Check A1C and Alb-Cre ratio - Referral to ophthalmology for annual eye examination for March      See Encounters Tab for problem based charting.  Patient discussed with Dr.  Saverio Danker

## 2022-10-15 ENCOUNTER — Ambulatory Visit (INDEPENDENT_AMBULATORY_CARE_PROVIDER_SITE_OTHER): Payer: Medicaid Other | Admitting: Student

## 2022-10-15 ENCOUNTER — Encounter: Payer: Self-pay | Admitting: Student

## 2022-10-15 ENCOUNTER — Other Ambulatory Visit: Payer: Self-pay

## 2022-10-15 VITALS — BP 135/77 | HR 75 | Temp 98.7°F | Resp 24 | Ht 67.0 in | Wt 237.3 lb

## 2022-10-15 DIAGNOSIS — I1A0 Resistant hypertension: Secondary | ICD-10-CM

## 2022-10-15 DIAGNOSIS — E119 Type 2 diabetes mellitus without complications: Secondary | ICD-10-CM

## 2022-10-15 DIAGNOSIS — E669 Obesity, unspecified: Secondary | ICD-10-CM

## 2022-10-15 DIAGNOSIS — Z6835 Body mass index (BMI) 35.0-35.9, adult: Secondary | ICD-10-CM

## 2022-10-15 DIAGNOSIS — Z794 Long term (current) use of insulin: Secondary | ICD-10-CM | POA: Diagnosis not present

## 2022-10-15 LAB — POCT GLYCOSYLATED HEMOGLOBIN (HGB A1C): Hemoglobin A1C: 6.6 % — AB (ref 4.0–5.6)

## 2022-10-15 LAB — GLUCOSE, CAPILLARY: Glucose-Capillary: 118 mg/dL — ABNORMAL HIGH (ref 70–99)

## 2022-10-15 MED ORDER — DEXCOM G6 TRANSMITTER MISC: 1.0000 [IU] | Freq: Once | 0 refills | Status: DC

## 2022-10-15 MED ORDER — DEXCOM G7 SENSOR MISC: 1.0000 [IU] | Freq: Once | 0 refills | Status: DC

## 2022-10-15 MED ORDER — DEXCOM G7 RECEIVER DEVI: 1.0000 [IU] | Freq: Once | 0 refills | Status: AC

## 2022-10-15 NOTE — Patient Instructions (Signed)
  Thank you, Ms.Shermaine Samuel Jester, for allowing [place] to provide your care today. Today we discussed . . .  > Hypertension       - continue to take your three medications and check your blood pressure daily at home       - please bring your blood pressure cuff with you to your next visit with [place] > Diabetes       - continue to take your semaglutide weekly and monitor your blood sugar       - we have ordered some labs and will call you with the results > Lifestyle       - we are sending you a referral to the [facility] through the Physician Referral Exercise Program       - keep exercising and following a whole-food plant-rich diet    I have ordered the following labs for you:  Lab Orders         Microalbumin / Creatinine Urine Ratio         BMP8+Anion Gap         Lipid Profile         POC Hbg A1C        Tests ordered today:  none   Referrals ordered today:   Referral Orders         Amb Referral To Provider Referral Exercise Program (P.R.E.P)        I have ordered the following medication/changed the following medications:   Stop the following medications: There are no discontinued medications.   Start the following medications: No orders of the defined types were placed in this encounter.     Follow up: 3 months    Remember:  Please continue to take your medications as prescribed and check your blood pressure at home. Bring your cuff with you to your next visit, so that we can compare measurements with our own device. We have referred you to an exercise program at the [facility] and ophthalmology for an eye exam. Good luck and have fun with the exercising! We will see you again in about three months.   Should you have any questions or concerns please call the internal medicine clinic at [phone].     Roswell Nickel, MD [place]

## 2022-10-15 NOTE — Assessment & Plan Note (Addendum)
Patient has a history of diabetes managed at home with semaglutide, previously on metformin but unable to tolerate gastrointestinal side effects. Tolerating semaglutide well, denies abdominal pain, nausea, diarrhea, boating, dizziness, constipation, and fatigue. Most recent A1C collected 04-2022 was 6.5, today in clinic A1C was 6.6. Today, she expressed an interest in trialing a continuous glucose monitor and has previously been taught how to use one. Last diabetic eye exam in March 2023.   - Continue semaglutide 2mg  q168 - Order CGM - Check A1C and Alb-Cre ratio - Referral to ophthalmology for annual eye examination for March 2024

## 2022-10-15 NOTE — Assessment & Plan Note (Signed)
Patient has a history of hypertension managed at home with amlodipine, hydrochlorothiazide, and lisinopril. She reports taking these medications daily and checks her blood pressure regularly at home, which is typically in the 140/80s. Today in clinic, her blood pressure was 135/77. Most recent BMP collected 01-2022 only notable for Cr 1.04, both Na and K were within normal limits.   - Continue amlodipine 10mg , hydrochlorothiazide 25mg  q24, and lisinopril 40mg  q24 - Recheck BMP to monitor Na, K, and Cr - Calibrate her home with the [facility] blood pressure cuff at next visit in three months

## 2022-10-15 NOTE — Assessment & Plan Note (Addendum)
Patient has a history of obesity, current BMI is 37. She expressed some frustration with inability to lose weight and reverse her diabetes through pharmacologic and lifestyle changes. Discussed at length different weight loss strategies including calculated fasting and vegetable-rich diet.   - Check lipid panel - Referral to Physician Referred Exercise Program at the local [employer] - Encourage 48min moderate physical activity at least five times per week - Encourage whole-food plant-rich diet and calculated fasting to diversify metabolism

## 2022-10-16 ENCOUNTER — Telehealth: Payer: Self-pay

## 2022-10-16 ENCOUNTER — Encounter: Payer: Self-pay | Admitting: Student

## 2022-10-16 LAB — BMP8+ANION GAP
Anion Gap: 16 mmol/L (ref 10.0–18.0)
BUN/Creatinine Ratio: 11 (ref 9–23)
BUN: 10 mg/dL (ref 6–20)
CO2: 22 mmol/L (ref 20–29)
Calcium: 9.5 mg/dL (ref 8.7–10.2)
Chloride: 101 mmol/L (ref 96–106)
Creatinine, Ser: 0.91 mg/dL (ref 0.57–1.00)
Glucose: 128 mg/dL — ABNORMAL HIGH (ref 70–99)
Potassium: 3.3 mmol/L — ABNORMAL LOW (ref 3.5–5.2)
Sodium: 139 mmol/L (ref 134–144)
eGFR: 85 mL/min/{1.73_m2} (ref >59)

## 2022-10-16 LAB — LIPID PANEL
Chol/HDL Ratio: 2.4 ratio (ref 0.0–4.4)
Cholesterol, Total: 113 mg/dL (ref 100–199)
HDL: 47 mg/dL (ref >39)
LDL Chol Calc (NIH): 51 mg/dL (ref 0–99)
Triglycerides: 75 mg/dL (ref 0–149)
VLDL Cholesterol Cal: 15 mg/dL (ref 5–40)

## 2022-10-16 NOTE — Telephone Encounter (Signed)
A Prior Authorization was initiated for this patients Dexcom G6 Transmitter through [employer].   Key: [id number]

## 2022-10-16 NOTE — Telephone Encounter (Signed)
A Prior Authorization was initiated for this patients DEXCOM G7 SENSORS through [employer]MyMeds.   Key: [id number]

## 2022-10-16 NOTE — Telephone Encounter (Signed)
A Prior Authorization was initiated for this patients Dexcom G7 Receiver device through CoverMyMeds.   Key: [id number]

## 2022-10-17 ENCOUNTER — Encounter: Payer: Self-pay | Admitting: Student

## 2022-10-17 LAB — MICROALBUMIN / CREATININE URINE RATIO
Creatinine, Urine: 179 mg/dL
Microalb/Creat Ratio: 4 mg/g creat (ref 0–29)
Microalbumin, Urine: 7 ug/mL

## 2022-10-17 NOTE — Telephone Encounter (Signed)
Prior Auth for patients medication DEXCOM CGM SUPPLIES  denied by [employer] via CoverMyMeds.   Reason: CONSIDERED TO PATIENTS WITH INSULIN DEPENDENT DIABETES  ALL DENIAL LETTERS SCANNED TO MEDIA

## 2022-10-19 MED ORDER — FREESTYLE LIBRE 2 READER DEVI: 1.0000 | Freq: Once | 0 refills | Status: AC

## 2022-10-19 MED ORDER — FREESTYLE LIBRE 2 SENSOR MISC: 1.0000 | Freq: Once | 0 refills | Status: DC

## 2022-10-20 ENCOUNTER — Telehealth: Payer: Self-pay

## 2022-10-20 NOTE — Addendum Note (Signed)
Addended by: Charise Killian on: 10/20/2022 04:38 PM   Modules accepted: Level of Service

## 2022-10-20 NOTE — Progress Notes (Signed)
Internal Medicine Clinic Attending  Case discussed with Dr. Harper  At the time of the visit.  We reviewed the resident's history and exam and pertinent patient test results.  I agree with the assessment, diagnosis, and plan of care documented in the resident's note.  

## 2022-10-20 NOTE — Telephone Encounter (Signed)
DECISION :    DENIED PT IS NOT ON INSULIN

## 2022-10-20 NOTE — Telephone Encounter (Signed)
Pa for pt ( FREESTYLE  LIBRE @ [employer] ) came through on cover my meds was submitted with last office visit and labs .Marland Kitchen Awaiting approval or denial ..        But I saw where the pharmacist had done a pa before  on the dexcom and it was denied due to pt is not on insulin  and that was one one of the requirements for the freestyle also .Marland Kitchen So this may also get denied

## 2022-10-20 NOTE — Telephone Encounter (Signed)
Requesting to speak with a nurse about freestyle sensor. States it's not on her medication list. Please call pt back.

## 2022-10-20 NOTE — Telephone Encounter (Signed)
This was addressed in Patient message from 10/17/22.

## 2022-10-20 NOTE — Telephone Encounter (Signed)
RTC to patient message left to call Clinics.  Call to [place] patient needs a PA for the [employer] and [employer].  Pharmacy to sentd over PA request.  Patient RTC and was informed that a PA request is being sent from her Pharmacy to be completed and sent back to her insurance Company for approval.

## 2022-11-14 ENCOUNTER — Other Ambulatory Visit: Payer: Self-pay | Admitting: Student

## 2022-12-18 ENCOUNTER — Other Ambulatory Visit: Payer: Self-pay

## 2022-12-18 ENCOUNTER — Ambulatory Visit (INDEPENDENT_AMBULATORY_CARE_PROVIDER_SITE_OTHER): Payer: Medicaid Other | Admitting: *Deleted

## 2022-12-18 VITALS — BP 135/91 | HR 70 | Ht 67.0 in | Wt 232.7 lb

## 2022-12-18 DIAGNOSIS — Z3042 Encounter for surveillance of injectable contraceptive: Secondary | ICD-10-CM

## 2022-12-18 MED ORDER — MEDROXYPROGESTERONE ACETATE 150 MG/ML IM SUSP
150.0000 mg | Freq: Once | INTRAMUSCULAR | Status: AC
  Administered 2022-12-18: 150 mg via INTRAMUSCULAR

## 2022-12-18 NOTE — Progress Notes (Signed)
Here for depo-provera. Last injection was 10/02/22. Last exam was 07/17/22. Last pap was 02/2021. Injection given without complaint. Sent to registrar to schedule next injection. Discussed BP elevated and she is taking her meds and following up with her PCP.  Staci Acosta

## 2022-12-19 ENCOUNTER — Ambulatory Visit: Payer: Medicaid Other

## 2023-01-07 ENCOUNTER — Encounter: Payer: Self-pay | Admitting: Student

## 2023-01-07 ENCOUNTER — Other Ambulatory Visit: Payer: Self-pay

## 2023-01-07 ENCOUNTER — Ambulatory Visit: Payer: Medicaid Other

## 2023-01-07 VITALS — BP 152/96 | HR 84 | Temp 98.7°F | Ht 67.0 in | Wt 228.6 lb

## 2023-01-07 DIAGNOSIS — E119 Type 2 diabetes mellitus without complications: Secondary | ICD-10-CM | POA: Diagnosis not present

## 2023-01-07 DIAGNOSIS — Z794 Long term (current) use of insulin: Secondary | ICD-10-CM

## 2023-01-07 DIAGNOSIS — I1A0 Resistant hypertension: Secondary | ICD-10-CM | POA: Diagnosis not present

## 2023-01-07 LAB — BASIC METABOLIC PANEL
Anion gap: 11 (ref 5–15)
BUN: 9 mg/dL (ref 6–20)
CO2: 25 mmol/L (ref 22–32)
Calcium: 10.1 mg/dL (ref 8.9–10.3)
Chloride: 93 mmol/L — ABNORMAL LOW (ref 98–111)
Creatinine, Ser: 1.13 mg/dL — ABNORMAL HIGH (ref 0.44–1.00)
GFR, Estimated: >60 mL/min (ref >60)
Glucose, Bld: 503 mg/dL (ref 70–99)
Potassium: 3.5 mmol/L (ref 3.5–5.1)
Sodium: 129 mmol/L — ABNORMAL LOW (ref 135–145)

## 2023-01-07 LAB — POCT GLYCOSYLATED HEMOGLOBIN (HGB A1C): Hemoglobin A1C: 8.7 % — AB (ref 4.0–5.6)

## 2023-01-07 LAB — GLUCOSE, CAPILLARY: Glucose-Capillary: 551 mg/dL (ref 70–99)

## 2023-01-07 MED ORDER — AMLODIPINE BESYLATE 10 MG PO TABS: 10.0000 mg | ORAL_TABLET | Freq: Every day | ORAL | 0 refills | Status: DC

## 2023-01-07 MED ORDER — METFORMIN HCL ER 500 MG PO TB24: 500.0000 mg | ORAL_TABLET | Freq: Every day | ORAL | 2 refills | Status: DC

## 2023-01-07 NOTE — Assessment & Plan Note (Addendum)
Patient presents with history of type 2 diabetes treated with semaglutide 2 mg.  She presents today after finding her glucose readings to be in the 500s.  This was corroborated by glucose here in the clinic.  A1c found to be elevated today 0.7.  She denies any changes in her diet.  Denies nausea, vomiting, diarrhea, confusion, vision changes, lethargy.  Endorses some paresthesias in her fingers.  Denies fevers, chills, infectious symptoms of the respiratory or urinary tract.  Denies chest pain, shortness of breath.  Endorses polyuria, polydipsia.  She endorses GI upset with metformin in the past so we will try extended release formulation.  BMP without elevated anion gap.  Encouraged good hydration. -Start metformin XR 500 mg daily -Continue semaglutide

## 2023-01-07 NOTE — Progress Notes (Signed)
   CC: Diabetes check  HPI:  Raven Webb is a 37 y.o. with past medical history as below who presents for diabetes check.  Please see detailed assessment and plan for HPI.  Past Medical History:  Diagnosis Date   GERD (gastroesophageal reflux disease)    Hypertension    IBS (irritable bowel syndrome)    UTI (lower urinary tract infection)    Review of Systems: Please see detailed assessment and plan for pertinent ROS.  Physical Exam:  Vitals:   01/07/23 1442 01/07/23 1454  BP: (!) 148/96 (!) 152/96  Pulse: 86 84  Temp: 98.7 F (37.1 C)   TempSrc: Oral   SpO2: 100%   Weight: 228 lb 9.6 oz (103.7 kg)   Height:  (1.702 m)    Physical Exam Constitutional:      General: She is not in acute distress. HENT:     Head: Normocephalic and atraumatic.  Eyes:     Extraocular Movements: Extraocular movements intact.  Cardiovascular:     Rate and Rhythm: Normal rate and regular rhythm.     Heart sounds: No murmur heard. Pulmonary:     Effort: Pulmonary effort is normal.     Breath sounds: No wheezing, rhonchi or rales.  Abdominal:     Palpations: Abdomen is soft.     Tenderness: There is no abdominal tenderness.  Musculoskeletal:     Cervical back: Neck supple.  Skin:    General: Skin is warm and dry.  Neurological:     Mental Status: She is alert and oriented to person, place, and time.  Psychiatric:        Mood and Affect: Mood normal.        Behavior: Behavior normal.      Assessment & Plan:   See Encounters Tab for problem based charting.  Type 2 diabetes mellitus (H[facility]) Patient presents with history of type 2 diabetes treated with semaglutide 2 mg.  She presents today after finding her glucose readings to be in the 500s.  This was corroborated by glucose here in the clinic.  A1c found to be elevated today 0.7.  She denies any changes in her diet.  Denies nausea, vomiting, diarrhea, confusion, vision changes, lethargy.  Endorses some paresthesias in her  fingers.  Denies fevers, chills, infectious symptoms of the respiratory or urinary tract.  Denies chest pain, shortness of breath.  Endorses polyuria, polydipsia.  She endorses GI upset with metformin in the past so we will try extended release formulation.  BMP without elevated anion gap.  Encouraged good hydration. -Start metformin XR 500 mg daily -Continue semaglutide  Resistant hypertension Patient presents with history of hypertension managed with amlodipine, hydrochlorothiazide, and lisinopril.  She reports not having had taken amlodipine for the past several weeks.  She states her blood pressure at home is typically in the 140 over 80s.  Her blood pressure today is 152/96. -Refill amlodipine -Continue hydrochlorothiazide and lisinopril  Patient discussed with Dr. Mikey Bussing

## 2023-01-07 NOTE — Assessment & Plan Note (Addendum)
Patient presents with history of hypertension managed with amlodipine, hydrochlorothiazide, and lisinopril.  She reports not having had taken amlodipine for the past several weeks.  She states her blood pressure at home is typically in the 140 over 80s.  Her blood pressure today is 152/96. -Refill amlodipine -Continue hydrochlorothiazide and lisinopril

## 2023-01-07 NOTE — Telephone Encounter (Signed)
Can we have patient come in to the office as soon as possible.

## 2023-01-07 NOTE — Patient Instructions (Signed)
Raven Webb, it was a pleasure seeing you today!  Today we discussed: Diabetes - Take metformin extended release 500 mg once daily with breakfast. High blood pressure - add amlodipine back to your regimen  I have ordered the following labs today:   Lab Orders         Glucose, capillary         BMP w Anion Gap (STAT/Sunquest-performed on-site)         POC Hbg A1C      Tests ordered today:  none  Referrals ordered today:   Referral Orders  No referral(s) requested today     I have ordered the following medication/changed the following medications:   Stop the following medications: Medications Discontinued During This Encounter  Medication Reason   amLODipine (NORVASC) 10 MG tablet Reorder     Start the following medications: Meds ordered this encounter  Medications   amLODipine (NORVASC) 10 MG tablet    Sig: Take 1 tablet (10 mg total) by mouth daily.    Dispense:  90 tablet    Refill:  0   metFORMIN (GLUCOPHAGE-XR) 500 MG 24 hr tablet    Sig: Take 1 tablet (500 mg total) by mouth daily with breakfast.    Dispense:  30 tablet    Refill:  2     Follow-up: 3 months   Please make sure to arrive 15 minutes prior to your next appointment. If you arrive late, you may be asked to reschedule.   We look forward to seeing you next time. Please call our clinic at [phone] if you have any questions or concerns. The best time to call is Monday-Friday from 9am-4pm, but there is someone available 24/7. If after hours or the weekend, call the main hospital number and ask for the Internal Medicine Resident On-Call. If you need medication refills, please notify your pharmacy one week in advance and they will send [place] a request.  Thank you for letting us take part in your care. Wishing you the best!  Thank you, Adron Bene, MD

## 2023-01-08 ENCOUNTER — Other Ambulatory Visit: Payer: Self-pay | Admitting: Student

## 2023-01-08 ENCOUNTER — Encounter: Payer: Self-pay | Admitting: Student

## 2023-01-08 ENCOUNTER — Telehealth: Payer: Self-pay | Admitting: Dietician

## 2023-01-08 NOTE — Telephone Encounter (Signed)
Mom and daughter call about continued high blood sugar and would like a CGM prescribed. I informed then that it is not covered by Myoshi's insurance if not taking insulin. They verbalized understanding.

## 2023-01-08 NOTE — Progress Notes (Signed)
Internal Medicine Clinic Attending  I saw and evaluated the patient.  I personally confirmed the key portions of the history and exam documented by the resident  and I reviewed pertinent patient test results.  The assessment, diagnosis, and plan were formulated together and I agree with the documentation in the resident's note.  

## 2023-01-09 ENCOUNTER — Other Ambulatory Visit: Payer: Self-pay | Admitting: Internal Medicine

## 2023-01-09 DIAGNOSIS — E119 Type 2 diabetes mellitus without complications: Secondary | ICD-10-CM

## 2023-01-13 ENCOUNTER — Ambulatory Visit (INDEPENDENT_AMBULATORY_CARE_PROVIDER_SITE_OTHER): Payer: Medicaid Other | Admitting: Dietician

## 2023-01-13 ENCOUNTER — Other Ambulatory Visit: Payer: Self-pay | Admitting: Dietician

## 2023-01-13 DIAGNOSIS — E119 Type 2 diabetes mellitus without complications: Secondary | ICD-10-CM

## 2023-01-13 MED ORDER — FREESTYLE LIBRE 3 SENSOR MISC
11 refills | Status: DC
Start: 2023-01-13 — End: 2023-07-13

## 2023-01-13 MED ORDER — LANTUS SOLOSTAR 100 UNIT/ML ~~LOC~~ SOPN: 20.0000 [IU] | PEN_INJECTOR | Freq: Every day | SUBCUTANEOUS | 1 refills | Status: DC

## 2023-01-13 MED ORDER — INSULIN PEN NEEDLE 32G X 4 MM MISC
3 refills | Status: AC
Start: 2023-01-13 — End: ?

## 2023-01-13 NOTE — Telephone Encounter (Signed)
To Start insulin per Dr. Mikey Bussing

## 2023-01-13 NOTE — Telephone Encounter (Signed)
Patient requests  a referral to Provider Referral Exercise Program and Freestyle Libre 3 CGM sensors

## 2023-01-13 NOTE — Progress Notes (Signed)
Diabetes Self-Management Education  Visit Type:  Follow-up (#2 after initial)  Appt. Start Time:945 Appt. End Time: 1015  01/13/2023  Raven Webb, identified by name and date of birth, is a 37 y.o. female with a diagnosis of Diabetes:  .   ASSESSMENT Lab Results  Component Value Date   HGBA1C 8.7 (A) 01/07/2023   HGBA1C 6.6 (A) 10/15/2022   HGBA1C 6.5 (A) 05/07/2022   HGBA1C 6.5 (A) 01/20/2022   HGBA1C 5.8 (A) 08/28/2021    BP Readings from Last 3 Encounters:  01/07/23 (!) 152/96  12/18/22 (!) 135/91  10/15/22 135/77       Diabetes Self-Management Education - 01/13/23 1300       Health Coping   How would you rate your overall health? Fair   did not ask but she reports increased thirst, urination and fatigue     Pre-Education Assessment   Patient understands using medications safely. --   starting insulin today   Patient understands monitoring blood glucose, interpreting and using results --   starting back on CGM today- was provided with a sample FL2 sensor     Complications   Last HgB A1C per patient/outside source 8.7 %    How often do you check your blood sugar? 1-2 times/day    Fasting Blood glucose range (mg/dL) >604    Postprandial Blood glucose range (mg/dL) >540    Number of hypoglycemic episodes per month 0    Number of hyperglycemic episodes ( >200mg /dL): Daily   981% of the time   Can you tell when your blood sugar is high? Yes    What do you do if your blood sugar is high? cuts back on carbs and drinks more water, then called the doctor    Have you had a dilated eye exam in the past 12 months? No    Have you had a dental exam in the past 12 months? No    Are you checking your feet? Yes    How many days per week are you checking your feet? 7      Dietary Intake   Breakfast geleatins with fruit    Dinner grilled chicken salad, pizza    Beverage(s) water, diet soda      Activity / Exercise   Activity / Exercise Type ADL's;Light (walking / raking  leaves)      Patient Education   Previous Diabetes Education Yes (please comment)   here   Medications Taught/reviewed insulin/injectables, injection, site rotation, insulin/injectables storage and needle disposal.;Reviewed patients medication for diabetes, action, purpose, timing of dose and side effects.    Monitoring Taught/evaluated CGM (comment);Identified appropriate SMBG and/or A1C goals.    Acute complications Taught prevention, symptoms, and  treatment of hypoglycemia - the 15 rule.      Individualized Goals (developed by patient)   Medications take my medication as prescribed    Monitoring  Consistenly use CGM      Patient Self-Evaluation of Goals - Patient rates self as meeting previously set goals (% of time)   Nutrition 25 - 50% (sometimes)      Post-Education Assessment   Patient understands incorporating nutritional management into lifestyle. Comprehends key points    Patient undertands incorporating physical activity into lifestyle. Comprehends key points    Patient understands using medications safely. Comphrehends key points    Patient understands monitoring blood glucose, interpreting and using results Comprehends key points    Patient understands prevention, detection, and treatment of acute complications. Comprehends key  points      Outcomes   Program Status Not Completed      Subsequent Visit   Since your last visit have you continued or begun to take your medications as prescribed? Yes    Since your last visit have you had your blood pressure checked? Yes    Is your most recent blood pressure lower, unchanged, or higher since your last visit? Higher    Since your last visit have you experienced any weight changes? Loss    Weight Loss (lbs) 6    Since your last visit, are you checking your blood glucose at least once a day? Yes   using a meter that shows 32 test oiver past 30 days, overall average 437, range of 169-597            Learning Objective:   Patient will have a greater understanding of diabetes self-management. Patient education plan is to attend individual and/or group sessions per assessed needs and concerns.   Plan:   Patient Instructions  Myoshi,   You are eating and drinking properly. Keep up the good work to help lower your blood sugar and keep you healthy!  Goal is to start insulin as soon as possible. Take insulin the same time each day. Store insulin you are using at room temp, others that you are not using should be stored in frig.   I'll request Freestyle Libre 3 CGM and exercise Program referral  Please make an appointment in 2-3 weeks.  Bring meter.  [name] [phone]        Expected Outcomes:  Demonstrated interest in learning. Expect positive outcomes  Education material provided: My Plate and Diabetes Resources  If problems or questions, patient to contact team via:  Phone  Future DSME appointment: - 2 wks Norm Parcel, RD 01/13/2023 2:23 PM.

## 2023-01-13 NOTE — Patient Instructions (Addendum)
Myoshi,   You are eating and drinking properly. Keep up the good work to help lower your blood sugar and keep you healthy!  Goal is to start insulin as soon as possible. Take insulin the same time each day. Store insulin you are using at room temp, others that you are not using should be stored in frig.   I'll request Freestyle Libre 3 CGM and exercise Program referral  Please make an appointment in 2-3 weeks.  Bring meter.  [name] [phone]

## 2023-01-14 ENCOUNTER — Telehealth: Payer: Self-pay

## 2023-01-14 NOTE — Telephone Encounter (Signed)
Prior Authorization for patient (Freestyle Libre 3 sensor) came through on cover my meds was submitted with last office notes and labs awaiting approval or denial 

## 2023-01-14 NOTE — Telephone Encounter (Signed)
Decision:Approved Naida Sleight ([employer]) PA Case ID #: [id number] Rx #: [id number] Need Help? Call us at [phone] Outcome Approved today PA Case: [id number], Status: Approved, Coverage Starts on: 01/14/2023 12:00:00 AM, Coverage Ends on: 07/13/2023 12:00:00 AM. Authorization Expiration Date: 07/12/2023 Drug FreeStyle Libre 3 Sensor ePA cloud logo Form [employer] [place] Form [id number] NCPDP) Original Claim Info 75 CALL [phone] ORSUBMIT PA TO HTTP  Approval has been faxed to the pharmacy.

## 2023-01-15 ENCOUNTER — Ambulatory Visit (INDEPENDENT_AMBULATORY_CARE_PROVIDER_SITE_OTHER): Payer: Medicaid Other | Admitting: Internal Medicine

## 2023-01-15 ENCOUNTER — Encounter: Payer: Self-pay | Admitting: Student

## 2023-01-15 ENCOUNTER — Telehealth: Payer: Self-pay

## 2023-01-15 DIAGNOSIS — E119 Type 2 diabetes mellitus without complications: Secondary | ICD-10-CM

## 2023-01-15 NOTE — Telephone Encounter (Signed)
I did offer and appt to come in the Am but she stated that pt was in last week and to see donna a few days ago

## 2023-01-15 NOTE — Progress Notes (Addendum)
  [facility] Internal Medicine Residency Telephone Encounter Continuity Care Appointment  HPI:  This telephone encounter was created for [name] on 01/18/2023 for the following purpose/cc medication problem.   Past Medical History:  Past Medical History:  Diagnosis Date   GERD (gastroesophageal reflux disease)    Hypertension    IBS (irritable bowel syndrome)    UTI (lower urinary tract infection)      ROS:  Negative unless stated in the HPI   Assessment / Plan / Recommendations:  Please see A&P under problem oriented charting for assessment of the patient's acute and chronic medical conditions.  As always, pt is advised that if symptoms worsen or new symptoms arise, they should go to an urgent care facility or to to ER for further evaluation.   Consent and Medical Decision Making:  Patient discussed with Dr. Criselda Peaches This is a telephone encounter between Raven Webb and Raven Webb on 01/18/2023 for hyperglycemia. The visit was conducted with the patient located at home and Raven Webb at [facility]. The patient's identity was confirmed using their DOB and current address. The patient has consented to being evaluated through a telephone encounter and understands the associated risks (an examination cannot be done and the patient may need to come in for an appointment) / benefits (allows the patient to remain at home, decreasing exposure to coronavirus). I personally spent 25 minutes on medical discussion.    Type 2 diabetes mellitus (HCC) Pt scheduled a televisit due to hyperglycemia. She was recently started on insulin 20 units. Her other medications include metformin 500 mg BID and Ozempic 2mg  on Mondays. She reports her CGM has been given her "too high to read" when she checked her CBG it was 433. Pt is not having any nausea or vomiting but states she has some diarrhea. Result lab review shows A1c 8.7 and BMP with elevated creatinine. I advised pt to give herself 5 units of insulin  right now and increase her insulin in 2 units interval per day until her fasting sugars are <160. Gradual interval increase used to prevent hypoglycemia given elevated creatinine. Teach back method used and pt verbalizes understanding.   Interval update: Spoke with pt on 4/26 and she states her sugars improved overnight into 200s and she injected 22 units this am but sugars were in the low 500s again in the afternoon. Sugar at that time of speaking with her was checked and was 350. I advised her to give another 5 units at that time and increase her morning dose to 28 units. Will have pt come in to be seen on Monday for repeat BMP. ED precautions given including unable to tolerate PO, severe nausea or vomiting, dizziness, altered mental status or acute change in status.   Interval update: pt still reported readings elevated in 300s but no readings in 500s. Advised pt to administer 30 units in the am and 10 units at night. Plan to see her on 04/29 at 10:15 am. [employer] regarding the appointment.

## 2023-01-15 NOTE — Telephone Encounter (Signed)
Called pt who stated her BS's are running high - fasting BS 300's to 400's. She's taking Metformin BID and Lantus 20 units in the am. Telehealth appt schedule today with Dr Welton Flakes @ 1515PM.

## 2023-01-15 NOTE — Telephone Encounter (Signed)
Pt  mother is  requesting a call back ... She stated that  her blood sugars is still running high  since her last of visit

## 2023-01-16 NOTE — Assessment & Plan Note (Addendum)
Pt scheduled a televisit due to hyperglycemia. She was recently started on insulin 20 units. Her other medications include metformin 500 mg BID and Ozempic 2mg  on Mondays. She reports her CGM has been given her "too high to read" when she checked her CBG it was 433. Pt is not having any nausea or vomiting but states she has some diarrhea. Result lab review shows A1c 8.7 and BMP with elevated creatinine. I advised pt to give herself 5 units of insulin right now and increase her insulin in 2 units interval per day until her fasting sugars are <160. Gradual interval increase used to prevent hypoglycemia given elevated creatinine. Teach back method used and pt verbalizes understanding.   Interval update: Spoke with pt on 4/26 and she states her sugars improved overnight into 200s and she injected 22 units this am but sugars were in the low 500s again in the afternoon. Sugar at that time of speaking with her was checked and was 350. I advised her to give another 5 units at that time and increase her morning dose to 28 units. Will have pt come in to be seen on Monday for repeat BMP. ED precautions given including unable to tolerate PO, severe nausea or vomiting, dizziness, altered mental status or acute change in status.   Interval update: pt still reported readings elevated in 300s but no readings in 500s. Advised pt to administer 30 units in the am and 10 units at night. Plan to see her on 04/29 at 10:15 am. [employer] regarding the appointment.

## 2023-01-19 ENCOUNTER — Ambulatory Visit (INDEPENDENT_AMBULATORY_CARE_PROVIDER_SITE_OTHER): Payer: Medicaid Other | Admitting: Internal Medicine

## 2023-01-19 ENCOUNTER — Encounter: Payer: Self-pay | Admitting: Internal Medicine

## 2023-01-19 ENCOUNTER — Other Ambulatory Visit (HOSPITAL_COMMUNITY): Payer: Self-pay

## 2023-01-19 VITALS — BP 155/98 | HR 78 | Temp 98.8°F | Ht 67.0 in | Wt 228.5 lb

## 2023-01-19 DIAGNOSIS — I1A0 Resistant hypertension: Secondary | ICD-10-CM

## 2023-01-19 DIAGNOSIS — E119 Type 2 diabetes mellitus without complications: Secondary | ICD-10-CM | POA: Diagnosis not present

## 2023-01-19 DIAGNOSIS — Z7985 Long-term (current) use of injectable non-insulin antidiabetic drugs: Secondary | ICD-10-CM

## 2023-01-19 DIAGNOSIS — I10 Essential (primary) hypertension: Secondary | ICD-10-CM | POA: Diagnosis not present

## 2023-01-19 DIAGNOSIS — Z794 Long term (current) use of insulin: Secondary | ICD-10-CM

## 2023-01-19 MED ORDER — INSULIN PEN NEEDLE 32G X 4 MM MISC
1.0000 | Freq: Three times a day (TID) | 11 refills | Status: DC
  Filled 2023-01-19: qty 100, 34d supply, fill #0

## 2023-01-19 MED ORDER — INSULIN LISPRO (1 UNIT DIAL) 100 UNIT/ML (KWIKPEN)
10.0000 [IU] | PEN_INJECTOR | Freq: Three times a day (TID) | SUBCUTANEOUS | 3 refills | Status: DC
Start: 2023-01-19 — End: 2023-01-23
  Filled 2023-01-19: qty 9, 30d supply, fill #0
  Filled 2023-01-19: qty 10, 28d supply, fill #0

## 2023-01-19 NOTE — Progress Notes (Signed)
CC: DMII follow up  HPI:  [name] is a 37 y.o. with medical history of HTN, DMII presenting to [facility] for DMII follow up.   Please see problem-based list for further details, assessments, and plans.  Past Medical History:  Diagnosis Date   GERD (gastroesophageal reflux disease)    Hypertension    IBS (irritable bowel syndrome)    UTI (lower urinary tract infection)     Current Outpatient Medications (Endocrine & Metabolic):    insulin lispro (HUMALOG) 100 UNIT/ML KwikPen, Inject 10 Units into the skin 3 (three) times daily with meals.  Do not inject if you do not eat a meal.   insulin glargine (LANTUS SOLOSTAR) 100 UNIT/ML Solostar Pen, Inject 20 Units into the skin daily.   OZEMPIC, 2 MG/DOSE, 8 MG/3ML SOPN, INJECT 2 MG INTO THE SKIN ONCE A WEEK.  Current Outpatient Medications (Cardiovascular):    amLODipine (NORVASC) 10 MG tablet, Take 1 tablet (10 mg total) by mouth daily.   hydrochlorothiazide (HYDRODIURIL) 25 MG tablet, TAKE 1 TABLET (25 MG TOTAL) BY MOUTH DAILY.   lisinopril (ZESTRIL) 40 MG tablet, TAKE 1 TABLET (40 MG TOTAL) BY MOUTH DAILY.  Current Outpatient Medications (Respiratory):    albuterol (PROVENTIL HFA) 108 (90 Base) MCG/ACT inhaler, Inhale 1-2 puffs into the lungs every 6 (six) hours as needed for wheezing or shortness of breath.    Current Outpatient Medications (Other):    ACCU-CHEK GUIDE test strip, USE TO CHECK BLOOD SUGAR 3 TIMES DAILY   Accu-Chek Softclix Lancets lancets, USE TO CHECK BLOOD SUGAR 3 TIMES DAILY   Blood Glucose Monitoring Suppl (ACCU-CHEK GUIDE) w/Device KIT, 1 each by Does not apply route daily at 6 (six) AM. Use to check blood sugar 3 times daily   clotrimazole-betamethasone (LOTRISONE) cream, Apply 1 Application topically daily.   Continuous Glucose Sensor (FREESTYLE LIBRE 3 SENSOR) MISC, Place 1 sensor on the skin every 14 days. Use to check glucose continuously   Insulin Pen Needle 32G X 4 MM MISC, Use to inject insulin at  the same time each day   Insulin Pen Needle 32G X 4 MM MISC, Inject 1 Needle into the skin in the morning, at noon, and at bedtime.   metoCLOPramide (REGLAN) 10 MG tablet, TAKE 1 TABLET (10 MG TOTAL) BY MOUTH EVERY 6 (SIX) HOURS.  Review of Systems:  Review of system negative unless stated in the problem list or HPI.    Physical Exam:  Vitals:   01/19/23 1029  BP: (!) 155/98  Pulse: 78  Temp: 98.8 F (37.1 C)  TempSrc: Oral  SpO2: 100%  Weight: 228 lb 8 oz (103.6 kg)  Height: 5\' 7"  (1.702 m)   Physical Exam General: NAD HENT: NCAT Lungs: CTAB, no wheeze, rhonchi or rales.  Cardiovascular: Normal heart sounds, no r/m/g, 2+ pulses in all extremities. No LE edema Abdomen: No TTP, normal bowel sounds MSK: No asymmetry or muscle atrophy.  Skin: no lesions noted on exposed skin Neuro: Alert and oriented x4. CN grossly intact Psych: Normal mood and normal affect   Assessment & Plan:   Resistant hypertension On amlodipine 10 mg qd, lisinopril 40 mg, HCTZ 25 mg qd. Normal RA ratio. No renal ultrasound. States taking all medications recently. No checking BP at home. Will continue current regimen at this time and advise pt to obtain home readings. If elevated at home, then we can pursue sleep study and renal ultrasound at follow up visits. Pt's mother who is with the pt  has a monitor at home.   Type 2 diabetes mellitus (HCC) Pt with DMII and recently started on insulin. Insulin gradually increased and current regimen includes Insulin 30 units am and 10 units pm. Ozempic 2 mg weekly. Last injected on Monday. Wants to stop Metformin and Ozempic due to GI side effects including diarrhea and bloating. Recent A1c is elevated A1c 8.7 in 12/2022. Glucose consistently >200-300 on the current regimen with spike after meals. Pt states she regularly eats lunch and dinner but intermittently breakfast. Denies snacking or sugary beverages. Pt will benefit from mealtime insulin with insulin llispro 10  units TID with meals. Educated on the importance of having meals ready to eat before injecting this insulin. Will also repeat BMP and obtain C-peptide, insulin level, anti-islet antibody, and GAD autoantibody to differentiate T1DM vs T2DM and assess pt's innate insulin production. Sample insulin injection used to assess pt's technique which is appropriate.    See Encounters Tab for problem based charting.  Patient Discussed with Dr. Karrie Meres, MD [name]. [facility] Internal Medicine Residency, PGY-2

## 2023-01-19 NOTE — Assessment & Plan Note (Addendum)
Pt with DMII and recently started on insulin. Insulin gradually increased and current regimen includes Insulin 30 units am and 10 units pm. Ozempic 2 mg weekly. Last injected on Monday. Wants to stop Metformin and Ozempic due to GI side effects including diarrhea and bloating. Recent A1c is elevated A1c 8.7 in 12/2022. Glucose consistently >200-300 on the current regimen with spike after meals. Pt states she regularly eats lunch and dinner but intermittently breakfast. Denies snacking or sugary beverages. Pt will benefit from mealtime insulin with insulin llispro 10 units TID with meals. Educated on the importance of having meals ready to eat before injecting this insulin. Will also repeat BMP and obtain C-peptide, insulin level, anti-islet antibody, and GAD autoantibody to differentiate T1DM vs T2DM and assess pt's innate insulin production. Sample insulin injection used to assess pt's technique which is appropriate.

## 2023-01-19 NOTE — Patient Instructions (Signed)
Raven Webb, it was a pleasure seeing you today! You endorsed feeling well today. Below are some of the things we talked about this visit. We look forward to seeing you in the follow up appointment!  Today we discussed: Continue long acting 40 units.  Start 10 units short acting with meals.  We will check some lab work today.   I have ordered the following labs today:   Lab Orders         BMP8+Anion Gap         Insulin and C-Peptide         Anti-islet cell antibody         Glutamic acid decarboxylase auto abs       Referrals ordered today:   Referral Orders  No referral(s) requested today     I have ordered the following medication/changed the following medications:   Stop the following medications: Medications Discontinued During This Encounter  Medication Reason   metFORMIN (GLUCOPHAGE-XR) 500 MG 24 hr tablet Side effect (s)   azithromycin (ZITHROMAX Z-PAK) 250 MG tablet Completed Course   benzonatate (TESSALON PERLES) 100 MG capsule Completed Course     Start the following medications: Meds ordered this encounter  Medications   insulin lispro (HUMALOG) 100 UNIT/ML injection    Sig: Inject 0.1 mLs (10 Units total) into the skin 3 (three) times daily with meals. Inject 10 units with meals. Do not inject if you do not eat a meal.    Dispense:  10 mL    Refill:  3     Follow-up: 1 week follow up   Please make sure to arrive 15 minutes prior to your next appointment. If you arrive late, you may be asked to reschedule.   We look forward to seeing you next time. Please call our clinic at [phone] if you have any questions or concerns. The best time to call is Monday-Friday from 9am-4pm, but there is someone available 24/7. If after hours or the weekend, call the main hospital number and ask for the Internal Medicine Resident On-Call. If you need medication refills, please notify your pharmacy one week in advance and they will send [place] a request.  Thank you for  letting us take part in your care. Wishing you the best!  Thank you, [name], MD

## 2023-01-19 NOTE — Assessment & Plan Note (Signed)
On amlodipine 10 mg qd, lisinopril 40 mg, HCTZ 25 mg qd. Normal RA ratio. No renal ultrasound. States taking all medications recently. No checking BP at home. Will continue current regimen at this time and advise pt to obtain home readings. If elevated at home, then we can pursue sleep study and renal ultrasound at follow up visits. Pt's mother who is with the pt has a monitor at home.

## 2023-01-20 ENCOUNTER — Telehealth: Payer: Self-pay | Admitting: Dietician

## 2023-01-20 DIAGNOSIS — E119 Type 2 diabetes mellitus without complications: Secondary | ICD-10-CM

## 2023-01-20 LAB — BMP8+ANION GAP
Anion Gap: 15 mmol/L (ref 10.0–18.0)
BUN/Creatinine Ratio: 11 (ref 9–23)
BUN: 9 mg/dL (ref 6–20)
Creatinine, Ser: 0.82 mg/dL (ref 0.57–1.00)
Potassium: 4.1 mmol/L (ref 3.5–5.2)
eGFR: 96 mL/min/{1.73_m2} (ref >59)

## 2023-01-20 LAB — GLUTAMIC ACID DECARBOXYLASE AUTO ABS

## 2023-01-20 NOTE — Telephone Encounter (Signed)
Raven Webb's mother calls and says she has a Humalog Inpen and Humalog cartridges she would like her daughter to be able to [place]. Her daughter is in agreement. She wants permission to use it and help with the Inpen settings for her daughter. Raven Webb has taken 10 units twice with lowst blood sugar 268 last night.(We have remote access to blood sugars and fasting today 328. Will discuss with Dr. Welton Flakes.  Discussed with Dr. Welton Flakes and he approved her use of the Humalog Inpen. Settings given to [facility] and her mother: Max calculated dose 30 units Duration of insulin action 4 hours Target blood glucose 150 mg/dL Insulin Sensitivity Factor 33 Meal setting: fixed dose 10 units for breakfast, lunch and dinner We confirmed her inpen settings using teach back. Patient requests Humalog cartridges.

## 2023-01-21 LAB — BMP8+ANION GAP
CO2: 21 mmol/L (ref 20–29)
Chloride: 99 mmol/L (ref 96–106)

## 2023-01-21 LAB — ANTI-ISLET CELL ANTIBODY: Islet Cell Ab: NEGATIVE

## 2023-01-21 LAB — INSULIN AND C-PEPTIDE, SERUM: INSULIN: 13.3 u[IU]/mL (ref 2.6–24.9)

## 2023-01-22 LAB — BMP8+ANION GAP
Calcium: 9.9 mg/dL (ref 8.7–10.2)
Glucose: 322 mg/dL — ABNORMAL HIGH (ref 70–99)
Sodium: 135 mmol/L (ref 134–144)

## 2023-01-22 LAB — INSULIN AND C-PEPTIDE, SERUM: C-Peptide: 2.4 ng/mL (ref 1.1–4.4)

## 2023-01-22 NOTE — Progress Notes (Signed)
Dear Ms Raven Webb,  Your labs look stable. Please continue taking your medicines as you discussed with Dr. Welton Flakes at your recent visit.  Sincerely, Dr. Mercie Eon

## 2023-01-22 NOTE — Telephone Encounter (Signed)
Raven Webb called asking for cartridges to use in her Inpen.

## 2023-01-23 ENCOUNTER — Other Ambulatory Visit (HOSPITAL_COMMUNITY): Payer: Self-pay

## 2023-01-23 MED ORDER — INSULIN LISPRO 100 UNIT/ML CARTRIDGE
10.0000 [IU] | Freq: Three times a day (TID) | SUBCUTANEOUS | 11 refills | Status: DC
Start: 2023-01-23 — End: 2023-01-28
  Filled 2023-01-23: qty 9, 30d supply, fill #0

## 2023-01-23 NOTE — Addendum Note (Signed)
Addended by: [name] on: 01/23/2023 09:04 AM   Modules accepted: Orders

## 2023-01-27 ENCOUNTER — Ambulatory Visit (INDEPENDENT_AMBULATORY_CARE_PROVIDER_SITE_OTHER): Payer: Medicaid Other | Admitting: Dietician

## 2023-01-27 DIAGNOSIS — E119 Type 2 diabetes mellitus without complications: Secondary | ICD-10-CM | POA: Diagnosis not present

## 2023-01-27 DIAGNOSIS — E118 Type 2 diabetes mellitus with unspecified complications: Secondary | ICD-10-CM

## 2023-01-27 MED ORDER — SEMAGLUTIDE (1 MG/DOSE) 4 MG/3ML ~~LOC~~ SOPN
1.0000 mg | PEN_INJECTOR | SUBCUTANEOUS | 0 refills | Status: DC
Start: 2023-01-27 — End: 2023-01-28

## 2023-01-27 NOTE — Addendum Note (Signed)
Addended by: Earl Lagos on: 01/27/2023 01:02 PM   Modules accepted: Orders

## 2023-01-27 NOTE — Assessment & Plan Note (Signed)
-  Patient was in the office today to see our diabetes educator [name]).   -Her blood sugars were uncontrolled and she has stopped taking her metformin and Ozempic secondary to GI upset. -I reviewed the blood sugar readings with [name] and she was noted to have persistently elevated blood sugars with an average blood glucose of 309 over the last 14 days. -We made adjustments to her insulin sensitivity factor to her InPen changing it from 33 to 26.6 -Patient is also going to restart Ozempic.  Given that it has been approximately 2 weeks since she took it and was having GI upset at a higher dose we will restart it at 1 mg/week -This prescription was sent into her pharmacy by me -I did tell patient that if she is doing well on the 1 mg dose to let us know and we can retry the 2 mg dose. -No further workup at this time

## 2023-01-27 NOTE — Progress Notes (Signed)
Diabetes Self-Management Education  Visit Type:  Follow-up (#3 after initial)  Appt. Start Time: 1015 Appt. End Time: 1115  01/27/2023  [name], identified by name and date of birth, is a 37 y.o. female with a diagnosis of Diabetes:  .   ASSESSMENT  [name] states she feels like the insulin is not working. However, she feels better and her urination is less and had a blood sugar of 99 that scared her.   She is currently only taking insulin. The metformin was stopped two weeks ago and that was also her last dose of Ozempic. These medicine were stopped due to ingoing nausea that has been occurring after eating for abut 2 years since she had her gallbladder out.    We discussed  her diabetes care with Dr. Heide Spark who came to the room and met with patient and approved increase in Insulin sensitivity factor in her INpen Therapy settings from 33 to 26.6. He is also going to have her restart her Ozempic at a lower dose. We discussed that the  two days she was more active, her blood sugars were significantly lower. She put a pedometer/step app on her apple watch in the office today to track her steps to help her to monitor her activity.  Inpen report showed: using 34.4 u/day Humalog plus 30 units lantus daily ( 20 in am and 10 in pm) for new total daily dose of 64.4 u/day she is taking 5.9 rapid acting doses per day. She states that snacks like chips and little debbie cakes are difficult to avoid for her. She is unsure how to dose for them. Will follow up with her about this and offer the PREP class.    CGM Results from download:   % Time CGM active:   97 %   (Goal >70%)  Average glucose:   309 mg/dL for 14 days  Glucose management indicator:   10.7 %  Time in range (70-180 mg/dL):   4 %   (Goal >16%)  Time High (181-250 mg/dL):   16 %   (Goal < 10%)  Time Very High (>250 mg/dL):    80 %   (Goal < 5%)  Time Low (54-69 mg/dL):   0 %   (Goal <9%)  Time Very Low (<54 mg/dL):   0 %   (Goal  <6%)  Coefficient of variation:   22.1 %   (Goal <36%)       Diabetes Self-Management Education - 01/27/23 1100       Health Coping   How would you rate your overall health? Good   abdominal pain, increased urination are better     Pre-Education Assessment   Patient understands using medications safely. Needs Review    Patient understands monitoring blood glucose, interpreting and using results Needs Review      Dietary Intake   Breakfast boiled egg, juice with water, bluberry yogurt    Snack (afternoon) chips    Dinner vegetables instead of starches,      Activity / Exercise   How many days per week do you exercise? 2    How many minutes per day do you exercise? 60    Total minutes per week of exercise 120      Patient Education   Previous Diabetes Education Yes (please comment)   here and mother supporting at home   Healthy Eating Role of diet in the treatment of diabetes and the relationship between the three main macronutrients and blood glucose  level   discussed lowor fat for nausea post gallblader surgery and lower carb for diabetes care   Being Active Role of exercise on diabetes management, blood pressure control and cardiac health.   discussed exercise affect on insulin resistance   Medications Reviewed patients medication for diabetes, action, purpose, timing of dose and side effects.   feel more confident in doing self injections   Acute complications Discussed and identified patients' prevention, symptoms, and treatment of hyperglycemia.      Patient Self-Evaluation of Goals - Patient rates self as meeting previously set goals (% of time)   Medications >75% (most of the time)    Monitoring >75% (most of the time)      Post-Education Assessment   Patient understands incorporating nutritional management into lifestyle. Demonstrates understanding / competency    Patient undertands incorporating physical activity into lifestyle. Demonstrates understanding / competency     Patient understands using medications safely. Demonstrates understanding / competency    Patient understands monitoring blood glucose, interpreting and using results Demonstrates understanding / competency      Outcomes   Program Status Not Completed      Subsequent Visit   Since your last visit have you continued or begun to take your medications as prescribed? Yes    Since your last visit have you had your blood pressure checked? No    Is your most recent blood pressure lower, unchanged, or higher since your last visit? N/A    Since your last visit have you experienced any weight changes? --   she did not want to weigh today   Since your last visit, are you checking your blood glucose at least once a day? Yes   remotely sharing blood sugars and using Inpen for Humalog administration            Learning Objective:  Patient will have a greater understanding of diabetes self-management. Patient education plan is to attend individual and/or group sessions per assessed needs and concerns.   Plan:   Patient Instructions  The plan to help you lower your blood sugar:  1- restart Ozempic per Dr. Heide Spark 2- today you changed your insulin sensitivity factor in your Inpen to 26.5 to give you more Humalog insulin  3- Keep eating lower carbs 4- keep being active at least every other day- track steps and aim for ???  Please follow up with a doctor in 2-3 weeks. You can follow up with me sooner as desired.   [name] [phone]    Expected Outcomes:  Demonstrated interest in learning. Expect positive outcomes  Education material provided: Diabetes Resources  If problems or questions, patient to contact team via:  Email  Future DSME appointment: - 4-6 wks, 2 wks (2-3 weeks with doctor and me if INpen adjustements needed, 4-6 weeks with me) Norm Parcel, RD 01/27/2023 11:31 AM.

## 2023-01-27 NOTE — Patient Instructions (Addendum)
The plan to help you lower your blood sugar:  1- restart Ozempic per Dr. Heide Spark 2- today you changed your insulin sensitivity factor in your Inpen to 26.5 to give you more Humalog insulin  3- Keep eating lower carbs 4- keep being active at least every other day- track steps and aim for ???  Please follow up with a doctor in 2-3 weeks. You can follow up with me sooner as desired.   [name] [phone]

## 2023-01-28 ENCOUNTER — Other Ambulatory Visit: Payer: Self-pay | Admitting: Student

## 2023-01-28 ENCOUNTER — Other Ambulatory Visit (HOSPITAL_COMMUNITY): Payer: Self-pay

## 2023-01-28 DIAGNOSIS — E118 Type 2 diabetes mellitus with unspecified complications: Secondary | ICD-10-CM

## 2023-01-28 DIAGNOSIS — E119 Type 2 diabetes mellitus without complications: Secondary | ICD-10-CM

## 2023-01-28 MED ORDER — SEMAGLUTIDE (1 MG/DOSE) 4 MG/3ML ~~LOC~~ SOPN
1.0000 mg | PEN_INJECTOR | SUBCUTANEOUS | 0 refills | Status: DC
Start: 2023-01-28 — End: 2023-03-21
  Filled 2023-01-28 – 2023-02-20 (×6): qty 3, 28d supply, fill #0

## 2023-01-28 MED ORDER — INSULIN LISPRO 100 UNIT/ML CARTRIDGE: SUBCUTANEOUS | 11 refills | Status: DC

## 2023-01-28 NOTE — Addendum Note (Signed)
Addended by: Debe Coder B on: 01/28/2023 10:33 AM   Modules accepted: Level of Service

## 2023-01-28 NOTE — Progress Notes (Signed)
Internal Medicine Clinic Attending  Case discussed with Dr. Khan  at the time of the visit.  We reviewed the resident's history and pertinent patient test results.  I agree with the assessment, diagnosis, and plan of care documented in the resident's note.  

## 2023-02-04 ENCOUNTER — Other Ambulatory Visit: Payer: Self-pay | Admitting: Internal Medicine

## 2023-02-04 ENCOUNTER — Other Ambulatory Visit (HOSPITAL_COMMUNITY): Payer: Self-pay

## 2023-02-04 DIAGNOSIS — E119 Type 2 diabetes mellitus without complications: Secondary | ICD-10-CM

## 2023-02-05 ENCOUNTER — Other Ambulatory Visit (HOSPITAL_COMMUNITY): Payer: Self-pay

## 2023-02-18 ENCOUNTER — Other Ambulatory Visit (HOSPITAL_COMMUNITY): Payer: Self-pay

## 2023-02-20 ENCOUNTER — Other Ambulatory Visit (HOSPITAL_COMMUNITY): Payer: Self-pay

## 2023-02-23 ENCOUNTER — Telehealth: Payer: Self-pay | Admitting: Dietician

## 2023-02-23 NOTE — Telephone Encounter (Signed)
Ms, Dogan called to report her blood sugars are much improved and to find out when she should follow up. She states her blood sugars are mostly under 130 with a few highs to the 167 range. Per Libreview her remote download shows Feb 10, 2023 - February 23, 2023   153  mg/dL Average Glucose;   Jan 27, 2023 - Feb 09, 2023 188 mg/dL Average Glucose  She is no longer taking any Humalog since last week, only taking Lantus 30 units daily and Ozempic weekly. She thinks she may be moving a little more more as well. Recommended follow up after July 17 and for her to try to main her current blood sugars and to call if she has questions or concerns.   Lab Results  Component Value Date   HGBA1C 8.7 (A) 01/07/2023   HGBA1C 6.6 (A) 10/15/2022   HGBA1C 6.5 (A) 05/07/2022   HGBA1C 6.5 (A) 01/20/2022   HGBA1C 5.8 (A) 08/28/2021

## 2023-02-24 ENCOUNTER — Other Ambulatory Visit (HOSPITAL_BASED_OUTPATIENT_CLINIC_OR_DEPARTMENT_OTHER): Payer: Self-pay

## 2023-02-26 ENCOUNTER — Ambulatory Visit (INDEPENDENT_AMBULATORY_CARE_PROVIDER_SITE_OTHER): Payer: Medicaid Other

## 2023-02-26 VITALS — BP 133/89 | HR 75 | Temp 98.0°F | Ht 67.0 in | Wt 228.5 lb

## 2023-02-26 DIAGNOSIS — E118 Type 2 diabetes mellitus with unspecified complications: Secondary | ICD-10-CM | POA: Diagnosis not present

## 2023-02-26 DIAGNOSIS — Z7985 Long-term (current) use of injectable non-insulin antidiabetic drugs: Secondary | ICD-10-CM

## 2023-02-26 DIAGNOSIS — I1A0 Resistant hypertension: Secondary | ICD-10-CM | POA: Diagnosis not present

## 2023-02-26 DIAGNOSIS — Z794 Long term (current) use of insulin: Secondary | ICD-10-CM | POA: Diagnosis not present

## 2023-02-26 NOTE — Patient Instructions (Addendum)
Thank you for coming to see [place] in clinic [name].  Plan:  - Please change how you are taking:     - Decrease insulin glargine to 18 units daily   - Insulin lispro 10 units with large meals (will schedule you an appointment with [name] to learn how to carb count)  - Please continue taking:     - Semaglutide 1 mg weekly  Return Precautions: If you develop nausea, lightheadedness, blurry vision, loss of consciousness, falls, please call our clinic and visit the emergency department as these can be signs of low blood sugar.    It was very nice to see you, thank you for allowing [place] to be involved in your care. We look forward to seeing you next time. Please call our clinic at [phone] if you have any questions or concerns. The best time to call is Monday-Friday from 9am-4pm, but there is someone available 24/7. If after hours or the weekend, call the main hospital number at [phone] and ask for the Internal Medicine Resident On-Call. If you need medication refills, please notify your pharmacy one week in advance and they will send [place] a request.   Please make sure to arrive 15 minutes prior to your next appointment. If you arrive late, you may be asked to reschedule.

## 2023-02-26 NOTE — Assessment & Plan Note (Signed)
Current medications include lisinopril 40 MG daily, hydrochlorothiazide 25 MG daily, amLODipine 10 MG daily. Patient states that they are compliant with these medications. Patient denies HA, lightheadedness, dizziness, CP, or SOB. Initial BP today is 133/89.    Plan: - Continue lisinopril 40 MG daily, hydrochlorothiazide 25 MG daily, amLODipine 10 MG daily

## 2023-02-26 NOTE — Assessment & Plan Note (Signed)
Current medications include insulin lispro 10 units TID w/ meals, insulin glargine 20 units daily, semaglutide 1 mg weekly. Patient states that they are compliant with these medications. Patient reports that she stopped taking her mealtime insulin 1 week ago due to her blood sugar being well-controlled. Patient does check their blood sugar at home regularly and notes values typically in the 100s. Patient reports having lows in the morning, sometimes in the 60s where she is symptomatic. Lows have continued despite stopping mealtime insulin. Patient denies polyuria, polydipsia, fatigue. Patient states that they do visit the ophthalmologist for yearly eye exams. A1c was 8.7% 1 month ago. Her CGM data demonstrates that her blood sugar has been within the target range approximately 77% of the time with 21% of values in the high and no very high, low, or very low values. It appears that patient still needs mealtime insulin with some larger meals but not every meal. Will schedule meeting with [name] to discuss carb counting so that she can determine which meals she will need to take her mealtime insulin with.   Plan: - Continue semaglutide 1 mg weekly - Continue insulin lispro 10 units but only for large meals - Decrease insulin glargine to 18 units daily due to morning lows - Will schedule meeting with [name] to learn to carb count - F/u ophthalmology  - Repeat A1c in 2 months

## 2023-02-26 NOTE — Progress Notes (Signed)
CC: f/u T2DM  HPI:  [name] is a 37 y.o. female with past medical history of HTN, T2DM, GERD, obesity, migraine, chronic nausea that presents for f/u T2DM.    Allergies as of 02/26/2023   No Known Allergies      Medication List        Accurate as of February 26, 2023 10:11 AM. If you have any questions, ask your nurse or doctor.          Accu-Chek Guide test strip Generic drug: glucose blood USE TO CHECK BLOOD SUGAR 3 TIMES DAILY   Accu-Chek Guide w/Device Kit 1 each by Does not apply route daily at 6 (six) AM. Use to check blood sugar 3 times daily   Accu-Chek Softclix Lancets lancets USE TO CHECK BLOOD SUGAR 3 TIMES DAILY   albuterol 108 (90 Base) MCG/ACT inhaler Commonly known as: Proventil HFA Inhale 1-2 puffs into the lungs every 6 (six) hours as needed for wheezing or shortness of breath.   amLODipine 10 MG tablet Commonly known as: NORVASC Take 1 tablet (10 mg total) by mouth daily.   clotrimazole-betamethasone cream Commonly known as: LOTRISONE Apply 1 Application topically daily.   FreeStyle Libre 3 Sensor Misc Place 1 sensor on the skin every 14 days. Use to check glucose continuously   hydrochlorothiazide 25 MG tablet Commonly known as: HYDRODIURIL TAKE 1 TABLET (25 MG TOTAL) BY MOUTH DAILY.   insulin lispro 100 UNIT/ML cartridge Commonly known as: HUMALOG Inject 0.1 mLs (10 Units total) into the skin 3 (three) times daily with meals. May also inject 0.02 mLs (2 Units total) as needed (For snacks).   Insulin Pen Needle 32G X 4 MM Misc Use to inject insulin at the same time each day   Insulin Pen Needle 32G X 4 MM Misc Inject 1 Needle into the skin in the morning, at noon, and at bedtime.   Lantus SoloStar 100 UNIT/ML Solostar Pen Generic drug: insulin glargine Inject 20 Units into the skin daily.   lisinopril 40 MG tablet Commonly known as: ZESTRIL TAKE 1 TABLET (40 MG TOTAL) BY MOUTH DAILY.   metoCLOPramide 10 MG tablet Commonly  known as: REGLAN TAKE 1 TABLET (10 MG TOTAL) BY MOUTH EVERY 6 (SIX) HOURS.   Ozempic (1 MG/DOSE) 4 MG/3ML Sopn Generic drug: Semaglutide (1 MG/DOSE) Inject 1 mg into the skin once a week.         Past Medical History:  Diagnosis Date   GERD (gastroesophageal reflux disease)    Hypertension    IBS (irritable bowel syndrome)    UTI (lower urinary tract infection)    Review of Systems:  per HPI.   Physical Exam: Vitals:   02/26/23 0924  BP: 133/89  Pulse: 75  Temp: 98 F (36.7 C)  TempSrc: Oral  SpO2: 100%  Weight: 228 lb 8 oz (103.6 kg)  Height: 5\' 7"  (1.702 m)   Constitutional: Well-developed, well-nourished, appears comfortable  HENT: Normocephalic and atraumatic.  Eyes: EOM are normal. PERRL.  Neck: Normal range of motion.  Cardiovascular: Regular rate, regular rhythm. No murmurs, rubs, or gallops. Normal radial and PT pulses bilaterally. No LE edema.  Pulmonary: Normal respiratory effort. No wheezes, rales, or rhonchi.   Abdominal: Soft. Non-distended. No tenderness. Normal bowel sounds.  Musculoskeletal: Normal range of motion.     Neurological: Alert and oriented to person, place, and time. Non-focal. Skin: warm and dry.    Assessment & Plan:   See Encounters Tab for problem based  charting.  DM (diabetes mellitus), type 2 with complications (HCC) Current medications include insulin lispro 10 units TID w/ meals, insulin glargine 20 units daily, semaglutide 1 mg weekly. Patient states that they are compliant with these medications. Patient reports that she stopped taking her mealtime insulin 1 week ago due to her blood sugar being well-controlled. Patient does check their blood sugar at home regularly and notes values typically in the 100s. Patient reports having lows in the morning, sometimes in the 60s where she is symptomatic. Lows have continued despite stopping mealtime insulin. Patient denies polyuria, polydipsia, fatigue. Patient states that they do visit the  ophthalmologist for yearly eye exams. A1c was 8.7% 1 month ago. Her CGM data demonstrates that her blood sugar has been within the target range approximately 77% of the time with 21% of values in the high and no very high, low, or very low values. It appears that patient still needs mealtime insulin with some larger meals but not every meal. Will schedule meeting with [name] to discuss carb counting so that she can determine which meals she will need to take her mealtime insulin with.   Plan: - Continue semaglutide 1 mg weekly - Continue insulin lispro 10 units but only for large meals - Decrease insulin glargine to 18 units daily due to morning lows - Will schedule meeting with [name] to learn to carb count - F/u ophthalmology  - Repeat A1c in 2 months  Resistant hypertension Current medications include lisinopril 40 MG daily, hydrochlorothiazide 25 MG daily, amLODipine 10 MG daily. Patient states that they are compliant with these medications. Patient denies HA, lightheadedness, dizziness, CP, or SOB. Initial BP today is 133/89.    Plan: - Continue lisinopril 40 MG daily, hydrochlorothiazide 25 MG daily, amLODipine 10 MG daily    Patient seen with Dr. Oswaldo Done

## 2023-02-27 NOTE — Progress Notes (Signed)
Internal Medicine Clinic Attending  Case discussed with Dr. Mapp  At the time of the visit.  We reviewed the resident's history and exam and pertinent patient test results.  I agree with the assessment, diagnosis, and plan of care documented in the resident's note.  

## 2023-03-05 ENCOUNTER — Ambulatory Visit: Payer: Medicaid Other

## 2023-03-06 ENCOUNTER — Other Ambulatory Visit: Payer: Self-pay

## 2023-03-06 ENCOUNTER — Ambulatory Visit (INDEPENDENT_AMBULATORY_CARE_PROVIDER_SITE_OTHER): Payer: Medicaid Other

## 2023-03-06 VITALS — BP 148/101 | HR 70 | Ht 67.0 in | Wt 226.5 lb

## 2023-03-06 DIAGNOSIS — Z3042 Encounter for surveillance of injectable contraceptive: Secondary | ICD-10-CM

## 2023-03-06 MED ORDER — MEDROXYPROGESTERONE ACETATE 150 MG/ML IM SUSY
150.0000 mg | PREFILLED_SYRINGE | Freq: Once | INTRAMUSCULAR | Status: AC
Start: 2023-03-06 — End: 2023-03-06
  Administered 2023-03-06: 150 mg via INTRAMUSCULAR

## 2023-03-06 NOTE — Progress Notes (Signed)
Raven Webb here for Depo-Provera Injection. Last dose given on 12/18/22--patient is [redacted]w[redacted]d since last dose; patient within window for next Depo dose today. Injection administered without complication. Patient will return in 3 months for next injection between 8/30 and 9/13. Next annual visit due 07/19/23.   Meryl Crutch, RN 03/06/2023  9:13 AM

## 2023-03-19 ENCOUNTER — Telehealth: Payer: Self-pay | Admitting: Dietician

## 2023-03-19 NOTE — Telephone Encounter (Signed)
Provided diabetes support. Follow up visit scheduled for July 18. Dilated eye exam schedule for 05/31/23 at Dr. Laruth Bouchard office.

## 2023-03-21 ENCOUNTER — Other Ambulatory Visit (HOSPITAL_COMMUNITY): Payer: Self-pay

## 2023-03-21 ENCOUNTER — Other Ambulatory Visit: Payer: Self-pay | Admitting: Student

## 2023-03-21 DIAGNOSIS — E119 Type 2 diabetes mellitus without complications: Secondary | ICD-10-CM

## 2023-03-21 DIAGNOSIS — E118 Type 2 diabetes mellitus with unspecified complications: Secondary | ICD-10-CM

## 2023-03-23 ENCOUNTER — Other Ambulatory Visit: Payer: Self-pay | Admitting: Student

## 2023-03-23 ENCOUNTER — Other Ambulatory Visit (HOSPITAL_COMMUNITY): Payer: Self-pay

## 2023-03-23 DIAGNOSIS — E119 Type 2 diabetes mellitus without complications: Secondary | ICD-10-CM

## 2023-03-23 DIAGNOSIS — E118 Type 2 diabetes mellitus with unspecified complications: Secondary | ICD-10-CM

## 2023-03-23 MED ORDER — HUMALOG 100 UNIT/ML ~~LOC~~ SOCT
10.0000 [IU] | Freq: Three times a day (TID) | SUBCUTANEOUS | 11 refills | Status: DC
Start: 2023-03-23 — End: 2023-10-14
  Filled 2023-03-23 (×2): qty 9, 30d supply, fill #0
  Filled 2023-06-09: qty 15, 50d supply, fill #1
  Filled 2023-06-09: qty 9, 30d supply, fill #1
  Filled 2023-08-27: qty 15, 50d supply, fill #2

## 2023-03-23 MED ORDER — OZEMPIC (1 MG/DOSE) 4 MG/3ML ~~LOC~~ SOPN
1.0000 mg | PEN_INJECTOR | SUBCUTANEOUS | 6 refills | Status: AC
Start: 2023-03-23 — End: ?
  Filled 2023-03-23: qty 3, 28d supply, fill #0
  Filled 2023-04-15 (×2): qty 3, 28d supply, fill #1
  Filled 2023-05-07 – 2023-05-13 (×4): qty 3, 28d supply, fill #2
  Filled 2023-06-09 (×2): qty 3, 28d supply, fill #3

## 2023-03-24 ENCOUNTER — Other Ambulatory Visit: Payer: Self-pay

## 2023-04-09 ENCOUNTER — Encounter: Payer: Self-pay | Admitting: Dietician

## 2023-04-15 ENCOUNTER — Other Ambulatory Visit (HOSPITAL_COMMUNITY): Payer: Self-pay

## 2023-05-06 ENCOUNTER — Encounter: Payer: Self-pay | Admitting: Dietician

## 2023-05-06 ENCOUNTER — Ambulatory Visit: Payer: Medicaid Other | Admitting: Dietician

## 2023-05-06 VITALS — Wt 232.6 lb

## 2023-05-06 DIAGNOSIS — Z7985 Long-term (current) use of injectable non-insulin antidiabetic drugs: Secondary | ICD-10-CM

## 2023-05-06 DIAGNOSIS — E118 Type 2 diabetes mellitus with unspecified complications: Secondary | ICD-10-CM

## 2023-05-06 LAB — POCT GLYCOSYLATED HEMOGLOBIN (HGB A1C): Hemoglobin A1C: 7 % — AB (ref 4.0–5.6)

## 2023-05-06 LAB — GLUCOSE, CAPILLARY: Glucose-Capillary: 164 mg/dL — ABNORMAL HIGH (ref 70–99)

## 2023-05-06 NOTE — Progress Notes (Signed)
Diabetes Self-Management Education  Visit Type: Follow-up (4th after initial)  Appt. Start Time: 1005 Appt. End Time: 1045  05/06/2023  [name], identified by name and date of birth, is a 37 y.o. female with a diagnosis of Diabetes:  .   ASSESSMENT  Weight 232 lb 9.6 oz (105.5 kg). Body mass index is 36.43 kg/m. Wt Readings from Last 10 Encounters:  05/06/23 232 lb 9.6 oz (105.5 kg)  03/06/23 226 lb 8 oz (102.7 kg)  02/26/23 228 lb 8 oz (103.6 kg)  01/19/23 228 lb 8 oz (103.6 kg)  01/07/23 228 lb 9.6 oz (103.7 kg)  12/18/22 232 lb 11.2 oz (105.6 kg)  10/15/22 237 lb 4.8 oz (107.6 kg)  10/02/22 239 lb 3.2 oz (108.5 kg)  07/17/22 234 lb 6.4 oz (106.3 kg)  05/07/22 233 lb 6.4 oz (105.9 kg)   BP Readings from Last 3 Encounters:  03/06/23 (!) 148/101  02/26/23 133/89  01/19/23 (!) 155/98   Lab Results  Component Value Date   HGBA1C 7.0 (A) 05/06/2023   HGBA1C 8.7 (A) 01/07/2023   HGBA1C 6.6 (A) 10/15/2022   HGBA1C 6.5 (A) 05/07/2022   HGBA1C 6.5 (A) 01/20/2022      Agree with patient request to increase her Ozempic to 2 mg weekly. She is doing well with caring for her diabetes. A1c 7% today down from 8.7% 3 months ago. Consider an A1c  goal of 6.5%. monitor blood pressure and consider DASH diet education. Hr weight is back to her baseline. Encourage 5-7% weight loss ~11-16#. She states that her blood sugar was in the 300s only one time after eating out.    Diabetes Self-Management Education - 05/06/23 1000       Visit Information   Visit Type Follow-up   4th after initial     Health Coping   How would you rate your overall health? Good      Complications   Last HgB A1C per patient/outside source 7 %    Have you had a dilated eye exam in the past 12 months? No   it is scheduled   Have you had a dental exam in the past 12 months? Yes    Are you checking your feet? Yes    How many days per week are you checking your feet? 7      Patient Education    Medications Reviewed patients medication for diabetes, action, purpose, timing of dose and side effects.   she takes Humalog about 1x/day. she would like to talk to doctor about increasing her Ozmepic   Monitoring Taught/evaluated CGM (comment)    Chronic complications Relationship between chronic complications and blood glucose control;Assessed and discussed foot care and prevention of foot problems;Lipid levels, blood glucose control and heart disease;Dental care;Retinopathy and reason for yearly dilated eye exams;Nephropathy, what it is, prevention of, the use of ACE, ARB's and early detection of through urine microalbumia.;Reviewed with patient heart disease, higher risk of, and prevention;Applicable immunizations      Individualized Goals (developed by patient)   Nutrition Follow meal plan discussed    Medications take my medication as prescribed    Monitoring  Consistenly use CGM      Patient Self-Evaluation of Goals - Patient rates self as meeting previously set goals (% of time)   Nutrition 25 - 50% (sometimes)    Medications >75% (most of the time)    Monitoring >75% (most of the time)      Post-Education Assessment  Patient understands incorporating nutritional management into lifestyle. Comprehends key points    Patient undertands incorporating physical activity into lifestyle. Comprehends key points    Patient understands using medications safely. Comphrehends key points    Patient understands monitoring blood glucose, interpreting and using results Comprehends key points    Patient understands prevention, detection, and treatment of acute complications. Comprehends key points    Patient understands prevention, detection, and treatment of chronic complications. Comprehends key points      Outcomes   Expected Outcomes Demonstrated interest in learning. Expect positive outcomes    Future DMSE 6 months    Program Status Completed      Subsequent Visit   Since your last visit  have you continued or begun to take your medications as prescribed? Yes    Since your last visit have you had your blood pressure checked? No    Since your last visit have you experienced any weight changes? Gain    Weight Gain (lbs) 6    Since your last visit, are you checking your blood glucose at least once a day? Yes   does not have cgm on today as she has had difficulty wit them falling off and malfuntioning. recommended and provided a trail of overpatches.            Individualized Plan for Diabetes Self-Management Training:   Learning Objective:  Patient will have a greater understanding of diabetes self-management. Patient education plan is to attend individual and/or group sessions per assessed needs and concerns.   Plan:   Patient Instructions  Thank you for your visit today!   Lab Results  Component Value Date   HGBA1C 7.0 (A) 05/06/2023   HGBA1C 8.7 (A) 01/07/2023   HGBA1C 6.6 (A) 10/15/2022   HGBA1C 6.5 (A) 05/07/2022   HGBA1C 6.5 (A) 01/20/2022     Good Job!!   groat-scheduled for  05/31/23- [phone]   Please schedule with your doctor  Follow up in 6-12 months or sooner if needed. February 2025.  [name] [phone]     Expected Outcomes:  Demonstrated interest in learning. Expect positive outcomes  Education material provided: Diabetes Resources  If problems or questions, patient to contact team via:  Phone and Email  Future DSME appointment: 6 months Norm Parcel, RD 05/06/2023 11:09 AM.

## 2023-05-06 NOTE — Patient Instructions (Addendum)
Thank you for your visit today!   Lab Results  Component Value Date   HGBA1C 7.0 (A) 05/06/2023   HGBA1C 8.7 (A) 01/07/2023   HGBA1C 6.6 (A) 10/15/2022   HGBA1C 6.5 (A) 05/07/2022   HGBA1C 6.5 (A) 01/20/2022     Good Job!!   groat-scheduled for  05/31/23- [phone]   Please schedule with your doctor  Follow up in 6-12 months or sooner if needed. February 2025.  [name] [phone]

## 2023-05-07 ENCOUNTER — Other Ambulatory Visit (HOSPITAL_COMMUNITY): Payer: Self-pay

## 2023-05-13 ENCOUNTER — Other Ambulatory Visit (HOSPITAL_COMMUNITY): Payer: Self-pay

## 2023-05-13 ENCOUNTER — Other Ambulatory Visit: Payer: Self-pay

## 2023-05-15 ENCOUNTER — Other Ambulatory Visit (HOSPITAL_COMMUNITY): Payer: Self-pay

## 2023-05-29 ENCOUNTER — Other Ambulatory Visit: Payer: Self-pay | Admitting: Dietician

## 2023-05-29 DIAGNOSIS — E118 Type 2 diabetes mellitus with unspecified complications: Secondary | ICD-10-CM

## 2023-05-29 NOTE — Progress Notes (Signed)
Order for Inpen per patient. It will be denied and then we can send a letter to [employer] that it was denied and they will sell it to her for 35.00$  [employer] asks the rx to be sent to [facility].

## 2023-06-01 ENCOUNTER — Other Ambulatory Visit: Payer: Self-pay | Admitting: Internal Medicine

## 2023-06-01 ENCOUNTER — Other Ambulatory Visit: Payer: Self-pay | Admitting: Student

## 2023-06-01 DIAGNOSIS — E119 Type 2 diabetes mellitus without complications: Secondary | ICD-10-CM

## 2023-06-01 MED ORDER — ACCU-CHEK GUIDE VI STRP: ORAL_STRIP | 11 refills | Status: DC

## 2023-06-03 ENCOUNTER — Other Ambulatory Visit: Payer: Self-pay | Admitting: Student

## 2023-06-08 ENCOUNTER — Other Ambulatory Visit: Payer: Self-pay

## 2023-06-08 ENCOUNTER — Ambulatory Visit (INDEPENDENT_AMBULATORY_CARE_PROVIDER_SITE_OTHER): Payer: Medicaid Other

## 2023-06-08 VITALS — BP 137/84 | HR 72 | Wt 228.1 lb

## 2023-06-08 DIAGNOSIS — Z3042 Encounter for surveillance of injectable contraceptive: Secondary | ICD-10-CM

## 2023-06-08 LAB — POCT PREGNANCY, URINE: Preg Test, Ur: NEGATIVE

## 2023-06-08 MED ORDER — MEDROXYPROGESTERONE ACETATE 150 MG/ML IM SUSY
150.0000 mg | PREFILLED_SYRINGE | Freq: Once | INTRAMUSCULAR | Status: AC
Start: 2023-06-08 — End: 2023-06-08
  Administered 2023-06-08: 150 mg via INTRAMUSCULAR

## 2023-06-08 NOTE — Progress Notes (Signed)
Rich Fuchs here for Depo-Provera Injection. Last injection 03/06/23 ([redacted]w[redacted]d prior). UPT today is negative. Injection administered without complication. Patient will return in 3 months for next injection between 08/24/23 and 09/07/23. Next annual visit due October 2024; offered for patient to schedule during check out.   Initial BP elevated at 139/95; recheck 137/84. Chronic hypertension managed by PCP. Patient is taking all BP meds as prescribed. Will continue to follow with PCP.  Marjo Bicker, RN 06/08/2023  2:39 PM

## 2023-06-09 ENCOUNTER — Other Ambulatory Visit: Payer: Self-pay

## 2023-06-09 ENCOUNTER — Other Ambulatory Visit (HOSPITAL_COMMUNITY): Payer: Self-pay

## 2023-06-16 ENCOUNTER — Telehealth: Payer: Self-pay

## 2023-06-16 NOTE — Telephone Encounter (Signed)
Decision:Approved Raven Webb (Key: [id number]) PA Case ID #: [id number] Need Help? Call us at [phone] Outcome Approved today by [facility] PA Case: [id number], Status: Approved, Coverage Starts on: 06/16/2023 12:00:00 AM, Coverage Ends on: 06/15/2024 12:00:00 AM. Authorization Expiration Date: 06/14/2024 Drug FreeStyle Libre 3 Sensor ePA cloud logo Form [employer] [place] [employer] [place] Form [phone] NCP[place])

## 2023-06-16 NOTE — Telephone Encounter (Signed)
Prior Authorization for patient (Freestyle Libre 3 Sensor) came through on cover my meds was submitted with last office notes and labs awaiting approval or denial.  ZOX:[id number]

## 2023-06-17 ENCOUNTER — Telehealth: Payer: Self-pay

## 2023-06-17 NOTE — Telephone Encounter (Signed)
Medicaid Managed Care   Unsuccessful Outreach Note  06/17/2023 Name: Raven Webb MRN: [id number] DOB: Oct 30, 1985  Referred by: Rocky Morel, DO Reason for referral : No chief complaint on file.   An unsuccessful telephone outreach was attempted today. The patient was referred to the case management team for assistance with care management and care coordination.   Follow Up Plan: If patient returns call to provider office, please advise to call Embedded Care Management Care Guide Raven Webb* at [phone]*  Raven Webb, CMA Care Guide [employer]

## 2023-06-19 MED ORDER — INPEN 100-PINK-LILLY-HUMALOG DEVI
0 refills | Status: DC
Start: 2023-06-19 — End: 2023-06-22

## 2023-06-22 ENCOUNTER — Ambulatory Visit (INDEPENDENT_AMBULATORY_CARE_PROVIDER_SITE_OTHER): Payer: Medicaid Other | Admitting: Student

## 2023-06-22 ENCOUNTER — Encounter: Payer: Self-pay | Admitting: Dietician

## 2023-06-22 ENCOUNTER — Other Ambulatory Visit: Payer: Self-pay

## 2023-06-22 ENCOUNTER — Encounter: Payer: Self-pay | Admitting: Student

## 2023-06-22 ENCOUNTER — Telehealth: Payer: Self-pay | Admitting: *Deleted

## 2023-06-22 ENCOUNTER — Other Ambulatory Visit: Payer: Self-pay | Admitting: Dietician

## 2023-06-22 VITALS — BP 147/111 | HR 82 | Temp 98.1°F | Ht 67.0 in | Wt 224.0 lb

## 2023-06-22 DIAGNOSIS — Z794 Long term (current) use of insulin: Secondary | ICD-10-CM | POA: Diagnosis not present

## 2023-06-22 DIAGNOSIS — E118 Type 2 diabetes mellitus with unspecified complications: Secondary | ICD-10-CM

## 2023-06-22 DIAGNOSIS — Z7985 Long-term (current) use of injectable non-insulin antidiabetic drugs: Secondary | ICD-10-CM | POA: Diagnosis not present

## 2023-06-22 DIAGNOSIS — I10 Essential (primary) hypertension: Secondary | ICD-10-CM | POA: Diagnosis not present

## 2023-06-22 DIAGNOSIS — E119 Type 2 diabetes mellitus without complications: Secondary | ICD-10-CM

## 2023-06-22 DIAGNOSIS — I1A0 Resistant hypertension: Secondary | ICD-10-CM

## 2023-06-22 MED ORDER — LANTUS SOLOSTAR 100 UNIT/ML ~~LOC~~ SOPN
36.0000 [IU] | PEN_INJECTOR | Freq: Every day | SUBCUTANEOUS | 3 refills | Status: DC
Start: 2023-06-22 — End: 2023-09-10

## 2023-06-22 MED ORDER — SPIRONOLACTONE 25 MG PO TABS
12.5000 mg | ORAL_TABLET | Freq: Every day | ORAL | 3 refills | Status: DC
Start: 2023-06-22 — End: 2023-09-10

## 2023-06-22 MED ORDER — INPEN 100-PINK-LILLY-HUMALOG DEVI
0 refills | Status: DC
Start: 2023-06-22 — End: 2023-10-14

## 2023-06-22 MED ORDER — OZEMPIC (2 MG/DOSE) 8 MG/3ML ~~LOC~~ SOPN: 2.0000 mg | PEN_INJECTOR | SUBCUTANEOUS | 3 refills | Status: DC

## 2023-06-22 MED ORDER — OZEMPIC (1 MG/DOSE) 4 MG/3ML ~~LOC~~ SOPN: 2.0000 mg | PEN_INJECTOR | SUBCUTANEOUS | 11 refills | Status: DC

## 2023-06-22 NOTE — Addendum Note (Signed)
Addended by: Lovie Macadamia on: 06/22/2023 04:08 PM   Modules accepted: Orders

## 2023-06-22 NOTE — Patient Instructions (Addendum)
  Thank you, Raven Webb, for allowing [place] to provide your care today. Today we discussed . . .  > Diabetes       -Recommend to increase your Lantus to 36 units nightly and increase your Ozempic to 2 mg weekly.  Please continue with your InPen.  We are going to have you follow-up with Raven Webb in about 2 weeks and then again with a doctor in about 1 month.  Please call us if you have any low blood sugars, nausea, abdominal pain, or sugars that are staying above 300 despite these changes. > Blood pressure       -We are going to send you for a renal artery ultrasound and a sleep study to see if there are any other causes of your still elevated blood pressure despite the medications.  I am also going to start a new medication called spironolactone at 12.5 mg daily.  We will check your electrolytes and kidney function today and then again in 2 weeks when you come to see Raven Webb.   I have ordered the following labs for you:  Lab Orders         BMP8+Anion Gap       Referrals ordered today:   Referral Orders         Ambulatory referral to Sleep Studies        I have ordered the following medication/changed the following medications:   Stop the following medications: Medications Discontinued During This Encounter  Medication Reason   insulin glargine (LANTUS SOLOSTAR) 100 UNIT/ML Solostar Pen    Semaglutide, 1 MG/DOSE, (OZEMPIC, 1 MG/DOSE,) 4 MG/3ML SOPN      Start the following medications: Meds ordered this encounter  Medications   Semaglutide, 1 MG/DOSE, (OZEMPIC, 1 MG/DOSE,) 4 MG/3ML SOPN    Sig: Inject 2 mg into the skin once a week.    Dispense:  3 mL    Refill:  11   insulin glargine (LANTUS SOLOSTAR) 100 UNIT/ML Solostar Pen    Sig: Inject 36 Units into the skin at bedtime.    Dispense:  15 mL    Refill:  3   spironolactone (ALDACTONE) 25 MG tablet    Sig: Take 0.5 tablets (12.5 mg total) by mouth daily.    Dispense:  30 tablet    Refill:  3      Follow up:  2 weeks  with Raven Webb, and 4 weeks with MD/DO     Remember:     Should you have any questions or concerns please call the internal medicine clinic at [phone].     Rocky Morel, DO [facility]

## 2023-06-22 NOTE — Progress Notes (Unsigned)
CC: Routine Follow Up   HPI:  Raven Webb is a 37 y.o. female with pertinent PMH of T2DM, HTN, GERD, and obesity who presents to the clinic for a follow-up visit. Please see assessment and plan below for further details.  Past Medical History:  Diagnosis Date   GERD (gastroesophageal reflux disease)    Hypertension    IBS (irritable bowel syndrome)    UTI (lower urinary tract infection)     Current Outpatient Medications  Medication Instructions   Accu-Chek Softclix Lancets lancets USE TO CHECK BLOOD SUGAR 3 TIMES DAILY   albuterol (PROVENTIL HFA) 108 (90 Base) MCG/ACT inhaler 1-2 puffs, Inhalation, Every 6 hours PRN   amLODipine (NORVASC) 10 mg, Oral, Daily   Blood Glucose Monitoring Suppl (ACCU-CHEK GUIDE) w/Device KIT 1 each, Does not apply, Daily, Use to check blood sugar 3 times daily   Continuous Glucose Sensor (FREESTYLE LIBRE 3 SENSOR) MISC Place 1 sensor on the skin every 14 days. Use to check glucose continuously   glucose blood (ACCU-CHEK GUIDE) test strip Use to check blood sugar 3 times daily when you do not have a Continuous glucose monitoring and when CGM tells you to use your meter   hydrochlorothiazide (HYDRODIURIL) 25 MG tablet TAKE 1 TABLET (25 MG TOTAL) BY MOUTH DAILY.   injection device for insulin (INPEN 100-PINK-LILLY-HUMALOG) DEVI Use to inject Humalog insulin as instructed.   insulin lispro (HUMALOG) 100 UNIT/ML cartridge Inject 0.1 mLs (10 Units total) into the skin 3 (three) times daily with meals. May also inject 0.02 mLs (2 Units total) as needed (For snacks).   insulin lispro (HUMALOG) 100 UNIT/ML cartridge Inject 10 Units total into the skin 3 (three) times daily with meals. To be used with Inpen.   Insulin Pen Needle 32G X 4 MM MISC Use to inject insulin at the same time each day   Insulin Pen Needle 32G X 4 MM MISC 1 Needle, Subcutaneous, 3 times daily   Lantus SoloStar 36 Units, Subcutaneous, Daily at bedtime   lisinopril (ZESTRIL) 40 MG tablet  TAKE 1 TABLET (40 MG TOTAL) BY MOUTH DAILY.   metoCLOPramide (REGLAN) 10 mg, Oral, Every 6 hours   Ozempic (2 MG/DOSE) 2 mg, Subcutaneous, Weekly   spironolactone (ALDACTONE) 12.5 mg, Oral, Daily     Review of Systems:   Pertinent items noted in HPI and/or A&P.  Physical Exam:  Vitals:   06/22/23 1049 06/22/23 1055 06/22/23 1134  BP: (!) 144/99 (!) 145/97 (!) 147/111  Pulse: 87 78 82  Temp: 98.1 F (36.7 C)    TempSrc: Oral    SpO2: 100%    Weight: 224 lb (101.6 kg)    Height: 5\' 7"  (1.702 m)      Constitutional: Well-appearing adult female. In no acute distress. HEENT: Normocephalic, atraumatic, Sclera non-icteric, PERRL, EOM intact Cardio:Regular rate and rhythm. 2+ bilateral radial pulses. Pulm:Clear to auscultation bilaterally. Normal work of breathing on room air. Abdomen: Soft, non-tender, non-distended, positive bowel sounds. NFA:OZHYQMVH for extremity edema. Skin:Warm and dry. Neuro:Alert and oriented x3. No focal deficit noted. Psych:Pleasant mood and affect.   Assessment & Plan:   Hypertension Blood pressure remains elevated today at 145/97.  She continues on amlodipine 10 mg daily, hydrochlorothiazide 25 mg daily, and lisinopril 40 mg daily.  Renin and aldosterone levels/ratio from 2022 was normal and no persistent hypokalemia, she has not yet had a renal ultrasound for workup of renal artery stenosis, she has not had a sleep study either.  Today we  will add spironolactone and send her for a renal artery ultrasound as well as a sleep study for further evaluation of a young female with poorly controlled hypertension on 3 medications. - Start spironolactone 12.5 mg daily and send for renal artery ultrasound and sleep study - Continue amlodipine 10 mg daily, hydrochlorothiazide 25 mg daily, and lisinopril 40 mg daily - BMP today, return in 2 weeks to see diabetes coordinator and repeat BMP at that time as well.  DM (diabetes mellitus), type 2 with complications  (HCC) A1c 7.0 on 05/06/2023.  Albumin creatinine ratio normal on 10/15/2022.  She continues on Lantus 30 units nightly, InPen using roughly 10 units 4-5 times a day, and Ozempic 1 mg weekly which she has been on for at least 2 months.  She brings in her Accu-Chek device which shows average blood glucose of 354 with her lowest being 156.  We discussed increasing Ozempic to 2 mg weekly and Lantus to 36 units nightly which she agreed to.  (Addendum: After our visit she called the clinic and would like to remain on 1 mg weekly but after discussing further on the phone she is agreeable to trying the 2 mg dose. - Increase Lantus to 36 units nightly, InPen, and increase Ozempic to 2 mg weekly - Return in 2 weeks for eating with diabetic coordinator    Patient discussed with Dr. Duwayne Heck, [facility] Internal Medicine Resident PGY-2 Clinic Phone: [phone] Pager: [phone]

## 2023-06-22 NOTE — Telephone Encounter (Signed)
Prescription was sent over for 1 mg dose weekly and patient's direction says for 2 mg dose. Need new prescription sent.

## 2023-06-22 NOTE — Addendum Note (Signed)
Addended by: Baird Cancer on: 06/22/2023 04:04 PM   Modules accepted: Orders

## 2023-06-22 NOTE — Telephone Encounter (Signed)
Pharmacy says her Inpen was rejected ( this allows [place] to send a letter to [employer] and she will be offered the Inpen for 35$)    She also says pharmacy stated that we sent in the incorrect Ozempic pen if we want her to increase her dose to 2 mg weekly. She needs the 8 mg/3ML pen.

## 2023-06-23 LAB — BMP8+ANION GAP
Anion Gap: 15 mmol/L (ref 10.0–18.0)
BUN/Creatinine Ratio: 9 (ref 9–23)
BUN: 9 mg/dL (ref 6–20)
CO2: 25 mmol/L (ref 20–29)
Calcium: 10.3 mg/dL — ABNORMAL HIGH (ref 8.7–10.2)
Chloride: 99 mmol/L (ref 96–106)
Creatinine, Ser: 0.95 mg/dL (ref 0.57–1.00)
Glucose: 153 mg/dL — ABNORMAL HIGH (ref 70–99)
Potassium: 3.9 mmol/L (ref 3.5–5.2)
Sodium: 139 mmol/L (ref 134–144)
eGFR: 80 mL/min/{1.73_m2} (ref >59)

## 2023-06-24 ENCOUNTER — Encounter: Payer: Self-pay | Admitting: Student

## 2023-06-24 MED ORDER — AMLODIPINE BESYLATE 10 MG PO TABS
10.0000 mg | ORAL_TABLET | Freq: Every day | ORAL | 3 refills | Status: DC
Start: 2023-06-24 — End: 2023-10-14

## 2023-06-25 NOTE — Assessment & Plan Note (Addendum)
A1c 7.0 on 05/06/2023.  Albumin creatinine ratio normal on 10/15/2022.  She continues on Lantus 30 units nightly, InPen using roughly 10 units 4-5 times a day, and Ozempic 1 mg weekly which she has been on for at least 2 months.  She brings in her Accu-Chek device which shows average blood glucose of 354 with her lowest being 156.  We discussed increasing Ozempic to 2 mg weekly and Lantus to 36 units nightly which she agreed to.  (Addendum: After our visit she called the clinic and would like to remain on 1 mg weekly but after discussing further on the phone she is agreeable to trying the 2 mg dose. - Increase Lantus to 36 units nightly, InPen, and increase Ozempic to 2 mg weekly - Return in 2 weeks for eating with diabetic coordinator

## 2023-06-25 NOTE — Assessment & Plan Note (Signed)
Blood pressure remains elevated today at 145/97.  She continues on amlodipine 10 mg daily, hydrochlorothiazide 25 mg daily, and lisinopril 40 mg daily.  Renin and aldosterone levels/ratio from 2022 was normal and no persistent hypokalemia, she has not yet had a renal ultrasound for workup of renal artery stenosis, she has not had a sleep study either.  Today we will add spironolactone and send her for a renal artery ultrasound as well as a sleep study for further evaluation of a young female with poorly controlled hypertension on 3 medications. - Start spironolactone 12.5 mg daily and send for renal artery ultrasound and sleep study - Continue amlodipine 10 mg daily, hydrochlorothiazide 25 mg daily, and lisinopril 40 mg daily - BMP today, return in 2 weeks to see diabetes coordinator and repeat BMP at that time as well.

## 2023-06-25 NOTE — Progress Notes (Signed)
Discussed BMP with patient over the phone and mildly elevated calcium.  Also discussed increasing Ozempic to 2 mg which she is still agreeable to do and will let us know if she has any significant side effects with the increase.  Discussed plan for repeat BMP in 2 weeks when she comes to see our diabetic coordinator.  If calcium still elevated at that time or if having hyperkalemia after starting spironolactone we will discuss further workup and/or medication changes.

## 2023-06-26 NOTE — Progress Notes (Signed)
Internal Medicine Clinic Attending  Case discussed with the resident at the time of the visit.  We reviewed the resident's history and exam and pertinent patient test results.  I agree with the assessment, diagnosis, and plan of care documented in the resident's note.  

## 2023-07-06 ENCOUNTER — Encounter: Payer: Self-pay | Admitting: Student

## 2023-07-06 DIAGNOSIS — E118 Type 2 diabetes mellitus with unspecified complications: Secondary | ICD-10-CM

## 2023-07-06 MED ORDER — SEMAGLUTIDE (1 MG/DOSE) 4 MG/3ML ~~LOC~~ SOPN
1.0000 mg | PEN_INJECTOR | SUBCUTANEOUS | 3 refills | Status: DC
Start: 2023-07-06 — End: 2023-10-17
  Filled 2023-07-06 – 2023-07-08 (×3): qty 3, 28d supply, fill #0
  Filled 2023-07-31: qty 3, 28d supply, fill #1
  Filled 2023-08-27: qty 3, 28d supply, fill #2
  Filled 2023-09-24 – 2023-09-25 (×3): qty 3, 28d supply, fill #3

## 2023-07-07 ENCOUNTER — Other Ambulatory Visit (HOSPITAL_COMMUNITY): Payer: Self-pay

## 2023-07-08 ENCOUNTER — Encounter: Payer: Self-pay | Admitting: Student

## 2023-07-08 ENCOUNTER — Other Ambulatory Visit (HOSPITAL_COMMUNITY): Payer: Self-pay

## 2023-07-08 NOTE — Telephone Encounter (Signed)
Called and spoke with the patient about her high glucose levels.  At her last visit on 06/25/2023 she was increased from 30 units of glargine to 36 units of glargine at night and continued on InPen and Ozempic 1 mg weekly which she takes on Fridays.  Since then her morning sugars have remained in the 300s which is consistent with why we increased her long-acting insulin at her visit.  She has not run out of insulin.  This morning her glucose was around 350 and for breakfast she ate a banana and some toast and took 10 units of insulin through her InPen.  For lunch she had rice and general/chicken and again used 10 units of insulin with her InPen.  Since then her glucose has measured HI.  She has checked this 4 times.  Her last insulin was given at about 2 PM.  She is feeling okay at the moment.  We discussed return precautions if the below changes do not improve her sugars and she starts to feel poorly. - Additional 5 units of short acting insulin now and increase glargine to 40 units nightly - I will call the patient again in the morning to reevaluate

## 2023-07-09 ENCOUNTER — Ambulatory Visit: Payer: Medicaid Other | Admitting: Student

## 2023-07-09 DIAGNOSIS — Z7985 Long-term (current) use of injectable non-insulin antidiabetic drugs: Secondary | ICD-10-CM | POA: Diagnosis not present

## 2023-07-09 DIAGNOSIS — Z794 Long term (current) use of insulin: Secondary | ICD-10-CM | POA: Diagnosis not present

## 2023-07-09 DIAGNOSIS — E118 Type 2 diabetes mellitus with unspecified complications: Secondary | ICD-10-CM | POA: Diagnosis not present

## 2023-07-09 NOTE — Assessment & Plan Note (Addendum)
Please see phone conversation from yesterday for further details but this is a telephone follow-up visit for elevated blood glucose levels.  She was having multiple high readings despite [place] increasing her insulin regimen at her last office visit.  Yesterday she took an extra 5 units of Humalog and we increased her Lantus from 36 units nightly to 40 units nightly.  After these changes her sugars came down to the low 400s about 3 hours later and then 2 hours after that her sugar was at 118.  She did not feel any hypoglycemic symptoms.  Her morning blood sugar was in the 200s but she was unable to access it on her freestyle libre CGM to get the exact number.  She did not take any more short acting insulin after taking the 5 units of additional short acting yesterday afternoon.  Discussed possibility of pregnancy and patient states that she is on Depo-Provera with her last injection 06/08/2023.  She does not recall her last menstrual period and is not sexually active.  Plan will be to continue with current regimen and she will see our diabetes coordinator on 07/21/2023.  She is instructed to call our clinic early next week if she is having morning blood sugars still in the 300s or any low blood sugar readings.  Otherwise she will review her CGM data with [name] and we will make changes based on this. - Continue with increased Lantus of 40 units nightly - Continue 10 units mealtime Humalog - Continue Ozempic 1 mg weekly - Pregnancy test at next visit - Keep follow-up with diabetes coordinator on 07/21/2023 - Set up appointment to see MD/DO in the next 2-4 weeks

## 2023-07-09 NOTE — Progress Notes (Signed)
  [facility] Telephone Encounter Continuity Care Appointment  HPI:  This telephone encounter was created for Raven Webb on 07/09/2023 for the following purpose/cc elevated blood glucose.   Past Medical History:  Past Medical History:  Diagnosis Date   GERD (gastroesophageal reflux disease)    Hypertension    IBS (irritable bowel syndrome)    UTI (lower urinary tract infection)      ROS:  Please see assessment and plan.   Assessment / Plan / Recommendations:  DM (diabetes mellitus), type 2 with complications (HCC) Please see phone conversation from yesterday for further details but this is a telephone follow-up visit for elevated blood glucose levels.  She was having multiple high readings despite [place] increasing her insulin regimen at her last office visit.  Yesterday she took an extra 5 units of Humalog and we increased her Lantus from 36 units nightly to 40 units nightly.  After these changes her sugars came down to the low 400s about 3 hours later and then 2 hours after that her sugar was at 118.  She did not feel any hypoglycemic symptoms.  Her morning blood sugar was in the 200s but she was unable to access it on her freestyle libre CGM to get the exact number.  She did not take any more short acting insulin after taking the 5 units of additional short acting yesterday afternoon.  Discussed possibility of pregnancy and patient states that she is on Depo-Provera with her last injection 06/08/2023.  She does not recall her last menstrual period and is not sexually active.  Plan will be to continue with current regimen and she will see our diabetes coordinator on 07/21/2023.  She is instructed to call our clinic early next week if she is having morning blood sugars still in the 300s or any low blood sugar readings.  Otherwise she will review her CGM data with [name] and we will make changes based on this. - Continue with increased Lantus of 40 units nightly -  Continue 10 units mealtime Humalog - Continue Ozempic 1 mg weekly - Pregnancy test at next visit - Keep follow-up with diabetes coordinator on 07/21/2023 - Set up appointment to see MD/DO in the next 2-4 weeks    As always, pt is advised that if symptoms worsen or new symptoms arise, they should go to an urgent care facility or to to ER for further evaluation.   Consent and Medical Decision Making:  Patient discussed with Dr. Oswaldo Done This is a telephone encounter between Raven Webb and Rocky Morel on 07/09/2023 for hyperglycemia. The visit was conducted with the patient located at home and Rocky Morel at [facility]. The patient's identity was confirmed using their DOB and current address. The patient has consented to being evaluated through a telephone encounter and understands the associated risks (an examination cannot be done and the patient may need to come in for an appointment) / benefits (allows the patient to remain at home, decreasing exposure to coronavirus). I personally spent 15 minutes on medical discussion.

## 2023-07-10 NOTE — Progress Notes (Signed)
Internal Medicine Clinic Attending  Case discussed with the resident physician at the time of the visit.  We reviewed the patient's history and pertinent patient test results.  I agree with the assessment, diagnosis, and plan of care documented in the resident's note.

## 2023-07-10 NOTE — Addendum Note (Signed)
Addended by: Rocky Morel on: 07/10/2023 11:35 AM   Modules accepted: Level of Service

## 2023-07-13 ENCOUNTER — Other Ambulatory Visit: Payer: Self-pay | Admitting: Dietician

## 2023-07-13 ENCOUNTER — Other Ambulatory Visit (HOSPITAL_COMMUNITY): Payer: Self-pay

## 2023-07-13 ENCOUNTER — Telehealth: Payer: Self-pay

## 2023-07-13 ENCOUNTER — Encounter: Payer: Self-pay | Admitting: Student

## 2023-07-13 DIAGNOSIS — E118 Type 2 diabetes mellitus with unspecified complications: Secondary | ICD-10-CM

## 2023-07-13 DIAGNOSIS — E119 Type 2 diabetes mellitus without complications: Secondary | ICD-10-CM

## 2023-07-13 MED ORDER — FREESTYLE LIBRE 3 PLUS SENSOR MISC: 3 refills | Status: DC

## 2023-07-13 MED ORDER — FREESTYLE LIBRE 3 SENSOR MISC
1.0000 | 11 refills | Status: DC
  Filled 2023-07-13 (×2): qty 2, 28d supply, fill #0

## 2023-07-13 MED ORDER — FREESTYLE LIBRE 3 PLUS SENSOR MISC
1.0000 | 11 refills | Status: DC
  Filled 2023-07-13: qty 2, 30d supply, fill #0

## 2023-07-13 NOTE — Telephone Encounter (Signed)
[facility] asked her to get a new prescription for CGM sensors. She would like to get it as soon as possibnl as she is out.

## 2023-07-13 NOTE — Telephone Encounter (Signed)
Prior Authorization for patient (Freestyle Libre 3 Plus Sensor) came through on cover my meds was submitted with last office notes and labs awaiting approval or denial.  KEY: [id number]

## 2023-07-14 NOTE — Telephone Encounter (Signed)
Decision:Denied  This is a courtesy notification to advise you that we do not cover the above-requested  medication under this member's benefit plan.  Called and spoke with the patient she is aware of the denial.

## 2023-07-18 ENCOUNTER — Telehealth: Payer: Medicaid Other | Admitting: Family Medicine

## 2023-07-18 DIAGNOSIS — H00012 Hordeolum externum right lower eyelid: Secondary | ICD-10-CM

## 2023-07-18 MED ORDER — OLOPATADINE HCL 0.1 % OP SOLN
1.0000 [drp] | Freq: Two times a day (BID) | OPHTHALMIC | 0 refills | Status: AC
Start: 2023-07-18 — End: 2023-07-28

## 2023-07-18 NOTE — Patient Instructions (Signed)
Raven Webb, thank you for joining Reed Pandy, PA-C for today's virtual visit.  While this provider is not your primary care provider (PCP), if your PCP is located in our provider database this encounter information will be shared with them immediately following your visit.   A [facility] MyChart account gives you access to today's visit and all your visits, tests, and labs performed at [facility] " click here if you don't have a [facility] MyChart account or go to [email or web address]  Consent: (Patient) Raven Webb provided verbal consent for this virtual visit at the beginning of the encounter.  Current Medications:  Current Outpatient Medications:    olopatadine (CVS OLOPATADINE HCL) 0.1 % ophthalmic solution, Place 1 drop into the right eye 2 (two) times daily for 10 days., Disp: 1 mL, Rfl: 0   Accu-Chek Softclix Lancets lancets, USE TO CHECK BLOOD SUGAR 3 TIMES DAILY, Disp: 100 each, Rfl: 12   albuterol (PROVENTIL HFA) 108 (90 Base) MCG/ACT inhaler, Inhale 1-2 puffs into the lungs every 6 (six) hours as needed for wheezing or shortness of breath., Disp: 18 g, Rfl: 0   amLODipine (NORVASC) 10 MG tablet, Take 1 tablet (10 mg total) by mouth daily., Disp: 90 tablet, Rfl: 3   Blood Glucose Monitoring Suppl (ACCU-CHEK GUIDE) w/Device KIT, 1 each by Does not apply route daily at 6 (six) AM. Use to check blood sugar 3 times daily, Disp: 1 kit, Rfl: 2   Continuous Glucose Sensor (FREESTYLE LIBRE 3 PLUS SENSOR) MISC, Change sensor every 15 days. Use to monitor your blood sugar continuously., Disp: 6 each, Rfl: 3   Continuous Glucose Sensor (FREESTYLE LIBRE 3 SENSOR) MISC, Place 1 sensor on the skin, changing every 14 days, to check glucose continuously, Disp: 2 each, Rfl: 11   glucose blood (ACCU-CHEK GUIDE) test strip, Use to check blood sugar 3 times daily when you do not have a Continuous glucose monitoring and when CGM tells you to use your meter, Disp: 100 each, Rfl:  11   hydrochlorothiazide (HYDRODIURIL) 25 MG tablet, TAKE 1 TABLET (25 MG TOTAL) BY MOUTH DAILY., Disp: 90 tablet, Rfl: 1   injection device for insulin (INPEN 100-PINK-LILLY-HUMALOG) DEVI, Use to inject Humalog insulin as instructed., Disp: 1 each, Rfl: 0   insulin glargine (LANTUS SOLOSTAR) 100 UNIT/ML Solostar Pen, Inject 36 Units into the skin at bedtime., Disp: 15 mL, Rfl: 3   insulin lispro (HUMALOG) 100 UNIT/ML cartridge, Inject 0.1 mLs (10 Units total) into the skin 3 (three) times daily with meals. May also inject 0.02 mLs (2 Units total) as needed (For snacks)., Disp: 15 mL, Rfl: 11   insulin lispro (HUMALOG) 100 UNIT/ML cartridge, Inject 10 Units total into the skin 3 (three) times daily with meals. To be used with Inpen., Disp: 9 mL, Rfl: 11   Insulin Pen Needle 32G X 4 MM MISC, Use to inject insulin at the same time each day, Disp: 100 each, Rfl: 3   Insulin Pen Needle 32G X 4 MM MISC, Inject 1 Needle into the skin in the morning, at noon, and at bedtime., Disp: 100 each, Rfl: 11   lisinopril (ZESTRIL) 40 MG tablet, TAKE 1 TABLET (40 MG TOTAL) BY MOUTH DAILY., Disp: 90 tablet, Rfl: 1   medroxyPROGESTERone (DEPO-PROVERA) 150 MG/ML injection, Inject 150 mg into the muscle every 3 (three) months., Disp: , Rfl:    metoCLOPramide (REGLAN) 10 MG tablet, TAKE 1 TABLET (10 MG TOTAL) BY MOUTH EVERY 6 (SIX) HOURS., Disp: 60  tablet, Rfl: 0   Semaglutide, 1 MG/DOSE, 4 MG/3ML SOPN, Inject 1 mg into the skin once a week., Disp: 3 mL, Rfl: 3   spironolactone (ALDACTONE) 25 MG tablet, Take 0.5 tablets (12.5 mg total) by mouth daily., Disp: 30 tablet, Rfl: 3   Medications ordered in this encounter:  Meds ordered this encounter  Medications   olopatadine (CVS OLOPATADINE HCL) 0.1 % ophthalmic solution    Sig: Place 1 drop into the right eye 2 (two) times daily for 10 days.    Dispense:  1 mL    Refill:  0     *If you need refills on other medications prior to your next appointment, please contact  your pharmacy*  Follow-Up: Call back or seek an in-person evaluation if the symptoms worsen or if the condition fails to improve as anticipated.  [facility] [phone]  Other Instructions Stye A stye, also known as a hordeolum, is a bump that forms on an eyelid. It may look like a pimple next to the eyelash. A stye can form inside the eyelid (internal stye) or outside the eyelid (external stye). A stye can cause redness, swelling, and pain on the eyelid. Styes are very common. Anyone can get them at any age. They usually occur in just one eye at a time, but you may have more than one in either eye. What are the causes? A stye is caused by an infection. The infection is almost always caused by bacteria called Staphylococcus aureus. This is a common type of bacteria that lives on the skin. An internal stye may result from an infected oil-producing gland inside the eyelid. An external stye may be caused by an infection at the base of the eyelash (hair follicle). What increases the risk? You are more likely to develop a stye if: You have had a stye before. You have any of these conditions: Red, itchy, inflamed eyelids (blepharitis). A skin condition such as seborrheic dermatitis or rosacea. High fat levels in your blood (lipids). Dry eyes. What are the signs or symptoms? The most common symptom of a stye is eyelid pain. Internal styes are more painful than external styes. Other symptoms may include: Painful swelling of your eyelid. A scratchy feeling in your eye. Tearing and redness of your eye. A pimple-like bump on the edge of the eyelid. Pus draining from the stye. How is this diagnosed? Your health care provider may be able to diagnose a stye just by examining your eye. The health care provider may also check to make sure: You do not have a fever or other signs of a more serious infection. The infection has not spread to other parts of your eye or areas around your  eye. How is this treated? Most styes will clear up in a few days without treatment or with warm compresses applied to the area. You may need to use antibiotic drops or ointment to treat an infection. Sometimes, steroid drops or ointment are used in addition to antibiotics. In some cases, your health care provider may give you a small steroid injection in the eyelid. If your stye does not heal with routine treatment, your health care provider may drain pus from the stye using a thin blade or needle. This may be done if the stye is large, causing a lot of pain, or affecting your vision. Follow these instructions at home: Take over-the-counter and prescription medicines only as told by your health care provider. This includes eye drops or ointments.  If you were prescribed an antibiotic medicine, steroid medicine, or both, apply or use them as told by your health care provider. Do not stop using the medicine even if your condition improves. Apply a warm, wet cloth (warm compress) to your eye for 5-10 minutes, 4 to 6 times a day. Clean the affected eyelid as directed by your health care provider. Do not wear contact lenses or eye makeup until your stye has healed and your health care provider says that it is safe. Do not try to pop or drain the stye. Do not rub your eye. Contact a health care provider if: You have chills or a fever. Your stye does not go away after several days. Your stye affects your vision. Your eyeball becomes swollen, red, or painful. Get help right away if: You have pain when moving your eye around. Summary A stye is a bump that forms on an eyelid. It may look like a pimple next to the eyelash. A stye can form inside the eyelid (internal stye) or outside the eyelid (external stye). A stye can cause redness, swelling, and pain on the eyelid. Your health care provider may be able to diagnose a stye just by examining your eye. Apply a warm, wet cloth (warm compress) to your eye  for 5-10 minutes, 4 to 6 times a day. This information is not intended to replace advice given to you by your health care provider. Make sure you discuss any questions you have with your health care provider. Document Revised: 11/14/2020 Document Reviewed: 11/14/2020 Elsevier Patient Education  2024 Elsevier Inc.    If you have been instructed to have an in-person evaluation today at a local Urgent Care facility, please use the link below. It will take you to a list of all of our available [facility], including address, phone number and hours of operation. Please do not delay care.  [facility]  If you or a family member do not have a primary care provider, use the link below to schedule a visit and establish care. When you choose a [facility] primary care physician or advanced practice provider, you gain a long-term partner in health. Find a Primary Care Provider  Learn more about [facility]'s in-office and virtual care options: [facility] - Get Care Now

## 2023-07-18 NOTE — Progress Notes (Signed)
Virtual Visit Consent   Raven Webb, you are scheduled for a virtual visit with a  provider today. Just as with appointments in the office, your consent must be obtained to participate. Your consent will be active for this visit and any virtual visit you may have with one of our providers in the next 365 days. If you have a MyChart account, a copy of this consent can be sent to you electronically.  As this is a virtual visit, video technology does not allow for your provider to perform a traditional examination. This may limit your provider's ability to fully assess your condition. If your provider identifies any concerns that need to be evaluated in person or the need to arrange testing (such as labs, EKG, etc.), we will make arrangements to do so. Although advances in technology are sophisticated, we cannot ensure that it will always work on either your end or our end. If the connection with a video visit is poor, the visit may have to be switched to a telephone visit. With either a video or telephone visit, we are not always able to ensure that we have a secure connection.  By engaging in this virtual visit, you consent to the provision of healthcare and authorize for your insurance to be billed (if applicable) for the services provided during this visit. Depending on your insurance coverage, you may receive a charge related to this service.  I need to obtain your verbal consent now. Are you willing to proceed with your visit today? Raven Webb has provided verbal consent on 07/18/2023 for a virtual visit (video or telephone). Reed Pandy, [place]  Date: 07/18/2023 3:06 PM  Virtual Visit via Video Note   I, Reed Pandy, connected with  Raven Webb  ([id number], 1986/07/17) on 07/18/23 at  3:00 PM EDT by a video-enabled telemedicine application and verified that I am speaking with the correct person using two identifiers.  Location: Patient: Virtual Visit Location Patient:  Home Provider: Virtual Visit Location Provider: Home Office   I discussed the limitations of evaluation and management by telemedicine and the availability of in person appointments. The patient expressed understanding and agreed to proceed.    History of Present Illness: Raven Webb is a 37 y.o. who identifies as a female who was assigned female at birth, and is being seen today for c/o stye inside of her right eye.  Wants to know if there is anything she can use for the stye. Pt states eye is itchy.  Pt states is trying the warm compresses.    HPI: HPI  Problems:  Patient Active Problem List   Diagnosis Date Noted   DM (diabetes mellitus), type 2 with complications (HCC) 08/08/2019   Obesity (BMI 35.0-39.9 without comorbidity) 04/27/2018   Migraine 05/06/2017   GERD (gastroesophageal reflux disease) 03/31/2013   Chronic nausea 02/24/2013   Healthcare maintenance 01/25/2013   Hypertension 03/13/2008    Allergies: No Known Allergies Medications:  Current Outpatient Medications:    olopatadine (CVS OLOPATADINE HCL) 0.1 % ophthalmic solution, Place 1 drop into the right eye 2 (two) times daily for 10 days., Disp: 1 mL, Rfl: 0   Accu-Chek Softclix Lancets lancets, USE TO CHECK BLOOD SUGAR 3 TIMES DAILY, Disp: 100 each, Rfl: 12   albuterol (PROVENTIL HFA) 108 (90 Base) MCG/ACT inhaler, Inhale 1-2 puffs into the lungs every 6 (six) hours as needed for wheezing or shortness of breath., Disp: 18 g, Rfl: 0   amLODipine (NORVASC) 10  MG tablet, Take 1 tablet (10 mg total) by mouth daily., Disp: 90 tablet, Rfl: 3   Blood Glucose Monitoring Suppl (ACCU-CHEK GUIDE) w/Device KIT, 1 each by Does not apply route daily at 6 (six) AM. Use to check blood sugar 3 times daily, Disp: 1 kit, Rfl: 2   Continuous Glucose Sensor (FREESTYLE LIBRE 3 PLUS SENSOR) MISC, Change sensor every 15 days. Use to monitor your blood sugar continuously., Disp: 6 each, Rfl: 3   Continuous Glucose Sensor (FREESTYLE LIBRE 3  SENSOR) MISC, Place 1 sensor on the skin, changing every 14 days, to check glucose continuously, Disp: 2 each, Rfl: 11   glucose blood (ACCU-CHEK GUIDE) test strip, Use to check blood sugar 3 times daily when you do not have a Continuous glucose monitoring and when CGM tells you to use your meter, Disp: 100 each, Rfl: 11   hydrochlorothiazide (HYDRODIURIL) 25 MG tablet, TAKE 1 TABLET (25 MG TOTAL) BY MOUTH DAILY., Disp: 90 tablet, Rfl: 1   injection device for insulin (INPEN 100-PINK-LILLY-HUMALOG) DEVI, Use to inject Humalog insulin as instructed., Disp: 1 each, Rfl: 0   insulin glargine (LANTUS SOLOSTAR) 100 UNIT/ML Solostar Pen, Inject 36 Units into the skin at bedtime., Disp: 15 mL, Rfl: 3   insulin lispro (HUMALOG) 100 UNIT/ML cartridge, Inject 0.1 mLs (10 Units total) into the skin 3 (three) times daily with meals. May also inject 0.02 mLs (2 Units total) as needed (For snacks)., Disp: 15 mL, Rfl: 11   insulin lispro (HUMALOG) 100 UNIT/ML cartridge, Inject 10 Units total into the skin 3 (three) times daily with meals. To be used with Inpen., Disp: 9 mL, Rfl: 11   Insulin Pen Needle 32G X 4 MM MISC, Use to inject insulin at the same time each day, Disp: 100 each, Rfl: 3   Insulin Pen Needle 32G X 4 MM MISC, Inject 1 Needle into the skin in the morning, at noon, and at bedtime., Disp: 100 each, Rfl: 11   lisinopril (ZESTRIL) 40 MG tablet, TAKE 1 TABLET (40 MG TOTAL) BY MOUTH DAILY., Disp: 90 tablet, Rfl: 1   medroxyPROGESTERone (DEPO-PROVERA) 150 MG/ML injection, Inject 150 mg into the muscle every 3 (three) months., Disp: , Rfl:    metoCLOPramide (REGLAN) 10 MG tablet, TAKE 1 TABLET (10 MG TOTAL) BY MOUTH EVERY 6 (SIX) HOURS., Disp: 60 tablet, Rfl: 0   Semaglutide, 1 MG/DOSE, 4 MG/3ML SOPN, Inject 1 mg into the skin once a week., Disp: 3 mL, Rfl: 3   spironolactone (ALDACTONE) 25 MG tablet, Take 0.5 tablets (12.5 mg total) by mouth daily., Disp: 30 tablet, Rfl: 3  Observations/Objective: Patient  is well-developed, well-nourished in no acute distress.  Resting comfortably at home.  Head is normocephalic, atraumatic.  No labored breathing.  Speech is clear and coherent with logical content.  Patient is alert and oriented at baseline.    Assessment and Plan: 1. Hordeolum externum of right lower eyelid - olopatadine (CVS OLOPATADINE HCL) 0.1 % ophthalmic solution; Place 1 drop into the right eye 2 (two) times daily for 10 days.  Dispense: 1 mL; Refill: 0  -Continue warm compresses -Send [place] for itchiness -Advised Pt to follow up with ophthalmologist for continued symptoms   Follow Up Instructions: I discussed the assessment and treatment plan with the patient. The patient was provided an opportunity to ask questions and all were answered. The patient agreed with the plan and demonstrated an understanding of the instructions.  A copy of instructions were sent to the patient  via MyChart unless otherwise noted below.     The patient was advised to call back or seek an in-person evaluation if the symptoms worsen or if the condition fails to improve as anticipated.    Reed Pandy, PA-C

## 2023-07-21 ENCOUNTER — Encounter: Payer: Self-pay | Admitting: Dietician

## 2023-07-27 ENCOUNTER — Encounter: Payer: Self-pay | Admitting: Dietician

## 2023-07-28 ENCOUNTER — Telehealth: Payer: Self-pay | Admitting: Dietician

## 2023-07-28 ENCOUNTER — Ambulatory Visit (INDEPENDENT_AMBULATORY_CARE_PROVIDER_SITE_OTHER): Payer: Medicaid Other | Admitting: Dietician

## 2023-07-28 DIAGNOSIS — E118 Type 2 diabetes mellitus with unspecified complications: Secondary | ICD-10-CM | POA: Diagnosis not present

## 2023-07-28 NOTE — Patient Instructions (Addendum)
Thank you for your visit today!  We talked about:   I have emailed the rep for [employer] Omnipod insulin pump  [name] to call [employer] and [employer] repo to find out what happened to your inpen order.  Goals to work on:   Diabetes eye exam Dr. Laruth Bouchard office 07-31-23 Get in touch with [employer] about your INpen [phone]  Look up Omnipod insulin pump and rad up on it. I will try to find someone for you to talk to about it.   Your goal is to lower blood sugars:  1-  Increase activity to help lower blood sugar 2-  Begin carb counting as we discussed with new settings in your INpen.  Please feel free to call me anytime.  [name] [phone]

## 2023-07-28 NOTE — Telephone Encounter (Signed)
Patient request assistance from [facility] (Diabetes Educator) for diabetes elf care and Raven Webb  for increased stress request referrals.

## 2023-07-28 NOTE — Progress Notes (Addendum)
Wt Readings from Last 10 Encounters:  06/22/23 224 lb (101.6 kg)  06/08/23 228 lb 1.6 oz (103.5 kg)  05/06/23 232 lb 9.6 oz (105.5 kg)  03/06/23 226 lb 8 oz (102.7 kg)  02/26/23 228 lb 8 oz (103.6 kg)  01/19/23 228 lb 8 oz (103.6 kg)  01/07/23 228 lb 9.6 oz (103.7 kg)  12/18/22 232 lb 11.2 oz (105.6 kg)  10/15/22 237 lb 4.8 oz (107.6 kg)  10/02/22 239 lb 3.2 oz (108.5 kg)   BP Readings from Last 3 Encounters:  06/22/23 (!) 147/111  06/08/23 137/84  03/06/23 (!) 148/101   Diabetes Self-Management Education  Visit Type: Follow-up (5th after initial)  Appt. Start Time: 0835 Appt. End Time: 920  07/28/2023  [name], identified by name and date of birth, is a 37 y.o. female with a diagnosis of Diabetes:  .   ASSESSMENT This was an in person visit. Patient was scheduled for a phone visit but came in to the office. Raven Webb is here today for diabetes follow up. She specifically wants help with her high blood sugar. She wants then to be less than 200 mg/dL.  She states that she is taking her diabetes Meds as directed. She dislikes injections and has not noticed a change in her appetite despite maximal dose semaglutide weekly. We tried to update her Inpen app setting as she now wants to use carb counting and needs lower sensitivity and carb ratios. However, [employer] would not allow these to be changed without their approval first. We also follow up on her new iNpen order. Introduced her to insulin pump way of delivery ing insulin. She seems interested.   She states her current diabetes meds are: 40 lantus at night 4-5 to 16 Humalog before she eats at least ~30 units  units total daily in about 4 injections Ozempic 1 mg, Fridays has not missed Did not tolerate , has never been on farxiga jardiance   Activity- has dropped off significantly since the summer. 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 no snacks during day or night. She feels her weight is about the same and deferred a weight today. Plan to encourage more plant based meal pattern  Self monitoring- appears her blood sugar  Per Lib[employer] data her glucose has been improving gradully over 2 week intervals: 8/28-9/190 358,9/11-9/24 not enough data, 9/25-10/8 358; 10/9-10/22 (57% datta integrity) 319; 10/23-11/5 301.  Profile shows lowering overnight and rising with both meals despite her stating she is taking Humalog with meals as directed. A1c due at next visit.  CGM Results from download:   % Time CGM active:   97 %   (Goal >70%)  Average glucose:   301 mg/dL for 14 days  Glucose management indicator:   10.5 %  Time in range (70-180 mg/dL):   3 %   (Goal >40%)  Time High (181-250 mg/dL):   16 %   (Goal < 98%)  Time Very High (>250 mg/dL):    81 %   (Goal < 5%)  Time Low (54-69 mg/dL):   0 %   (Goal <1%)  Time Very Low (<54 mg/dL):   0 %   (Goal <1%)  Coefficient of variation:   23.7 %   (Goal <36%)   Plan: adjust inpen settings when able, follow up on Inpen order. Recommend increasing lantus dose to  44 units.  She would like a Referral to [facility] for her stress  When she asked about the free exercise calsses I noted that she is not eligible for  PREP so Referred to [facility]    Diabetes Self-Management Education - 07/28/23 0800       Visit Information   Visit Type Follow-up   5th after initial     Health Coping   How would you rate your overall health? Fair;Good      Complications   How often do you check your blood sugar? > 4 times/day    Fasting Blood glucose range (mg/dL) >308    Postprandial Blood glucose range (mg/dL) >657    Number of hypoglycemic episodes per month 0    Number of hyperglycemic episodes ( >200mg /dL): Daily    Can you tell when your blood sugar is high? --   plan to ask at follow up     Dietary Intake   Breakfast she states 2-3 times a week she  eats breakfast around 9-10 am 1 fried egg 2 strips bacon and 1 waffle    Dinner dinner is her largest meal around 6 PM- last night she had a 6" homemade sub made with cheese and [place] ham and a handful of fries    Beverage(s) water and Drinks only sugar free drinks      Patient Education   Previous Diabetes Education Yes (please comment)   here   Medications Reviewed medication adjustment guidelines for hyperglycemia and sick days.;Reviewed patients medication for diabetes, action, purpose, timing of dose and side effects.    Diabetes Stress and Support Role of stress on diabetes      Individualized Goals (developed by patient)   Nutrition Carb counting    Physical Activity Exercise 1-2 times per week    Medications --      Outcomes   Expected Outcomes Demonstrated interest in learning. Expect positive outcomes    Future DMSE 2 wks    Program Status Re-entered      Subsequent Visit   Since your last visit have you continued or begun to take your medications as prescribed? Yes    Since your last visit have you had your blood pressure checked? Yes    Is your most recent blood pressure lower, unchanged, or higher since your last visit? Higher    Since your last visit have you experienced any weight changes? No change    Since your last visit, are you checking your blood glucose at least once a day? Yes             Individualized Plan for Diabetes Self-Management Training:   Learning Objective:  Patient will have a greater understanding of diabetes self-management. Patient education plan is to attend individual and/or group sessions per assessed needs and concerns.   Plan:   Patient Instructions  Thank you for your visit today!  We talked about:   I have emailed the rep for [employer] Omnipod insulin pump  Raven Webb to call [employer] and [employer] repo to find out what happened to your inpen order.  Goals to work on:   Diabetes eye exam Dr. Laruth Bouchard office 07-31-23 Get in touch  with [employer] about your INpen [phone]  Look up Omnipod insulin pump and rad up on it. I will try to find someone for you to talk to about it.   Your goal is to lower blood sugars:  1-  Increase activity to help lower blood sugar 2-  Begin carb counting as we discussed with new settings in your INpen.  Please feel free to call me anytime.  Raven Webb [phone]   Expected Outcomes:  Demonstrated interest in learning. Expect positive outcomes  Education material provided: Diabetes Resources  If problems or questions, patient to contact team via:  Phone  Future DSME appointment: 2 wks /sign

## 2023-07-31 ENCOUNTER — Ambulatory Visit (HOSPITAL_COMMUNITY): Admission: RE | Admit: 2023-07-31 | Payer: Medicaid Other | Source: Ambulatory Visit

## 2023-07-31 ENCOUNTER — Other Ambulatory Visit (HOSPITAL_COMMUNITY): Payer: Self-pay

## 2023-07-31 LAB — HM DIABETES EYE EXAM

## 2023-08-03 ENCOUNTER — Institutional Professional Consult (permissible substitution): Payer: Self-pay | Admitting: Neurology

## 2023-08-03 ENCOUNTER — Encounter: Payer: Self-pay | Admitting: Neurology

## 2023-08-06 ENCOUNTER — Ambulatory Visit (HOSPITAL_COMMUNITY)
Admission: RE | Admit: 2023-08-06 | Discharge: 2023-08-06 | Disposition: A | Discharge status: Home / Self Care | Payer: Medicaid Other | Source: Ambulatory Visit | Attending: Internal Medicine | Admitting: Internal Medicine

## 2023-08-06 DIAGNOSIS — I1A0 Resistant hypertension: Secondary | ICD-10-CM

## 2023-08-11 ENCOUNTER — Encounter: Payer: Self-pay | Admitting: Dietician

## 2023-08-11 ENCOUNTER — Ambulatory Visit: Payer: Medicaid Other | Admitting: Dietician

## 2023-08-11 NOTE — Progress Notes (Signed)
I tried calling  Raven Webb on 08/11/23 for her appointment. Left voicemail for return call Norm Parcel, RD 08/11/2023 3:23 PM.

## 2023-08-27 ENCOUNTER — Other Ambulatory Visit (HOSPITAL_COMMUNITY): Payer: Self-pay

## 2023-08-31 ENCOUNTER — Ambulatory Visit: Payer: Medicaid Other

## 2023-08-31 ENCOUNTER — Other Ambulatory Visit: Payer: Self-pay

## 2023-08-31 ENCOUNTER — Ambulatory Visit: Payer: Medicaid Other | Admitting: Obstetrics & Gynecology

## 2023-08-31 VITALS — BP 122/81 | HR 84 | Wt 212.1 lb

## 2023-08-31 DIAGNOSIS — Z01419 Encounter for gynecological examination (general) (routine) without abnormal findings: Secondary | ICD-10-CM

## 2023-08-31 DIAGNOSIS — Z3042 Encounter for surveillance of injectable contraceptive: Secondary | ICD-10-CM

## 2023-08-31 MED ORDER — MEDROXYPROGESTERONE ACETATE 150 MG/ML IM SUSP
150.0000 mg | Freq: Once | INTRAMUSCULAR | Status: AC
  Administered 2023-08-31: 150 mg via INTRAMUSCULAR

## 2023-08-31 NOTE — Progress Notes (Signed)
GYNECOLOGY CLINIC ANNUAL PREVENTATIVE CARE ENCOUNTER NOTE  Subjective:   Raven Webb is a 37 y.o. [phone] female here for a routine annual gynecologic exam.  Current complaints: none.   Denies abnormal vaginal bleeding, discharge, pelvic pain, problems with intercourse or other gynecologic concerns.    Gynecologic History No LMP recorded. Patient has had an injection. Contraception: Depo-Provera injections Last Pap: 03/13/2021. Results were: normal Last mammogram: N/A.   Obstetric History OB History  Gravida Para Term Preterm AB Living  4 3 1 2 1 3   SAB IAB Ectopic Multiple Live Births  1            # Outcome Date GA Lbr Len/2nd Weight Sex Type Anes PTL Lv  4 SAB           3 Term           2 Preterm           1 Preterm             Past Medical History:  Diagnosis Date   GERD (gastroesophageal reflux disease)    Hypertension    IBS (irritable bowel syndrome)    UTI (lower urinary tract infection)     Past Surgical History:  Procedure Laterality Date   WISDOM TOOTH EXTRACTION      Current Outpatient Medications on File Prior to Visit  Medication Sig Dispense Refill   Accu-Chek Softclix Lancets lancets USE TO CHECK BLOOD SUGAR 3 TIMES DAILY 100 each 12   albuterol (PROVENTIL HFA) 108 (90 Base) MCG/ACT inhaler Inhale 1-2 puffs into the lungs every 6 (six) hours as needed for wheezing or shortness of breath. 18 g 0   amLODipine (NORVASC) 10 MG tablet Take 1 tablet (10 mg total) by mouth daily. 90 tablet 3   Blood Glucose Monitoring Suppl (ACCU-CHEK GUIDE) w/Device KIT 1 each by Does not apply route daily at 6 (six) AM. Use to check blood sugar 3 times daily 1 kit 2   Continuous Glucose Sensor (FREESTYLE LIBRE 3 PLUS SENSOR) MISC Change sensor every 15 days. Use to monitor your blood sugar continuously. 6 each 3   Continuous Glucose Sensor (FREESTYLE LIBRE 3 SENSOR) MISC Place 1 sensor on the skin, changing every 14 days, to check glucose continuously 2 each 11   glucose  blood (ACCU-CHEK GUIDE) test strip Use to check blood sugar 3 times daily when you do not have a Continuous glucose monitoring and when CGM tells you to use your meter 100 each 11   hydrochlorothiazide (HYDRODIURIL) 25 MG tablet TAKE 1 TABLET (25 MG TOTAL) BY MOUTH DAILY. 90 tablet 1   injection device for insulin (INPEN 100-PINK-LILLY-HUMALOG) DEVI Use to inject Humalog insulin as instructed. 1 each 0   insulin glargine (LANTUS SOLOSTAR) 100 UNIT/ML Solostar Pen Inject 36 Units into the skin at bedtime. 15 mL 3   insulin lispro (HUMALOG) 100 UNIT/ML cartridge Inject 0.1 mLs (10 Units total) into the skin 3 (three) times daily with meals. May also inject 0.02 mLs (2 Units total) as needed (For snacks). 15 mL 11   insulin lispro (HUMALOG) 100 UNIT/ML cartridge Inject 10 Units total into the skin 3 (three) times daily with meals. To be used with Inpen. 9 mL 11   Insulin Pen Needle 32G X 4 MM MISC Use to inject insulin at the same time each day 100 each 3   Insulin Pen Needle 32G X 4 MM MISC Inject 1 Needle into the skin in the morning, at  noon, and at bedtime. 100 each 11   lisinopril (ZESTRIL) 40 MG tablet TAKE 1 TABLET (40 MG TOTAL) BY MOUTH DAILY. 90 tablet 1   medroxyPROGESTERone (DEPO-PROVERA) 150 MG/ML injection Inject 150 mg into the muscle every 3 (three) months.     metoCLOPramide (REGLAN) 10 MG tablet TAKE 1 TABLET (10 MG TOTAL) BY MOUTH EVERY 6 (SIX) HOURS. 60 tablet 0   Semaglutide, 1 MG/DOSE, 4 MG/3ML SOPN Inject 1 mg into the skin once a week. 3 mL 3   spironolactone (ALDACTONE) 25 MG tablet Take 0.5 tablets (12.5 mg total) by mouth daily. 30 tablet 3   No current facility-administered medications on file prior to visit.    No Known Allergies  Social History   Socioeconomic History   Marital status: Single    Spouse name: Not on file   Number of children: Not on file   Years of education: Not on file   Highest education level: Not on file  Occupational History   Not on file   Tobacco Use   Smoking status: Never   Smokeless tobacco: Never  Vaping Use   Vaping status: Never Used  Substance and Sexual Activity   Alcohol use: No    Alcohol/week: 0.0 standard drinks of alcohol   Drug use: No   Sexual activity: Not Currently    Birth control/protection: Injection  Other Topics Concern   Not on file  Social History Narrative   Not on file   Social Determinants of Health   Financial Resource Strain: Not on file  Food Insecurity: No Food Insecurity (10/15/2022)   Hunger Vital Sign    Worried About Running Out of Food in the Last Year: Never true    Ran Out of Food in the Last Year: Never true  Transportation Needs: Unknown (10/15/2022)   PRAPARE - Administrator, Civil Service (Medical): No    Lack of Transportation (Non-Medical): Not on file  Physical Activity: Not on file  Stress: Not on file  Social Connections: Moderately Integrated (10/15/2022)   Social Connection and Isolation Panel [NHANES]    Frequency of Communication with Friends and Family: More than three times a week    Frequency of Social Gatherings with Friends and Family: More than three times a week    Attends Religious Services: More than 4 times per year    Active Member of [employer] or Organizations: Yes    Attends Engineer, structural: More than 4 times per year    Marital Status: Never married  Intimate Partner Violence: Not At Risk (10/15/2022)   Humiliation, Afraid, Rape, and Kick questionnaire    Fear of Current or Ex-Partner: No    Emotionally Abused: No    Physically Abused: No    Sexually Abused: No    Family History  Problem Relation Age of Onset   Hypertension Mother    Diabetes Mother    Hypertension Father    Hypertension Sister    Diabetes Sister    Hypertension Maternal Uncle    Kidney disease Maternal Uncle     The following portions of the patient's history were reviewed and updated as appropriate: allergies, current medications, past family  history, past medical history, past social history, past surgical history and problem list.  Review of Systems Pertinent items are noted in HPI.   Objective:  BP 122/81   Pulse 84   Wt 212 lb 1.6 oz (96.2 kg)   BMI 33.22 kg/m  CONSTITUTIONAL: Well-developed, well-nourished  female in no acute distress.  HENT:  Normocephalic, atraumatic, External right and left ear normal.  EYES: Conjunctivae and EOM are normal.  NECK: Normal range of motion. SKIN: Skin is warm and dry. No rash noted. Not diaphoretic. No erythema. No pallor. NEUROLGIC: Alert and oriented to person, place, and time.  PSYCHIATRIC: Normal mood and affect. Normal behavior. Normal judgment and thought content. CARDIOVASCULAR: Normal heart rate noted, regular rhythm RESPIRATORY: Clear to auscultation bilaterally. Effort and breath sounds normal, no problems with respiration noted. BREASTS: Symmetric in size. No masses, skin changes, nipple drainage, or lymphadenopathy. ABDOMEN: Soft, normal bowel sounds, no distention noted.  No tenderness, rebound or guarding.  PELVIC: Deferred. MUSCULOSKELETAL: Normal range of motion.   Assessment:  Annual gynecologic examination with breast examination. Not due for pap.    Plan:  Follow up in 1 year for pap.  Routine preventative health maintenance measures emphasized. Please refer to After Visit Summary for other counseling recommendations.   Dyke Maes, [place] Student  Attestation of Attending Supervision of PA Student: Evaluation and management procedures were performed by the PA student under my supervision and collaboration.  I have reviewed the student's note and chart, and I agree with the management and plan.  Scheryl Darter, MD, FACOG Attending Obstetrician & Gynecologist Faculty Practice, [facility]

## 2023-09-01 ENCOUNTER — Telehealth: Payer: Self-pay | Admitting: Dietician

## 2023-09-01 NOTE — Telephone Encounter (Signed)
Patient refused to speak with them. Order refaxed. They will call patient again.

## 2023-09-01 NOTE — Telephone Encounter (Signed)
Order has not been processed; [phone] option 3, sales team.

## 2023-09-02 NOTE — Telephone Encounter (Signed)
I called pt to schedule an appt - no answer; left message to call the office.

## 2023-09-02 NOTE — Telephone Encounter (Signed)
Can you contact [facility] about scheduling an appointment with a doctor? Thank you!

## 2023-09-03 ENCOUNTER — Other Ambulatory Visit: Payer: Self-pay | Admitting: Student

## 2023-09-03 DIAGNOSIS — E119 Type 2 diabetes mellitus without complications: Secondary | ICD-10-CM

## 2023-09-09 ENCOUNTER — Encounter: Payer: Medicaid Other | Admitting: Student

## 2023-09-10 ENCOUNTER — Encounter: Payer: Medicaid Other | Admitting: Student

## 2023-09-10 ENCOUNTER — Ambulatory Visit: Payer: Medicaid Other | Admitting: Student

## 2023-09-10 ENCOUNTER — Other Ambulatory Visit (HOSPITAL_COMMUNITY): Payer: Self-pay

## 2023-09-10 VITALS — BP 126/102 | HR 73 | Temp 98.1°F | Ht 67.0 in | Wt 214.8 lb

## 2023-09-10 DIAGNOSIS — Z7985 Long-term (current) use of injectable non-insulin antidiabetic drugs: Secondary | ICD-10-CM

## 2023-09-10 DIAGNOSIS — E118 Type 2 diabetes mellitus with unspecified complications: Secondary | ICD-10-CM | POA: Diagnosis not present

## 2023-09-10 DIAGNOSIS — Z794 Long term (current) use of insulin: Secondary | ICD-10-CM | POA: Diagnosis not present

## 2023-09-10 DIAGNOSIS — I10 Essential (primary) hypertension: Secondary | ICD-10-CM | POA: Diagnosis not present

## 2023-09-10 DIAGNOSIS — Z3042 Encounter for surveillance of injectable contraceptive: Secondary | ICD-10-CM

## 2023-09-10 DIAGNOSIS — E119 Type 2 diabetes mellitus without complications: Secondary | ICD-10-CM

## 2023-09-10 DIAGNOSIS — Z3202 Encounter for pregnancy test, result negative: Secondary | ICD-10-CM | POA: Diagnosis not present

## 2023-09-10 LAB — POCT GLYCOSYLATED HEMOGLOBIN (HGB A1C): Hemoglobin A1C: 12.3 % — AB (ref 4.0–5.6)

## 2023-09-10 LAB — POCT URINE PREGNANCY: Preg Test, Ur: NEGATIVE

## 2023-09-10 LAB — GLUCOSE, CAPILLARY: Glucose-Capillary: 129 mg/dL — ABNORMAL HIGH (ref 70–99)

## 2023-09-10 MED ORDER — LISINOPRIL 40 MG PO TABS
40.0000 mg | ORAL_TABLET | Freq: Every day | ORAL | 1 refills | Status: DC
  Filled 2023-09-10 (×2): qty 90, 90d supply, fill #0

## 2023-09-10 MED ORDER — LANTUS SOLOSTAR 100 UNIT/ML ~~LOC~~ SOPN
50.0000 [IU] | PEN_INJECTOR | Freq: Every day | SUBCUTANEOUS | 3 refills | Status: DC
  Filled 2023-09-10 – 2023-09-24 (×4): qty 15, 30d supply, fill #0

## 2023-09-10 MED ORDER — SPIRONOLACTONE 25 MG PO TABS
12.5000 mg | ORAL_TABLET | Freq: Every day | ORAL | 3 refills | Status: DC
  Filled 2023-09-10: qty 30, 60d supply, fill #0

## 2023-09-10 NOTE — Progress Notes (Signed)
CC: Routine Follow Up   HPI:  Raven Webb is a 37 y.o. female with pertinent PMH of T2DM, class II obesity, hypertension, migraines, and GERD presenting for a follow-up after a telehealth visit on 07/09/2023. Please see assessment and plan below for further details.  Past Medical History:  Diagnosis Date   GERD (gastroesophageal reflux disease)    Hypertension    IBS (irritable bowel syndrome)    UTI (lower urinary tract infection)     Current Outpatient Medications  Medication Instructions   Accu-Chek Softclix Lancets lancets USE TO CHECK BLOOD SUGAR 3 TIMES DAILY   albuterol (PROVENTIL HFA) 108 (90 Base) MCG/ACT inhaler 1-2 puffs, Inhalation, Every 6 hours PRN   amLODipine (NORVASC) 10 mg, Oral, Daily   Blood Glucose Monitoring Suppl (ACCU-CHEK GUIDE) w/Device KIT 1 each, Does not apply, Daily, Use to check blood sugar 3 times daily   Continuous Glucose Sensor (FREESTYLE LIBRE 3 PLUS SENSOR) MISC Change sensor every 15 days. Use to monitor your blood sugar continuously.   Continuous Glucose Sensor (FREESTYLE LIBRE 3 SENSOR) MISC Place 1 sensor on the skin, changing every 14 days, to check glucose continuously   glucose blood (ACCU-CHEK GUIDE) test strip Use to check blood sugar 3 times daily when you do not have a Continuous glucose monitoring and when CGM tells you to use your meter   hydrochlorothiazide (HYDRODIURIL) 25 MG tablet TAKE 1 TABLET (25 MG TOTAL) BY MOUTH DAILY.   injection device for insulin (INPEN 100-PINK-LILLY-HUMALOG) DEVI Use to inject Humalog insulin as instructed.   insulin lispro (HUMALOG) 100 UNIT/ML cartridge Inject 0.1 mLs (10 Units total) into the skin 3 (three) times daily with meals. May also inject 0.02 mLs (2 Units total) as needed (For snacks).   insulin lispro (HUMALOG) 100 UNIT/ML cartridge Inject 10 Units total into the skin 3 (three) times daily with meals. To be used with Inpen.   Insulin Pen Needle 32G X 4 MM MISC Use to inject insulin at the  same time each day   Insulin Pen Needle 32G X 4 MM MISC 1 Needle, Subcutaneous, 3 times daily   Lantus SoloStar 50 Units, Subcutaneous, Daily at bedtime   lisinopril (ZESTRIL) 40 mg, Oral, Daily   medroxyPROGESTERone (DEPO-PROVERA) 150 mg, Intramuscular, Every 3 months   Ozempic (1 MG/DOSE) 1 mg, Subcutaneous, Weekly   spironolactone (ALDACTONE) 12.5 mg, Oral, Daily     Review of Systems:   Pertinent items noted in HPI and/or A&P.  Physical Exam:  Vitals:   09/10/23 0959  BP: (!) 126/102  Pulse: 73  Temp: 98.1 F (36.7 C)  TempSrc: Oral  SpO2: 100%  Weight: 214 lb 12.8 oz (97.4 kg)  Height: 5\' 7"  (1.702 m)    Constitutional: Well-appearing adult female. In no acute distress. HEENT: Normocephalic, atraumatic, Sclera non-icteric, PERRL, EOM intact Cardio:Regular rate and rhythm. 2+ bilateral radial pulses. Pulm:Clear to auscultation bilaterally. Normal work of breathing on room air. Abdomen: Soft, non-tender, non-distended, positive bowel sounds. HYQ:MVHQIONG for extremity edema. Skin:Warm and dry. Neuro:Alert and oriented x3. No focal deficit noted. Psych:Pleasant mood and affect.   Assessment & Plan:   DM (diabetes mellitus), type 2 with complications [facility]) Patient comes in for follow-up after last being seen for a telehealth on 07/09/2023.  At that time she was having very high blood glucose and her long-acting insulin was increased to 40 units daily.  A1c on 05/06/2023 was 7.0 and now her A1c here is 12.3.  Her CGM data shows very  high average glucose at 343 with 92% of the time spent above 250 over the last 2 weeks.  She has had polyuria and 10 pound weight loss over the past 3 months.  Discussed with the diabetes coordinator. - Increase Lantus to 50 units daily and continue with InPen (waiting to get her own InPen) - Continue Ozempic 1 mg weekly - Follow-up with diabetes coordinator in the next 1-2 weeks - Follow-up in our clinic in 1 month  Hypertension Blood  pressure elevated today at 126/102.  She states that she does take her spironolactone 25 mg daily, lisinopril 40 mg daily, and amlodipine 10 mg daily.  Pharmacy fill history for the spironolactone and lisinopril are very poor but amlodipine does appear to be picked up recently.  Discussed restarting/continuing these medications and counseled extensively on making sure that these medications match with once she has been taking already.  If they do not then she should call our clinic and we will talk about restarting at a lower dose. - Continue amlodipine 10 mg daily, lisinopril 40 mg daily, and spironolactone 25 mg daily    Patient discussed with Dr. Kris Mouton, DO [facility] Internal Medicine Resident PGY-2 Clinic Phone: [phone] Pager: [phone]

## 2023-09-10 NOTE — Patient Instructions (Addendum)
  Thank you, [name], for allowing [place] to provide your care today. Today we discussed . . .  > Diabetes       -Your blood sugars have not been under good control recently but at the moment we are unsure why this is changed from the summer.  Today we are going to check a lab to make sure that you are not having any electrolyte or kidney abnormalities due to your high blood sugars.  We are also going to increase your Lantus to 50 units nightly.  Please continue with your InPen for mealtime insulin and with your Ozempic.  We will have you follow-up with Raven Webb our diabetes coordinator in 2 weeks and then follow-up with one of the doctors in 4 weeks. > High blood pressure       -Your blood pressure is just a little bit elevated today.  I have sent in refills for your spironolactone and lisinopril.  Before taking these medications make sure that they match up with medications that you have been taking every day both in the name of the medication and the dose.  If you have not been taking the spironolactone and lisinopril do not resume these because they may be too strong at the current dose.  Please call us if you do not have lisinopril and spironolactone at home and we will talk about starting these medications at a lower dose.  Please continue to take your amlodipine as you have been.  I have ordered the following labs for you:  Lab Orders         Glucose, capillary         BMP8+Anion Gap         POC Hbg A1C         POCT Urine Pregnancy       Referrals ordered today:   Referral Orders  No referral(s) requested today      I have ordered the following medication/changed the following medications:   Stop the following medications: Medications Discontinued During This Encounter  Medication Reason   metoCLOPramide (REGLAN) 10 MG tablet    lisinopril (ZESTRIL) 40 MG tablet Reorder   insulin glargine (LANTUS SOLOSTAR) 100 UNIT/ML Solostar Pen Reorder   spironolactone (ALDACTONE) 25 MG  tablet Reorder     Start the following medications: Meds ordered this encounter  Medications   insulin glargine (LANTUS SOLOSTAR) 100 UNIT/ML Solostar Pen    Sig: Inject 50 Units into the skin at bedtime.    Dispense:  15 mL    Refill:  3   lisinopril (ZESTRIL) 40 MG tablet    Sig: Take 1 tablet (40 mg total) by mouth daily.    Dispense:  90 tablet    Refill:  1   spironolactone (ALDACTONE) 25 MG tablet    Sig: Take 0.5 tablets (12.5 mg total) by mouth daily.    Dispense:  30 tablet    Refill:  3      Follow up: 2 weeks with diabetic coordinator in 4 weeks with MD/DO   Remember:     Should you have any questions or concerns please call the internal medicine clinic at [phone].     Rocky Morel, DO [facility]

## 2023-09-11 LAB — BMP8+ANION GAP
Anion Gap: 16 mmol/L (ref 10.0–18.0)
BUN/Creatinine Ratio: 11 (ref 9–23)
BUN: 11 mg/dL (ref 6–20)
CO2: 21 mmol/L (ref 20–29)
Calcium: 10 mg/dL (ref 8.7–10.2)
Chloride: 101 mmol/L (ref 96–106)
Creatinine, Ser: 0.99 mg/dL (ref 0.57–1.00)
Glucose: 131 mg/dL — ABNORMAL HIGH (ref 70–99)
Potassium: 3.5 mmol/L (ref 3.5–5.2)
Sodium: 138 mmol/L (ref 134–144)
eGFR: 76 mL/min/{1.73_m2} (ref >59)

## 2023-09-14 NOTE — Assessment & Plan Note (Signed)
Blood pressure elevated today at 126/102.  She states that she does take her spironolactone 25 mg daily, lisinopril 40 mg daily, and amlodipine 10 mg daily.  Pharmacy fill history for the spironolactone and lisinopril are very poor but amlodipine does appear to be picked up recently.  Discussed restarting/continuing these medications and counseled extensively on making sure that these medications match with once she has been taking already.  If they do not then she should call our clinic and we will talk about restarting at a lower dose. - Continue amlodipine 10 mg daily, lisinopril 40 mg daily, and spironolactone 25 mg daily

## 2023-09-14 NOTE — Assessment & Plan Note (Addendum)
Patient comes in for follow-up after last being seen for a telehealth on 07/09/2023.  At that time she was having very high blood glucose and her long-acting insulin was increased to 40 units daily.  A1c on 05/06/2023 was 7.0 and now her A1c here is 12.3.  Her CGM data shows very high average glucose at 343 with 92% of the time spent above 250 over the last 2 weeks.  She has had polyuria and 10 pound weight loss over the past 3 months.  Discussed with the diabetes coordinator. - Increase Lantus to 50 units daily and continue with InPen (waiting to get her own InPen) - Continue Ozempic 1 mg weekly - Follow-up with diabetes coordinator in the next 1-2 weeks - Follow-up in our clinic in 1 month

## 2023-09-21 NOTE — Progress Notes (Signed)
Internal Medicine Clinic Attending  Case discussed with the resident at the time of the visit.  We reviewed the resident's history and exam and pertinent patient test results.  I agree with the assessment, diagnosis, and plan of care documented in the resident's note.  

## 2023-09-22 ENCOUNTER — Other Ambulatory Visit (HOSPITAL_COMMUNITY): Payer: Self-pay

## 2023-09-24 ENCOUNTER — Other Ambulatory Visit (HOSPITAL_COMMUNITY): Payer: Self-pay

## 2023-09-25 ENCOUNTER — Other Ambulatory Visit (HOSPITAL_COMMUNITY): Payer: Self-pay

## 2023-10-05 ENCOUNTER — Telehealth: Payer: Self-pay | Admitting: Dietician

## 2023-10-05 NOTE — Telephone Encounter (Signed)
 Tried calling this patient about her expectations and understanding of an insulin pump.  Their voicemail box is not set up. I was unable to leave a message

## 2023-10-05 NOTE — Telephone Encounter (Signed)
 Called to find out if she got her Inpen and to discuss Omnipod insulin pump a bit more

## 2023-10-06 ENCOUNTER — Ambulatory Visit: Payer: Medicaid Other | Admitting: Obstetrics and Gynecology

## 2023-10-08 ENCOUNTER — Other Ambulatory Visit: Payer: Self-pay | Admitting: Internal Medicine

## 2023-10-08 ENCOUNTER — Other Ambulatory Visit (HOSPITAL_COMMUNITY): Payer: Self-pay

## 2023-10-08 ENCOUNTER — Ambulatory Visit (INDEPENDENT_AMBULATORY_CARE_PROVIDER_SITE_OTHER): Payer: Medicaid Other | Admitting: Dietician

## 2023-10-08 DIAGNOSIS — E118 Type 2 diabetes mellitus with unspecified complications: Secondary | ICD-10-CM | POA: Diagnosis not present

## 2023-10-08 MED ORDER — INSULIN ASPART 100 UNIT/ML IJ SOLN
76.0000 [IU] | Freq: Every day | INTRAMUSCULAR | 2 refills | Status: DC
  Filled 2023-10-08: qty 20, 26d supply, fill #0

## 2023-10-08 NOTE — Progress Notes (Addendum)
[name] is a 38 year old with uncontrolled insulin-dependent diabetes.  Her last A1c was 12.3 in December.  She is interested in trying V-Go insulin delivery patch.  I talked with [name] about this and she provided education to patient today.  P: NovoLog 76 units/day prescription sent to [facility] outpatient pharmacy. She was given sample of V-Go system

## 2023-10-08 NOTE — Progress Notes (Addendum)
Patient care was discussed with DR. Masters today. She approved [place] giving her VGo40 samples x 12 days and to teach her how to use them.  I reviewed and approve this note and the plan. Norm Parcel, RD 10/08/2023 4:23 PM.

## 2023-10-08 NOTE — Progress Notes (Signed)
Diabetes Self-Management Education  Visit Type: Follow-up  Appt. Start Time: 11:30am Appt. End Time: 12:15pm  10/08/2023  Raven Webb, identified by name and date of birth, is a 38 y.o. female with a diagnosis of Diabetes:  .   ASSESSMENT  There were no vitals taken for this visit. There is no height or weight on file to calculate BMI.   Diabetes Self-Management Education - 10/08/23 1500       Visit Information   Visit Type Follow-up      Health Coping   How would you rate your overall health? Fair      Psychosocial Assessment   What is the hardest part about your diabetes right now, causing you the most concern, or is the most worrisome to you about your diabetes?   --   Forgetting to take insulin     Pre-Education Assessment   Patient understands using medications safely. Comprehends key points    Patient understands monitoring blood glucose, interpreting and using results --      Complications   Last HgB A1C per patient/outside source 12.3 %   December 2024   How often do you check your blood sugar? --   On CGM   Fasting Blood glucose range (mg/dL) <72    Postprandial Blood glucose range (mg/dL) --   Unknown   Number of hypoglycemic episodes per month --   Unknown   Number of hyperglycemic episodes ( >200mg /dL): Daily      Patient Education   Medications Reviewed patients medication for diabetes, action, purpose, timing of dose and side effects.;Other (comment)   Instructed how to place and administer insulin with the V-Go insulin delivery device     Individualized Goals (developed by patient)   Medications take my medication as prescribed;Other (comment)   Utilize the V-Go insulin delivery device     Patient Self-Evaluation of Goals - Patient rates self as meeting previously set goals (% of time)   Medications >75% (most of the time)      Post-Education Assessment   Patient understands using medications safely. Needs Review   Review to see if pt should stay on  V-Go or switch to Omnipod if necessary     Outcomes   Expected Outcomes Demonstrated interest in learning. Expect positive outcomes    Future DMSE Other (comment)   To be determined   Program Status Not Completed      Subsequent Visit   Since your last visit have you continued or begun to take your medications as prescribed? Yes    Since your last visit have you had your blood pressure checked? No    Since your last visit have you experienced any weight changes? --   Weight not taken today   Since your last visit, are you checking your blood glucose at least once a day? Yes             Individualized Plan for Diabetes Self-Management Training:   Learning Objective:  Patient will have a greater understanding of diabetes self-management. Patient education plan is to attend individual and/or group sessions per assessed needs and concerns.   Plan:   There are no Patient Instructions on file for this visit.  Expected Outcomes:  Demonstrated interest in learning. Expect positive outcomes  Education material provided:  Per approval of Dr. Sloan Leiter provided patient with 12-day samples of V-go 40  If problems or questions, patient to contact team via:  Phone  Future DSME appointment: Other (comment) (To be  determined)  Laverta Baltimore, Student-Dietician 10/08/2023 4:28 PM.

## 2023-10-14 ENCOUNTER — Telehealth: Payer: Self-pay

## 2023-10-14 ENCOUNTER — Ambulatory Visit: Payer: Medicaid Other | Admitting: Student

## 2023-10-14 ENCOUNTER — Telehealth: Payer: Self-pay | Admitting: Dietician

## 2023-10-14 ENCOUNTER — Other Ambulatory Visit (HOSPITAL_COMMUNITY): Payer: Self-pay

## 2023-10-14 VITALS — BP 134/90 | HR 79 | Temp 98.6°F | Ht 67.0 in | Wt 219.8 lb

## 2023-10-14 DIAGNOSIS — Z794 Long term (current) use of insulin: Secondary | ICD-10-CM | POA: Diagnosis not present

## 2023-10-14 DIAGNOSIS — I1A0 Resistant hypertension: Secondary | ICD-10-CM

## 2023-10-14 DIAGNOSIS — I10 Essential (primary) hypertension: Secondary | ICD-10-CM

## 2023-10-14 DIAGNOSIS — E119 Type 2 diabetes mellitus without complications: Secondary | ICD-10-CM

## 2023-10-14 DIAGNOSIS — E118 Type 2 diabetes mellitus with unspecified complications: Secondary | ICD-10-CM

## 2023-10-14 DIAGNOSIS — Z7985 Long-term (current) use of injectable non-insulin antidiabetic drugs: Secondary | ICD-10-CM

## 2023-10-14 LAB — POCT GLYCOSYLATED HEMOGLOBIN (HGB A1C): Hemoglobin A1C: 11.6 % — AB (ref 4.0–5.6)

## 2023-10-14 LAB — GLUCOSE, CAPILLARY: Glucose-Capillary: 242 mg/dL — ABNORMAL HIGH (ref 70–99)

## 2023-10-14 MED ORDER — OMNIPOD 5 DEXG7G6 PODS GEN 5 MISC: 11 refills | Status: DC

## 2023-10-14 MED ORDER — OMNIPOD 5 DEXG7G6 INTRO GEN 5 KIT: PACK | 0 refills | Status: DC

## 2023-10-14 MED ORDER — INSULIN ASPART 100 UNIT/ML IJ SOLN: INTRAMUSCULAR | 11 refills | Status: AC

## 2023-10-14 MED ORDER — OMNIPOD 5 DEXG7G6 INTRO GEN 5 KIT
PACK | 0 refills | Status: DC
  Filled 2023-10-14 – 2023-10-15 (×3): qty 1, 30d supply, fill #0
  Filled 2023-10-15: qty 1, 90d supply, fill #0
  Filled 2023-10-15: qty 1, 30d supply, fill #0
  Filled 2023-10-16: qty 1, 365d supply, fill #0

## 2023-10-14 MED ORDER — SPIRONOLACTONE 25 MG PO TABS: 12.5000 mg | ORAL_TABLET | Freq: Every day | ORAL | 3 refills | Status: DC

## 2023-10-14 MED ORDER — OMNIPOD 5 DEXG7G6 PODS GEN 5 MISC
11 refills | Status: DC
  Filled 2023-10-14: qty 15, 30d supply, fill #0
  Filled 2023-11-03: qty 15, 30d supply, fill #1
  Filled 2023-11-23 – 2023-11-26 (×2): qty 15, 30d supply, fill #2
  Filled 2023-12-18 – 2023-12-21 (×2): qty 15, 30d supply, fill #3
  Filled 2024-01-11 – 2024-01-13 (×2): qty 15, 30d supply, fill #4
  Filled 2024-02-01 – 2024-02-05 (×5): qty 15, 30d supply, fill #5
  Filled 2024-02-24 (×2): qty 15, 30d supply, fill #6

## 2023-10-14 MED ORDER — DEXCOM G7 SENSOR MISC: 3 refills | Status: DC

## 2023-10-14 MED ORDER — AMLODIPINE BESYLATE 10 MG PO TABS: 10.0000 mg | ORAL_TABLET | Freq: Every day | ORAL | 3 refills | Status: DC

## 2023-10-14 MED ORDER — HYDROCHLOROTHIAZIDE 25 MG PO TABS: 25.0000 mg | ORAL_TABLET | Freq: Every day | ORAL | 1 refills | Status: DC

## 2023-10-14 NOTE — Telephone Encounter (Signed)
Decision:Approved  Naida Sleight (Key: [id number]) PA Case ID #: [id number] Rx #: [id number] Need Help? Call us at [phone] Outcome Approved today by [facility] PA Case: [id number], Status: Approved, Coverage Starts on: 10/14/2023 12:00:00 AM, Coverage Ends on: 04/11/2024 12:00:00 AM. Authorization Expiration Date: 04/10/2024 Drug Dexcom G7 Sensor ePA cloud logo Form [facility] [place] Electronic [place] Form [phone] NCPDP) Original Claim Info 75

## 2023-10-14 NOTE — Telephone Encounter (Signed)
Prior Authorization for patient (Omnipod 5 DexG7G6 Intro Gen 5 kit) came through on cover my meds was submitted.. Per cover my meds  [id number]  Madilynn Sereno (Key: [id number]) Need Help? Call us at [phone] Outcome Additional Information Required Available without authorization. Drug Omnipod 5 DexG7G6 Intro Gen 5 kit ePA cloud logo Form [facility] [place] Form [phone] NCPDP)

## 2023-10-14 NOTE — Telephone Encounter (Signed)
Prior Authorization for patient (Dexcom G7 Sensor) came through on cover my meds was submitted with last office notes and labs awaiting approval or denial.  [id number]

## 2023-10-14 NOTE — Progress Notes (Signed)
Established Patient Office Visit  Subjective   Patient ID: Raven Webb, female    DOB: 1986-08-13  Age: 38 y.o. MRN: [id number]  Chief Complaint  Patient presents with   Diabetes   Patient is a 38 y.o. with a past medical history stated below who presents today for follow-up for diabetes. She was last seen at [facility] 09/10/23. Please see problem based assessment and plan for additional details.     Past Medical History:  Diagnosis Date   GERD (gastroesophageal reflux disease)    Hypertension    IBS (irritable bowel syndrome)    UTI (lower urinary tract infection)     Review of Systems  Gastrointestinal:  Negative for abdominal pain, nausea and vomiting.  Genitourinary:  Negative for dysuria.      Objective:     BP (!) 134/90 (BP Location: Right Arm, Patient Position: Sitting, Cuff Size: Small)   Pulse 79   Temp 98.6 F (37 C) (Oral)   Ht 5\' 7"  (1.702 m)   Wt 219 lb 12.8 oz (99.7 kg)   BMI 34.43 kg/m  BP Readings from Last 3 Encounters:  10/14/23 (!) 134/90  09/10/23 (!) 126/102  08/31/23 122/81   Wt Readings from Last 3 Encounters:  10/14/23 219 lb 12.8 oz (99.7 kg)  09/10/23 214 lb 12.8 oz (97.4 kg)  08/31/23 212 lb 1.6 oz (96.2 kg)    Physical Exam Constitutional:      Appearance: Normal appearance.  HENT:     Head: Normocephalic and atraumatic.     Nose: Nose normal.     Mouth/Throat:     Mouth: Mucous membranes are moist.  Eyes:     Extraocular Movements: Extraocular movements intact.     Pupils: Pupils are equal, round, and reactive to light.  Pulmonary:     Effort: Pulmonary effort is normal.  Abdominal:     General: There is no distension.     Palpations: Abdomen is soft.     Tenderness: There is no abdominal tenderness.  Musculoskeletal:        General: Normal range of motion.  Skin:    General: Skin is warm and dry.  Neurological:     General: No focal deficit present.     Mental Status: She is alert.  Psychiatric:        Mood and  Affect: Mood normal.        Behavior: Behavior normal.    Results for orders placed or performed in visit on 10/14/23  Glucose, capillary  Result Value Ref Range   Glucose-Capillary 242 (H) 70 - 99 mg/dL  POC Hbg N8G  Result Value Ref Range   Hemoglobin A1C 11.6 (A) 4.0 - 5.6 %   HbA1c POC (<> result, manual entry)     HbA1c, POC (prediabetic range)     HbA1c, POC (controlled diabetic range)      Last hemoglobin A1c Lab Results  Component Value Date   HGBA1C 11.6 (A) 10/14/2023    The ASCVD Risk score (Arnett DK, et al., 2019) failed to calculate for the following reasons:   The 2019 ASCVD risk score is only valid for ages 9 to 36    Assessment & Plan:   Problem List Items Addressed This Visit     Hypertension   Patient taking amlodipine 10 mg daily, hydrochlorothiazide 25 mg daily, and spironolactone 25 mg daily. BP today 134/90. Denies any chest pain, shortness of breath, heart palpitations, headache, or swelling. Will continue current medication  regimen.  Plan - Continue amlodipine 10 mg daily, hydrochlorothiazide 25 mg daily, and spironolactone 25 mg daily      Relevant Medications   spironolactone (ALDACTONE) 25 MG tablet   hydrochlorothiazide (HYDRODIURIL) 25 MG tablet   amLODipine (NORVASC) 10 MG tablet   DM (diabetes mellitus), type 2 with complications (HCC)   Patient's Hgb A1c 11.6% today from 12.3% about a month ago. Patient taking Lantus 50 units daily, Ozempic 1 mg weekly injections, and short acting insulin 10 units TID with meals. Tolerating medications well. Does not want to increase Ozempic dose at thistime. Attempted using V-Go system but did not stay on. Prefers omnipod. Discussed instructions/use with diabetic coordinator during visit Raven Webb). Prescribed Omnipod with insulin aspart up to 90 units daily (50 units for basal, ~10 units TID with meals, ~10 units for flexible use/waste) as well as Dexcom CGM. Patient will receive specific instructions for  omnipod use, Raven Webb to continue following. Patient to continue current insulin regimen until she can get started with omnipod use. Return to clinic in 1 month for close follow up.       Relevant Medications   insulin aspart (NOVOLOG) 100 UNIT/ML injection   Other Visit Diagnoses       Type 2 diabetes mellitus without complication, without long-term current use of insulin (HCC)    -  Primary   Relevant Medications   insulin aspart (NOVOLOG) 100 UNIT/ML injection   Other Relevant Orders   POC Hbg A1C (Completed)      Patient seen with Dr. Sol Blazing.  Return in about 4 weeks (around 11/11/2023) for Diabetes medication management .   Raven Pytel Colbert Coyer, MD

## 2023-10-14 NOTE — Patient Instructions (Signed)
Thank you, [name] for allowing [place] to provide your care today. Today we discussed your diabetes medication management as well as your blood pressure medications and refills.   I have ordered the following labs for you:   Lab Orders         Glucose, capillary         POC Hbg A1C      Tests ordered today:  None  Referrals ordered today:   Referral Orders  No referral(s) requested today     I have ordered the following medication/changed the following medications:   Stop the following medications: Medications Discontinued During This Encounter  Medication Reason   injection device for insulin (INPEN 100-PINK-LILLY-HUMALOG) DEVI    insulin aspart (NOVOLOG) 100 UNIT/ML injection    insulin lispro (HUMALOG) 100 UNIT/ML cartridge    insulin lispro (HUMALOG) 100 UNIT/ML cartridge    lisinopril (ZESTRIL) 40 MG tablet    hydrochlorothiazide (HYDRODIURIL) 25 MG tablet Reorder   amLODipine (NORVASC) 10 MG tablet Reorder   spironolactone (ALDACTONE) 25 MG tablet Reorder     Start the following medications: Meds ordered this encounter  Medications   spironolactone (ALDACTONE) 25 MG tablet    Sig: Take 0.5 tablets (12.5 mg total) by mouth daily.    Dispense:  30 tablet    Refill:  3   hydrochlorothiazide (HYDRODIURIL) 25 MG tablet    Sig: Take 1 tablet (25 mg total) by mouth daily.    Dispense:  90 tablet    Refill:  1   Insulin Disposable Pump (OMNIPOD 5 DEXG7G6 INTRO GEN 5) KIT    Sig: Use to administer insulin as instructed.    Dispense:  1 kit    Refill:  0   insulin aspart (NOVOLOG) 100 UNIT/ML injection    Sig: Use with omnipod to administer up to 90 units daily.    Dispense:  10 mL    Refill:  11   Insulin Disposable Pump (OMNIPOD 5 DEXG7G6 PODS GEN 5) MISC    Sig: Use with omnipod to administer up to 90 units daily.    Dispense:  15 each    Refill:  11   Continuous Glucose Sensor (DEXCOM G7 SENSOR) MISC    Sig: Use to continuously monitor blood sugar     Dispense:  9 each    Refill:  3   amLODipine (NORVASC) 10 MG tablet    Sig: Take 1 tablet (10 mg total) by mouth daily.    Dispense:  90 tablet    Refill:  3     Follow up: 1 month   Remember:   - PLEASE call us if you have any questions about your equipment and/or the amount of insulin you should be taking.   - Your long acting insulin is still on your medication list in case you can't get your supplies in a timely manner.   - Please call us if you have any issues setting up your omnipod or your continuous glucose monitor.   Should you have any questions or concerns please call the internal medicine clinic at [phone].     Anthonie Lotito Colbert Coyer, MD PGY-1 Internal Medicine Teaching Progam [facility]

## 2023-10-14 NOTE — Telephone Encounter (Unsigned)
Having trouble getting dexcom g7;  Raven Webb last fill about 30 days ago so should be time to refill.  [facility]. [facility].   [place] says she needs an exemption form  Gave her Omnipod insulin pump pods Needs kit [place]; sending [place] the PA form to complete.   Patient notified.

## 2023-10-15 ENCOUNTER — Other Ambulatory Visit (HOSPITAL_COMMUNITY): Payer: Self-pay

## 2023-10-15 ENCOUNTER — Other Ambulatory Visit: Payer: Self-pay

## 2023-10-15 MED ORDER — DEXCOM G7 SENSOR MISC
3 refills | Status: DC
  Filled 2023-10-15: qty 9, 34d supply, fill #0
  Filled 2023-10-16 – 2023-10-17 (×2): qty 3, 30d supply, fill #0
  Filled 2023-10-17 (×2): qty 3, 28d supply, fill #0
  Filled 2023-10-17: qty 3, 30d supply, fill #0

## 2023-10-15 MED ORDER — DEXCOM G7 RECEIVER DEVI
0 refills | Status: AC
  Filled 2023-10-15: qty 1, 30d supply, fill #0

## 2023-10-16 ENCOUNTER — Other Ambulatory Visit (HOSPITAL_COMMUNITY): Payer: Self-pay

## 2023-10-16 NOTE — Assessment & Plan Note (Signed)
Patient taking amlodipine 10 mg daily, hydrochlorothiazide 25 mg daily, and spironolactone 25 mg daily. BP today 134/90. Denies any chest pain, shortness of breath, heart palpitations, headache, or swelling. Will continue current medication regimen.  Plan - Continue amlodipine 10 mg daily, hydrochlorothiazide 25 mg daily, and spironolactone 25 mg daily

## 2023-10-16 NOTE — Assessment & Plan Note (Addendum)
Patient's Hgb A1c 11.6% today from 12.3% about a month ago. Patient taking Lantus 50 units daily, Ozempic 1 mg weekly injections, and short acting insulin 10 units TID with meals. Tolerating medications well. Does not want to increase Ozempic dose at thistime. Attempted using V-Go system but did not stay on. Prefers omnipod. Discussed instructions/use with diabetic coordinator during visit [name]). Prescribed Omnipod with insulin aspart up to 90 units daily (50 units for basal, ~10 units TID with meals, ~10 units for flexible use/waste) as well as Dexcom CGM. Patient will receive specific instructions for omnipod use, [name] to continue following. Patient to continue current insulin regimen until she can get started with omnipod use. Return to clinic in 1 month for close follow up.

## 2023-10-17 ENCOUNTER — Other Ambulatory Visit: Payer: Self-pay | Admitting: Student

## 2023-10-17 ENCOUNTER — Other Ambulatory Visit (HOSPITAL_COMMUNITY): Payer: Self-pay

## 2023-10-17 ENCOUNTER — Other Ambulatory Visit: Payer: Self-pay | Admitting: Internal Medicine

## 2023-10-17 DIAGNOSIS — E118 Type 2 diabetes mellitus with unspecified complications: Secondary | ICD-10-CM

## 2023-10-17 DIAGNOSIS — E119 Type 2 diabetes mellitus without complications: Secondary | ICD-10-CM

## 2023-10-19 ENCOUNTER — Other Ambulatory Visit (HOSPITAL_COMMUNITY): Payer: Self-pay

## 2023-10-19 MED ORDER — OZEMPIC (1 MG/DOSE) 4 MG/3ML ~~LOC~~ SOPN
1.0000 mg | PEN_INJECTOR | SUBCUTANEOUS | 3 refills | Status: DC
  Filled 2023-10-19: qty 3, 28d supply, fill #0

## 2023-10-19 MED ORDER — INSULIN ASPART 100 UNIT/ML IJ SOLN
76.0000 [IU] | Freq: Every day | INTRAMUSCULAR | 2 refills | Status: DC
  Filled 2023-10-19 – 2023-10-29 (×2): qty 20, 26d supply, fill #0
  Filled 2023-11-20: qty 20, 26d supply, fill #1
  Filled 2023-12-15: qty 20, 26d supply, fill #2

## 2023-10-19 NOTE — Telephone Encounter (Addendum)
I called and spoke with the patient and pharmacy. Per the pharmacy the patient picked up her refill on 1/16, it is too soon to refill the rx. Patient is aware. Patient stated she has 1 vial of novolog and some of her inpen left.

## 2023-10-19 NOTE — Progress Notes (Signed)
Internal Medicine Clinic Attending  Case discussed with the resident at the time of the visit.  We reviewed the resident's history and exam and pertinent patient test results.  I agree with the assessment, diagnosis, and plan of care documented in the resident's note.

## 2023-10-20 ENCOUNTER — Other Ambulatory Visit: Payer: Self-pay | Admitting: Student

## 2023-10-20 DIAGNOSIS — E119 Type 2 diabetes mellitus without complications: Secondary | ICD-10-CM

## 2023-10-26 ENCOUNTER — Encounter: Payer: Self-pay | Admitting: Dietician

## 2023-10-28 ENCOUNTER — Encounter: Payer: Self-pay | Admitting: Student

## 2023-10-28 ENCOUNTER — Ambulatory Visit: Payer: Self-pay | Admitting: Dietician

## 2023-10-28 ENCOUNTER — Encounter (HOSPITAL_COMMUNITY): Payer: Self-pay

## 2023-10-28 ENCOUNTER — Other Ambulatory Visit (HOSPITAL_COMMUNITY): Payer: Self-pay

## 2023-10-28 ENCOUNTER — Telehealth: Payer: Self-pay | Admitting: Student

## 2023-10-28 ENCOUNTER — Other Ambulatory Visit: Payer: Self-pay

## 2023-10-28 ENCOUNTER — Telehealth: Payer: Self-pay

## 2023-10-28 ENCOUNTER — Other Ambulatory Visit: Payer: Self-pay | Admitting: Dietician

## 2023-10-28 DIAGNOSIS — E118 Type 2 diabetes mellitus with unspecified complications: Secondary | ICD-10-CM

## 2023-10-28 DIAGNOSIS — I10 Essential (primary) hypertension: Secondary | ICD-10-CM

## 2023-10-28 DIAGNOSIS — E119 Type 2 diabetes mellitus without complications: Secondary | ICD-10-CM

## 2023-10-28 MED ORDER — DEXCOM G6 TRANSMITTER MISC
3 refills | Status: DC
  Filled 2023-10-28 (×2): qty 1, 90d supply, fill #0
  Filled 2024-01-26: qty 1, 90d supply, fill #1

## 2023-10-28 MED ORDER — OZEMPIC (2 MG/DOSE) 8 MG/3ML ~~LOC~~ SOPN
2.0000 mg | PEN_INJECTOR | SUBCUTANEOUS | 2 refills | Status: DC
  Filled 2023-10-28: qty 6, fill #0
  Filled 2023-10-28 (×2): qty 3, 28d supply, fill #0
  Filled 2023-11-20 – 2023-11-23 (×5): qty 3, 28d supply, fill #1
  Filled 2023-11-24 – 2023-12-18 (×2): qty 3, 28d supply, fill #2
  Filled 2024-01-06 – 2024-01-08 (×4): qty 3, 28d supply, fill #3
  Filled 2024-01-31: qty 3, 28d supply, fill #4
  Filled 2024-02-22: qty 3, 28d supply, fill #5

## 2023-10-28 MED ORDER — DEXCOM G6 SENSOR MISC
11 refills | Status: DC
  Filled 2023-10-28 (×2): qty 3, 30d supply, fill #0
  Filled 2023-11-20: qty 3, 30d supply, fill #1
  Filled 2023-12-15: qty 3, 30d supply, fill #2
  Filled 2024-01-08 (×2): qty 3, 30d supply, fill #3
  Filled 2024-01-31: qty 3, 30d supply, fill #4

## 2023-10-28 MED ORDER — AMLODIPINE BESYLATE 10 MG PO TABS
10.0000 mg | ORAL_TABLET | Freq: Every day | ORAL | 3 refills | Status: DC
  Filled 2023-10-28: qty 90, 90d supply, fill #0
  Filled 2023-10-28: qty 30, 30d supply, fill #0
  Filled 2023-10-28: qty 90, 90d supply, fill #0
  Filled 2024-01-31: qty 90, 90d supply, fill #1
  Filled 2024-04-26: qty 90, 90d supply, fill #2
  Filled 2024-07-19: qty 90, 90d supply, fill #3

## 2023-10-28 NOTE — Telephone Encounter (Signed)
 Decision:Approved  Vonn Roys (Key: [id number]) PA Case ID #: [id number] Rx #: [id number] Need Help? Call us  at [phone] Outcome Additional Information Required We received a prior authorization request for the member and product listed above. The Community and [facility] Prior Authorization Team is not able to review this request because the requested medication has been previously approved under [id number]. Based on the information reviewed, the requested prescription is currently authorized for coverage by the plan until 2024-04-26. Please resubmit this request within 30 days of authorization expiration date. Drug Dexcom G6 Sensor ePA cloud logo Form [employer] Medicaid Electronic Prior Authorization Form [phone] NCPDP) Original Claim Info 75 PA Req, Dr submit ePA at [employer].comOr MD Call [phone], Possible 3 DSSUBMIT 03 Lvl of Service PA# 72 PA TYPE8Drug Requires Prior Authorization

## 2023-10-28 NOTE — Telephone Encounter (Signed)
Prior Authorization for patient (Dexcom G6 Sensor) came through on cover my meds was submitted with last office notes and labs awaiting approval or denial.  KEY: [id number]

## 2023-10-28 NOTE — Telephone Encounter (Signed)
 I spoke with Raven Webb on the phone. Patient's identity was confirmed using two patient specific identifiers. We discussed her Ozempic  dosing.  Doing well on current Ozempic  dose, denies nausea, vomiting, abdominal pain, bloating, constipation, or diarrhea.  She has been on Ozempic  1 mg weekly for greater than a month, and has been on Ozempic  2 mg in the past and had no trouble with that dosing.  I will go ahead and increase her Ozempic  to 2 mg weekly.

## 2023-10-28 NOTE — Progress Notes (Signed)
 Start time:  1015 am   End time:  1145 am  Patient is here today 10/28/23 for training on the Omnipod 5 Insulin  Pump. Denman Gravely, RN, CDCES,  her Omnipod 5 Insulet trainer is also present  It was confirmed that the patient was aware of the following: Blood glucose (BG) testing Treating hypoglycemia Hyperglycemia Carbohydrate counting   (Patient will/will be using carbohydrate counting with use of this pump.) Sick day management  Patient was instructed on the following: Have the following supplies available to be prepared: Omnipod PDM and pods Insulin  and syringe Blood glucose meter, strips, lancets, lancing device Glucose tablets/fast acting source of carbohydrate/Glucagon When to reorder (when last box of pods is opened)  System Overview Communication process/distance Storage guidelines Diagnostic tests (CT scans/MRI/Xrays) Travel guidelines  Pod: Fill port/adhesive/needle cap/pink slide insert/Waterproof  PDM Battery/button layout/wireless updates (software updates)  PDM and iphone Omnipod 5 app Settings Pump Therapy Order Form with settings below Basic settings - Personalized lock screen/time/time zone/date/date format Patient was able to accurately enter pump settings into PDM. Pump settings as of 10/28/23  Pod filled with 200 units of Novolog  Max Basal Rate = 3U/hr 12am-12 am Basal rate = 1.4 U/hr 12am-8 am Target Glucose : 120mg /dL correct above 150mg /dL 8 jf-87 am Target Glucose : 110mg /dL correct above 140mg /dL 87jf-87 am Insulin  to Carb ratio = 6.6g/unit  12am -12 am Correction factor = 25 mg/dL/unit  Duration of insulin  action - 4hrs Max Bolus= 30 Units Pod Activation Change Pod  Room temperature insulin  Fill syringe - min/max amounts DO NOT prefill Pod Site selection/rotation & prep Automated cannula insertion - check infusion site/viewing window & pink slide insert Blood glucose reminder 1.5 hours after insertion When to change POD Patient was able  to place pod and view insert.  Home Screen Status Bar, Menu Icon, Notification/Alarms Tabs Dashboard - IOB (if Calculator ON) - Instructed to leave calculator on Basal (Temp Basal if Temp Basal running) Pod Info - View Pod detail Last Bolus, Last BG Bolus button  Menu icon PDM function - Temp Basal, Pod, Enter BG, Suspend Manage Programs & Presets - Basal programs, temp basal presets, bolus presets Food Library ([employer] app, other tools for carbohydrate counting) History - Notifications & Alarms, Insulin  & BG History Settings - PDM Device, Pod sites, Reminders, Blood Glucose, Basal & Temp Basal, Bolus About  Advanced Features (to be discussed at a further date based on patient needs) Extended bolus Temp basal rate Additional basal programs Presets - Temp basal/Bolus presets  Notifications & Alarms Custom reminders Pod Expiration alert Low Reservoir alert  Advisory & Hazard Alarms Advisory alarms - intermittent tones - response required Hazard alarm - Continuous vibration, and Tone - urgent attention required - Pod Expired, Empty Reservoir, Occlusion, Pod Error, Auto Off, PDM Error, System Error  Ongoing [employer] invitation provided Omnipod app(s) Register for [employer] 24/7 Customer Care  [phone] Showed patient Customer's Bill of Rights and Responsibilities and instructed them to review.  Handouts Provided: Carbohydrate counting by Omnipod  Patient would like to use the Dexcom G6 so she can use her iPhone as the controller for her pump. Dexcom G7 is not compatible with her phone yet.  She gave herself a 10.4 unit correction for her blood sugar today that was over 300 mg/dL/. She had been instructed to stop her lantus  yesterday to prevent hypoglycemia today with pump start. Follow up next week when she is ready for a pod change and has the  Dexcom G6 sensor and transmitter to transfer from using the Omnipod 5 controller to her  iphone.  Arland Hole, RD 10/28/2023 11:59 AM.

## 2023-10-28 NOTE — Telephone Encounter (Signed)
 Decision:Approved  Vonn Roys (Key: [facility]) PA Case ID #: [id number] Rx #: [id number] Need Help? Call us  at [phone] Outcome Approved today by [facility] 2017 NCPDP Request Reference Number: [id number]. DEXCOM G6 MIS TRANSMIT is approved through 04/26/2024. For further questions, call [employer] at [phone]. Authorization Expiration Date: 04/26/2024 Drug Dexcom G6 Transmitter ePA cloud logo Form [employer] Medicaid Electronic Prior Authorization Form [phone] NCPDP) Original Claim Info 75 PA Req, Dr submit ePA at [employer].comOr MD Call [phone], Possible 3 DSSUBMIT 03 Lvl of Service PA# 72 PA TYPE8Drug Requires Prior Authorization

## 2023-10-28 NOTE — Patient Instructions (Signed)
 Today we discussed:  Blood glucose (BG) testing Treating hypoglycemia Hyperglycemia Carbohydrate counting   (Patient will/will be using carbohydrate counting with use of this pump.) Sick day management   Have the following supplies available to be prepared: Omnipod PDM and pods Insulin  and syringe Blood glucose meter, strips, lancets, lancing device Glucose tablets/fast acting source of carbohydrate/Glucagon  Supply Reorder [facility] Customer Care [phone] ext [phone] When to reorder (when last box of pods is opened)   Handouts Provided: Insulin  Pump Packet  Patient to contact Jayne the first two weeks and me via MyChart or phone.  We'll talk on Monday about a day for your follow up next week.   My cell is [phone] for after hours and weekends.   Arland [phone]

## 2023-10-28 NOTE — Telephone Encounter (Signed)
 Received a message regarding patient Ozempic  dosing. Seems to have been on 2mg  in the past, and has been one 1mg  weekly for >1 month. Would be in favor of increasing if no side effects. I attempt to reach Raven Webb on the phone today. Patient was unavailable to answer the phone.

## 2023-10-28 NOTE — Telephone Encounter (Signed)
 Patient would like to use Dexcom G6 so she can use her iphone as her controller for her Omnipod 5 insulin  pump. Request prescription for same.

## 2023-10-28 NOTE — Telephone Encounter (Signed)
 Prior Authorization for patient (Dexcom G6 Transmitter) came through on cover my meds was submitted with last office notes and labs awaiting approval or denial.  KXF:[id number]

## 2023-10-29 ENCOUNTER — Other Ambulatory Visit (HOSPITAL_COMMUNITY): Payer: Self-pay

## 2023-10-30 ENCOUNTER — Other Ambulatory Visit (HOSPITAL_COMMUNITY): Payer: Self-pay

## 2023-11-02 ENCOUNTER — Telehealth: Payer: Self-pay | Admitting: Dietician

## 2023-11-02 NOTE — Telephone Encounter (Signed)
 Over weekend, post prandial blood sugars going up over 300. Pump trainer suggested to decrease her Insulin  to carb ratio to 1 unit to 5.5 g carbs from 1 units for 6.6 grams carb.

## 2023-11-03 ENCOUNTER — Other Ambulatory Visit (HOSPITAL_COMMUNITY): Payer: Self-pay

## 2023-11-03 NOTE — Telephone Encounter (Signed)
Relayed message to pump trainer to have patient change ICR to 5.5.

## 2023-11-16 ENCOUNTER — Other Ambulatory Visit: Payer: Self-pay

## 2023-11-16 ENCOUNTER — Ambulatory Visit: Payer: Medicaid Other

## 2023-11-16 ENCOUNTER — Encounter: Payer: Medicaid Other | Admitting: Student

## 2023-11-16 VITALS — BP 136/86 | HR 73 | Wt 224.5 lb

## 2023-11-16 DIAGNOSIS — Z3042 Encounter for surveillance of injectable contraceptive: Secondary | ICD-10-CM | POA: Diagnosis not present

## 2023-11-16 DIAGNOSIS — E66811 Obesity, class 1: Secondary | ICD-10-CM | POA: Insufficient documentation

## 2023-11-16 MED ORDER — MEDROXYPROGESTERONE ACETATE 150 MG/ML IM SUSY
150.0000 mg | PREFILLED_SYRINGE | Freq: Once | INTRAMUSCULAR | Status: AC
  Administered 2023-11-16: 150 mg via INTRAMUSCULAR

## 2023-11-16 NOTE — Progress Notes (Deleted)
***       CC: Routine Follow Up for diabetes after last office visit 10/14/2023  HPI:  Raven Webb is a 38 y.o. female with pertinent PMH of HTN, T2DM on insulin pump, GERD, and class I obesity who presents as above. Please see assessment and plan below for further details.  Review of Systems:   Pertinent items noted in HPI and/or A&P.  Physical Exam:  There were no vitals filed for this visit.  Constitutional:***. In no acute distress. HEENT: Normocephalic, atraumatic, Sclera non-icteric, PERRL, EOM intact Cardio:Regular rate and rhythm. 2+ bilateral {PulseLoc:28294} pulses. Pulm:Clear to auscultation bilaterally. Normal work of breathing on room air. Abdomen: Soft, non-tender, non-distended, positive bowel sounds. VOZ:DGUYQIHK for extremity edema. Skin:Warm and dry. Neuro:Alert and oriented x3. No focal deficit noted. Psych:Pleasant mood and affect.   Assessment & Plan:   No problem-specific Assessment & Plan notes found for this encounter.    Patient {GC/GE:3044014::"discussed with","seen with"} Dr. {NAMES:3044014::"Lau","Guilloud","Hoffman","Machen","Chambliss","Winfrey","Williams","Vincent"}  Rocky Morel, DO Internal Medicine Center Internal Medicine Resident PGY-2 Clinic Phone: [phone] Pager: [phone]

## 2023-11-16 NOTE — Progress Notes (Signed)
 Raven Webb here for Depo-Provera Injection. Injection administered without complication. Patient will return in 3 months for next injection between 02/01/24 and 02/15/24. Next annual visit due December 2025.   Marjo Bicker, RN 11/16/2023  10:38 AM

## 2023-11-20 ENCOUNTER — Other Ambulatory Visit: Payer: Self-pay

## 2023-11-20 ENCOUNTER — Other Ambulatory Visit (HOSPITAL_COMMUNITY): Payer: Self-pay

## 2023-11-23 ENCOUNTER — Other Ambulatory Visit (HOSPITAL_COMMUNITY): Payer: Self-pay

## 2023-11-23 ENCOUNTER — Telehealth: Payer: Self-pay

## 2023-11-23 NOTE — Telephone Encounter (Signed)
 Prior authorization for patient (Ozempic (2 MG/DOSE) 8MG /3ML pen-injectors) came through on cover my meds was submitted with last office notes and labs awaiting approval or denial.  WUJ:[id number]

## 2023-11-23 NOTE — Telephone Encounter (Signed)
 Raven Webb (Key: [id number]) Ozempic (2 MG/DOSE) 8MG Ronny Bacon pen-injectors Form [employer] Medicaid Electronic Prior Authorization Form [phone] NCPDP) Created Message from Plan Request Reference Number: [id number]. OZEMPIC INJ 8MG /3ML is approved through 11/22/2024. For further questions, call [employer] at [phone].Marland Kitchen Authorization Expiration Date: November 22, 2024.

## 2023-11-24 ENCOUNTER — Other Ambulatory Visit (HOSPITAL_COMMUNITY): Payer: Self-pay

## 2023-11-26 ENCOUNTER — Encounter: Payer: Self-pay | Admitting: Student

## 2023-11-26 ENCOUNTER — Other Ambulatory Visit: Payer: Self-pay

## 2023-11-26 DIAGNOSIS — R110 Nausea: Secondary | ICD-10-CM

## 2023-12-08 ENCOUNTER — Other Ambulatory Visit (HOSPITAL_COMMUNITY): Payer: Self-pay

## 2023-12-08 MED ORDER — ONDANSETRON HCL 4 MG PO TABS
4.0000 mg | ORAL_TABLET | Freq: Every day | ORAL | 1 refills | Status: DC | PRN
  Filled 2023-12-08: qty 14, 14d supply, fill #0
  Filled 2024-01-31: qty 14, 14d supply, fill #1

## 2023-12-10 ENCOUNTER — Ambulatory Visit: Payer: Self-pay | Admitting: Student

## 2023-12-15 ENCOUNTER — Other Ambulatory Visit (HOSPITAL_COMMUNITY): Payer: Self-pay

## 2023-12-15 ENCOUNTER — Ambulatory Visit (INDEPENDENT_AMBULATORY_CARE_PROVIDER_SITE_OTHER): Payer: Self-pay | Admitting: Student

## 2023-12-15 VITALS — BP 141/83 | HR 70 | Temp 98.5°F | Ht 67.0 in | Wt 231.7 lb

## 2023-12-15 DIAGNOSIS — Z7985 Long-term (current) use of injectable non-insulin antidiabetic drugs: Secondary | ICD-10-CM

## 2023-12-15 DIAGNOSIS — E118 Type 2 diabetes mellitus with unspecified complications: Secondary | ICD-10-CM | POA: Diagnosis present

## 2023-12-15 DIAGNOSIS — Z794 Long term (current) use of insulin: Secondary | ICD-10-CM | POA: Diagnosis not present

## 2023-12-15 DIAGNOSIS — I10 Essential (primary) hypertension: Secondary | ICD-10-CM

## 2023-12-15 NOTE — Assessment & Plan Note (Signed)
 Blood pressure elevated at 145/73 today.  Patient notes that she has not measured her blood pressure at home for a while, but the last time she measured it at home, it was in the 140s/70s.  She currently is on amlodipine 10 mg daily, HCTZ 25 mg daily, and spironolactone 12.5 mg daily.  Unclear why she is not on an ACE inhibitor or ARB, but will follow-up with microalbumin today.  If her microalbumin is elevated, will likely start on low-dose ACE inhibitor/ARB.  Will also readdress at her follow-up visit in 1 month in which she was counseled on bringing her blood pressure log. - Follow-up microalbumin - Continue amlodipine 10 mg daily, HCTZ 25 mg daily, and spironolactone 12.5 mg daily

## 2023-12-15 NOTE — Assessment & Plan Note (Signed)
 Patient presents today for 70-month follow-up for diabetes.  Her last A1c 2 months ago was 11.6.  She is currently on OmniPod with Dexcom.  Her average fasting glucose is roughly 132.  Patient denies any signs/symptoms today.  The results were discussed with diabetes coordinator [name], who recommends no changes to her regimen at this time.  Will plan to follow-up in 1 month for repeat A1c. - Continue current regimen - Follow-up microalbumin

## 2023-12-15 NOTE — Patient Instructions (Signed)
 Thank you so much for coming to the clinic today!   Please start measuring your blood pressure every 1-2 days and logging this. Please bring this back to your next visit in one month in case we need to make any changes.   We will see you in one month!   If you have any questions please feel free to the call the clinic at anytime at [phone]. It was a pleasure seeing you!  Best, Dr. Rayvon Char

## 2023-12-15 NOTE — Progress Notes (Signed)
 CC: Diabetes follow-up  HPI: [name] is a 38 y.o. female living with a history stated below and presents today for diabetes follow-up. Please see problem based assessment and plan for additional details.  Past Medical History:  Diagnosis Date   GERD (gastroesophageal reflux disease)    Hypertension    IBS (irritable bowel syndrome)    UTI (lower urinary tract infection)     Current Outpatient Medications on File Prior to Visit  Medication Sig Dispense Refill   insulin aspart (NOVOLOG) 100 UNIT/ML injection Use 76 Units daily as directed (Patient not taking: Reported on 11/16/2023) 20 mL 2   Accu-Chek Softclix Lancets lancets USE TO CHECK BLOOD SUGAR 3 TIMES DAILY 100 each 12   albuterol (PROVENTIL HFA) 108 (90 Base) MCG/ACT inhaler Inhale 1-2 puffs into the lungs every 6 (six) hours as needed for wheezing or shortness of breath. (Patient not taking: Reported on 11/16/2023) 18 g 0   amLODipine (NORVASC) 10 MG tablet Take 1 tablet (10 mg total) by mouth daily. 90 tablet 3   Blood Glucose Monitoring Suppl (ACCU-CHEK GUIDE) w/Device KIT 1 each by Does not apply route daily at 6 (six) AM. Use to check blood sugar 3 times daily 1 kit 2   Continuous Glucose Receiver (DEXCOM G7 RECEIVER) DEVI Use as directed 1 each 0   Continuous Glucose Sensor (DEXCOM G6 SENSOR) MISC Use to monitor your blood sugar continuously. Apply new sensor every 10 days. 3 each 11   Continuous Glucose Transmitter (DEXCOM G6 TRANSMITTER) MISC Use to monitor your blood sugar continuously. Apply new transmitter every 90 days. 1 each 3   glucose blood (ACCU-CHEK GUIDE) test strip Use to check blood sugar 3 times daily when you do not have a Continuous glucose monitoring and when CGM tells you to use your meter 100 each 11   hydrochlorothiazide (HYDRODIURIL) 25 MG tablet Take 1 tablet (25 mg total) by mouth daily. 90 tablet 1   insulin aspart (NOVOLOG) 100 UNIT/ML injection Use with omnipod to administer up to 90 units  daily. 10 mL 11   Insulin Disposable Pump (OMNIPOD 5 DEXG7G6 INTRO GEN 5) KIT Use to administer insulin as instructed. 1 kit 0   Insulin Disposable Pump (OMNIPOD 5 DEXG7G6 PODS GEN 5) MISC Use with omnipod to administer up to 90 units daily. 15 each 11   insulin glargine (LANTUS SOLOSTAR) 100 UNIT/ML Solostar Pen Inject 50 Units into the skin at bedtime. (Patient not taking: Reported on 11/16/2023) 15 mL 3   Insulin Pen Needle 32G X 4 MM MISC Use to inject insulin at the same time each day 100 each 3   Insulin Pen Needle 32G X 4 MM MISC Inject 1 Needle into the skin in the morning, at noon, and at bedtime. 100 each 11   medroxyPROGESTERone (DEPO-PROVERA) 150 MG/ML injection Inject 150 mg into the muscle every 3 (three) months.     ondansetron (ZOFRAN) 4 MG tablet Take 1 tablet (4 mg total) by mouth daily as needed for nausea or vomiting. 14 tablet 1   Semaglutide, 2 MG/DOSE, (OZEMPIC, 2 MG/DOSE,) 8 MG/3ML SOPN Inject 2 mg into the skin once a week. 6 mL 2   spironolactone (ALDACTONE) 25 MG tablet Take 0.5 tablets (12.5 mg total) by mouth daily. 30 tablet 3   No current facility-administered medications on file prior to visit.    Family History  Problem Relation Age of Onset   Hypertension Mother    Diabetes Mother  Hypertension Father    Hypertension Sister    Diabetes Sister    Hypertension Maternal Uncle    Kidney disease Maternal Uncle     Social History   Socioeconomic History   Marital status: Single    Spouse name: Not on file   Number of children: Not on file   Years of education: Not on file   Highest education level: Not on file  Occupational History   Not on file  Tobacco Use   Smoking status: Never   Smokeless tobacco: Never  Vaping Use   Vaping status: Never Used  Substance and Sexual Activity   Alcohol use: No    Alcohol/week: 0.0 standard drinks of alcohol   Drug use: No   Sexual activity: Not Currently    Birth control/protection: Injection  Other Topics  Concern   Not on file  Social History Narrative   Not on file   Social Drivers of Health   Financial Resource Strain: Not on file  Food Insecurity: No Food Insecurity (10/15/2022)   Hunger Vital Sign    Worried About Running Out of Food in the Last Year: Never true    Ran Out of Food in the Last Year: Never true  Transportation Needs: Unknown (10/15/2022)   PRAPARE - Administrator, Civil Service (Medical): No    Lack of Transportation (Non-Medical): Not on file  Physical Activity: Not on file  Stress: Not on file  Social Connections: Moderately Integrated (10/15/2022)   Social Connection and Isolation Panel [NHANES]    Frequency of Communication with Friends and Family: More than three times a week    Frequency of Social Gatherings with Friends and Family: More than three times a week    Attends Religious Services: More than 4 times per year    Active Member of [employer] or Organizations: Yes    Attends Banker Meetings: More than 4 times per year    Marital Status: Never married  Intimate Partner Violence: Not At Risk (10/15/2022)   Humiliation, Afraid, Rape, and Kick questionnaire    Fear of Current or Ex-Partner: No    Emotionally Abused: No    Physically Abused: No    Sexually Abused: No    Review of Systems: ROS negative except for what is noted on the assessment and plan.  Vitals:   12/15/23 0947 12/15/23 1006  BP: (!) 145/73 (!) 141/83  Pulse: 74 70  Temp: 98.5 F (36.9 C)   TempSrc: Oral   SpO2: 98%   Weight: 231 lb 11.2 oz (105.1 kg)   Height: 5\' 7"  (1.702 m)     Physical Exam: Constitutional: well-appearing in no acute distress HENT: normocephalic atraumatic, mucous membranes moist Eyes: conjunctiva non-erythematous Neck: supple Cardiovascular: regular rate and rhythm, no m/r/g Pulmonary/Chest: normal work of breathing on room air, lungs clear to auscultation bilaterally Abdominal: soft, non-tender, non-distended MSK: normal bulk and  tone Neurological: alert & oriented x 3, 5/5 strength in bilateral upper and lower extremities, normal gait Skin: warm and dry  Assessment & Plan:   DM (diabetes mellitus), type 2 with complications [facility]) Patient presents today for 62-month follow-up for diabetes.  Her last A1c 2 months ago was 11.6.  She is currently on OmniPod with Dexcom.  Her average fasting glucose is roughly 132.  Patient denies any signs/symptoms today.  The results were discussed with diabetes coordinator [name], who recommends no changes to her regimen at this time.  Will plan to follow-up in  1 month for repeat A1c. - Continue current regimen - Follow-up microalbumin  Hypertension Blood pressure elevated at 145/73 today.  Patient notes that she has not measured her blood pressure at home for a while, but the last time she measured it at home, it was in the 140s/70s.  She currently is on amlodipine 10 mg daily, HCTZ 25 mg daily, and spironolactone 12.5 mg daily.  Unclear why she is not on an ACE inhibitor or ARB, but will follow-up with microalbumin today.  If her microalbumin is elevated, will likely start on low-dose ACE inhibitor/ARB.  Will also readdress at her follow-up visit in 1 month in which she was counseled on bringing her blood pressure log. - Follow-up microalbumin - Continue amlodipine 10 mg daily, HCTZ 25 mg daily, and spironolactone 12.5 mg daily  Patient discussed with Dr. Susa Raring, MD  [facility] Internal Medicine, PGY-1 Date 12/15/2023 Time 11:59 AM

## 2023-12-17 LAB — MICROALBUMIN / CREATININE URINE RATIO
Creatinine, Urine: 105.5 mg/dL
Microalb/Creat Ratio: 7 mg/g{creat} (ref 0–29)
Microalbumin, Urine: 6.9 ug/mL

## 2023-12-17 NOTE — Progress Notes (Signed)
 Internal Medicine Clinic Attending  Case discussed with the resident at the time of the visit.  We reviewed the resident's history and exam and pertinent patient test results.  I agree with the assessment, diagnosis, and plan of care documented in the resident's note.

## 2023-12-17 NOTE — Addendum Note (Signed)
 Addended by: Derrek Monaco on: 12/17/2023 08:59 AM   Modules accepted: Level of Service

## 2023-12-18 ENCOUNTER — Encounter: Payer: Self-pay | Admitting: Student

## 2023-12-18 ENCOUNTER — Other Ambulatory Visit (HOSPITAL_COMMUNITY): Payer: Self-pay

## 2023-12-18 NOTE — Progress Notes (Signed)
 Sent patient MyChart message noting normal microalbumin. No changes at this time.

## 2023-12-21 ENCOUNTER — Other Ambulatory Visit: Payer: Self-pay

## 2023-12-21 ENCOUNTER — Other Ambulatory Visit: Payer: Self-pay | Admitting: Dietician

## 2023-12-21 ENCOUNTER — Other Ambulatory Visit (HOSPITAL_COMMUNITY): Payer: Self-pay

## 2023-12-21 DIAGNOSIS — E118 Type 2 diabetes mellitus with unspecified complications: Secondary | ICD-10-CM

## 2023-12-21 MED ORDER — NOVOLOG FLEXPEN 100 UNIT/ML ~~LOC~~ SOPN
PEN_INJECTOR | SUBCUTANEOUS | 11 refills | Status: DC
  Filled 2023-12-21: qty 15, 22d supply, fill #0
  Filled 2024-04-26 – 2024-05-30 (×2): qty 15, 22d supply, fill #1
  Filled 2024-06-17 – 2024-08-23 (×3): qty 15, 22d supply, fill #2

## 2023-12-21 MED ORDER — INSULIN PEN NEEDLE 32G X 4 MM MISC
11 refills | Status: AC
  Filled 2023-12-21: qty 100, 30d supply, fill #0
  Filled 2024-04-26 – 2024-07-04 (×2): qty 100, 30d supply, fill #1

## 2023-12-21 NOTE — Telephone Encounter (Signed)
 Raven Webb calls requesting a Novolog pen for times when she doesn't have pod. She uses about 70 units a day in her pump.she does need pen needles as well.   she would like [place] to send the prescriptions to [facility] pharmacy. Needs pen needles as well. Reminded her of expiration of pens when opened and unopened.

## 2023-12-22 ENCOUNTER — Other Ambulatory Visit (HOSPITAL_COMMUNITY): Payer: Self-pay

## 2024-01-06 ENCOUNTER — Other Ambulatory Visit (HOSPITAL_COMMUNITY): Payer: Self-pay

## 2024-01-06 ENCOUNTER — Telehealth: Payer: Self-pay | Admitting: Dietician

## 2024-01-06 NOTE — Telephone Encounter (Signed)
 Patient states she left her Ozempic in the refrigerator in a hotel room and when she called them back it had already been thrown away. Spoke with pharmacy Tech about giving her a 2mg  sample ozempic  pen. She asked that patient call her pharmacy to call insurance or insurance directly to request an early fill override. If they cannot do this she was asked to call us  back.

## 2024-01-07 ENCOUNTER — Other Ambulatory Visit (HOSPITAL_COMMUNITY): Payer: Self-pay

## 2024-01-08 ENCOUNTER — Other Ambulatory Visit: Payer: Self-pay

## 2024-01-08 ENCOUNTER — Other Ambulatory Visit (HOSPITAL_COMMUNITY): Payer: Self-pay

## 2024-01-11 ENCOUNTER — Other Ambulatory Visit (HOSPITAL_COMMUNITY): Payer: Self-pay

## 2024-01-12 ENCOUNTER — Encounter: Payer: Self-pay | Admitting: Student

## 2024-01-12 ENCOUNTER — Ambulatory Visit: Admitting: Student

## 2024-01-12 ENCOUNTER — Encounter: Admitting: Student

## 2024-01-12 ENCOUNTER — Other Ambulatory Visit (HOSPITAL_COMMUNITY): Payer: Self-pay

## 2024-01-12 VITALS — BP 143/95 | HR 71 | Temp 97.4°F | Ht 67.0 in | Wt 236.9 lb

## 2024-01-12 DIAGNOSIS — Z7985 Long-term (current) use of injectable non-insulin antidiabetic drugs: Secondary | ICD-10-CM

## 2024-01-12 DIAGNOSIS — I10 Essential (primary) hypertension: Secondary | ICD-10-CM

## 2024-01-12 DIAGNOSIS — E118 Type 2 diabetes mellitus with unspecified complications: Secondary | ICD-10-CM | POA: Diagnosis not present

## 2024-01-12 LAB — POCT GLYCOSYLATED HEMOGLOBIN (HGB A1C): Hemoglobin A1C: 7.2 % — AB (ref 4.0–5.6)

## 2024-01-12 LAB — GLUCOSE, CAPILLARY: Glucose-Capillary: 121 mg/dL — ABNORMAL HIGH (ref 70–99)

## 2024-01-12 MED ORDER — LISINOPRIL 20 MG PO TABS
40.0000 mg | ORAL_TABLET | Freq: Every day | ORAL | 3 refills | Status: DC
  Filled 2024-01-12: qty 60, 30d supply, fill #0
  Filled 2024-02-22: qty 60, 30d supply, fill #1

## 2024-01-12 NOTE — Progress Notes (Signed)
 Established Patient Office Visit  Subjective   Patient ID: Raven Webb, female    DOB: [birth year]  Age: 38 y.o. MRN: [id number]  Chief Complaint  Patient presents with   Follow-up    FOLLOWUP DM   Patient is a 38 y.o. with a past medical history stated below who presents today for follow-up for diabetes and hypertension. Please see problem based assessment and plan for additional details.     Past Medical History:  Diagnosis Date   GERD (gastroesophageal reflux disease)    Hypertension    IBS (irritable bowel syndrome)    UTI (lower urinary tract infection)    Review of Systems  Cardiovascular:  Negative for chest pain and palpitations.  Gastrointestinal:  Negative for abdominal pain, nausea and vomiting.  Genitourinary:  Negative for dysuria.  Neurological:  Negative for headaches.     Objective:     BP (!) 143/95 (BP Location: Right Arm, Patient Position: Sitting, Cuff Size: Normal)   Pulse 71   Temp (!) 97.4 F (36.3 C) (Oral)   Ht 5\' 7"  (1.702 m)   Wt 236 lb 14.4 oz (107.5 kg)   SpO2 94%   BMI 37.10 kg/m  BP Readings from Last 3 Encounters:  01/12/24 (!) 143/95  12/15/23 (!) 141/83  11/16/23 136/86   Wt Readings from Last 3 Encounters:  01/12/24 236 lb 14.4 oz (107.5 kg)  12/15/23 231 lb 11.2 oz (105.1 kg)  11/16/23 224 lb 8 oz (101.8 kg)     Physical Exam HENT:     Head: Normocephalic and atraumatic.  Cardiovascular:     Rate and Rhythm: Normal rate and regular rhythm.     Heart sounds: Normal heart sounds.  Pulmonary:     Effort: Pulmonary effort is normal.  Abdominal:     General: Bowel sounds are normal.     Palpations: Abdomen is soft.     Tenderness: There is no abdominal tenderness.  Musculoskeletal:        General: No swelling.  Skin:    General: Skin is warm.  Neurological:     General: No focal deficit present.     Mental Status: She is alert.  Psychiatric:        Mood and Affect: Mood normal.    Results for orders placed or  performed in visit on 01/12/24  Glucose, capillary  Result Value Ref Range   Glucose-Capillary 121 (H) 70 - 99 mg/dL  POC Hbg Y4I  Result Value Ref Range   Hemoglobin A1C 7.2 (A) 4.0 - 5.6 %   HbA1c POC (<> result, manual entry)     HbA1c, POC (prediabetic range)     HbA1c, POC (controlled diabetic range)      Last hemoglobin A1c Lab Results  Component Value Date   HGBA1C 7.2 (A) 01/12/2024    The ASCVD Risk score (Arnett DK, et al., 2019) failed to calculate for the following reasons:   The 2019 ASCVD risk score is only valid for ages 61 to 42    Assessment & Plan:   Problem List Items Addressed This Visit     Hypertension   Blood pressure today 140/102 and 143/95. Blood pressure at last month's visit 145/73. Patient denies any chest pain, abdominal pain, headache, or nausea/vomiting. Does Endorse feeling stressed as her father was recently hospitalized. Patient's BP regimen includes amlodipine  10 mg daily, hydrochlorothiazide  25 mg daily, and spironolactone  12.5 mg daily, states she has been taking all of them consistently. Given adherence  to medications and continuously elevated blood pressures, discussed adding back lisinopril  which patient was on previously. Patient agreeable to plan. Discussed elevated potassium levels as a possible side effect of medications, K 3.27 August 2023. Will repeat BMP at next visit. Also discussed teratogenic effects of medication with patient confirming the use of Depo Provera  for birth control, next dose due in May.  Plan - START lisinopril  40 mg daily  - Continue amlodipine  10 mg daily, hydrochlorothiazide  25 mg daily, and spironolactone  12.5 mg daily - Return to clinic in 1 month for blood pressure check and medication management, get BMP       Relevant Medications   lisinopril  (ZESTRIL ) 20 MG tablet   DM (diabetes mellitus), type 2 with complications (HCC) - Primary   Patient's A1c today 7.2% compared to 11.6% in January. Patient happy with  progress. Currently on OmniPod with Dexcom and Ozempic  2 mg weekly injections (takes it on Fridays). Denies any issues with medications. Abe Abed helped to review CGM data and make adjustments to her OmniPod. Patient only endorsed one low blood glucose event on Sunday when she was more active, corrected easily with a snack, and this was reflected in her CGM data. Microalbumin/Cr from last visit 7. Will make some adjustments to patients' OmniPod today with Donna's help.  Plan:  - Continue OmniPod with Dexcom, change target daytime and nighttime glucose to 110 (previously 110 daytime and 120 nighttime) - Continue Ozempic  2 mg weekly injections      Relevant Medications   lisinopril  (ZESTRIL ) 20 MG tablet   Other Relevant Orders   POC Hbg A1C (Completed)   Patient discussed with Dr. Adriane Albe.  Return in about 4 weeks (around 02/09/2024) for Blood pressure check, BMP, medication adjustments as needed.   Edeline Greening Arellano Zameza, MD PGY-1, [facility]

## 2024-01-12 NOTE — Patient Instructions (Addendum)
 Thank you, Ms.Chanley Laird Pih for allowing us  to provide your care today. Today we discussed your diabetes and blood pressure.   I have ordered the following labs for you:  Lab Orders         Glucose, capillary         POC Hbg A1C      Tests ordered today:  None  Referrals ordered today:   Referral Orders  No referral(s) requested today     I have ordered the following medication/changed the following medications:   Stop the following medications: Medications Discontinued During This Encounter  Medication Reason   insulin  glargine (LANTUS  SOLOSTAR) 100 UNIT/ML Solostar Pen    insulin  aspart (NOVOLOG ) 100 UNIT/ML injection      Start the following medications: Meds ordered this encounter  Medications   lisinopril  (ZESTRIL ) 20 MG tablet    Sig: Take 2 tablets (40 mg total) by mouth daily.    Dispense:  60 tablet    Refill:  3     Follow up: 1 month for blood pressure follow up   Remember:   - Your daily glucose target has been adjusted to 110   - Please continue to take your Ozempic  2 mg injection weekly, let us  know if you have any issues getting the medication or if you develop any unwanted side effects  - For your blood pressure: Please START taking lisinopril  40 mg daily. We discussed the need for continued birth control given the risk of birth defects if you become pregnant while taking this medication. You have tolerated this medication well in the past, but if you develop any unwanted side effects such as lightheadedness, headache, or urinary problems please call our clinic   - We will see you in 1 month to check your blood pressure and make medication adjustments as needed. We will also check your electrolytes as some of your medications can impact your potassium levels.   Should you have any questions or concerns please call the internal medicine clinic at [phone].     Brinley Treanor Arellano Zameza, MD PGY-1 Internal Medicine Teaching Progam [facility]  Internal Medicine Center

## 2024-01-12 NOTE — Assessment & Plan Note (Signed)
 Blood pressure today 140/102 and 143/95. Blood pressure at last month's visit 145/73. Patient denies any chest pain, abdominal pain, headache, or nausea/vomiting. Does Endorse feeling stressed as her father was recently hospitalized. Patient's BP regimen includes amlodipine  10 mg daily, hydrochlorothiazide  25 mg daily, and spironolactone  12.5 mg daily, states she has been taking all of them consistently. Given adherence to medications and continuously elevated blood pressures, discussed adding back lisinopril  which patient was on previously. Patient agreeable to plan. Discussed elevated potassium levels as a possible side effect of medications, K 3.27 August 2023. Will repeat BMP at next visit. Also discussed teratogenic effects of medication with patient confirming the use of Depo Provera  for birth control, next dose due in May.  Plan - START lisinopril  40 mg daily  - Continue amlodipine  10 mg daily, hydrochlorothiazide  25 mg daily, and spironolactone  12.5 mg daily - Return to clinic in 1 month for blood pressure check and medication management, get BMP

## 2024-01-12 NOTE — Assessment & Plan Note (Signed)
 Patient's A1c today 7.2% compared to 11.6% in January. Patient happy with progress. Currently on OmniPod with Dexcom and Ozempic  2 mg weekly injections (takes it on Fridays). Denies any issues with medications. Abe Abed helped to review CGM data and make adjustments to her OmniPod. Patient only endorsed one low blood glucose event on Sunday when she was more active, corrected easily with a snack, and this was reflected in her CGM data. Microalbumin/Cr from last visit 7. Will make some adjustments to patients' OmniPod today with Donna's help.  Plan:  - Continue OmniPod with Dexcom, change target daytime and nighttime glucose to 110 (previously 110 daytime and 120 nighttime) - Continue Ozempic  2 mg weekly injections

## 2024-01-13 ENCOUNTER — Other Ambulatory Visit (HOSPITAL_COMMUNITY): Payer: Self-pay

## 2024-01-13 LAB — HM DIABETES EYE EXAM

## 2024-01-14 ENCOUNTER — Encounter: Payer: Self-pay | Admitting: Obstetrics & Gynecology

## 2024-01-18 ENCOUNTER — Other Ambulatory Visit (HOSPITAL_COMMUNITY): Payer: Self-pay

## 2024-01-18 MED ORDER — INSULIN ASPART 100 UNIT/ML IJ SOLN
90.0000 [IU] | Freq: Every day | INTRAMUSCULAR | 12 refills | Status: DC
  Filled 2024-01-18: qty 30, 33d supply, fill #0
  Filled 2024-02-22: qty 30, 33d supply, fill #1
  Filled 2024-03-25: qty 30, 33d supply, fill #2
  Filled 2024-05-03: qty 30, 33d supply, fill #3

## 2024-01-19 ENCOUNTER — Ambulatory Visit (HOSPITAL_COMMUNITY)
Admission: EM | Admit: 2024-01-19 | Discharge: 2024-01-19 | Disposition: A | Discharge status: Home / Self Care | Attending: Family Medicine | Admitting: Family Medicine

## 2024-01-19 ENCOUNTER — Ambulatory Visit (INDEPENDENT_AMBULATORY_CARE_PROVIDER_SITE_OTHER)

## 2024-01-19 ENCOUNTER — Encounter (HOSPITAL_COMMUNITY): Payer: Self-pay

## 2024-01-19 DIAGNOSIS — M7989 Other specified soft tissue disorders: Secondary | ICD-10-CM | POA: Diagnosis not present

## 2024-01-19 DIAGNOSIS — M79645 Pain in left finger(s): Secondary | ICD-10-CM

## 2024-01-19 NOTE — ED Triage Notes (Signed)
 Here for L-ring finger injury after pulling dog by his chain. Patient reports swelling and pain. Last Tetanus is unknown.

## 2024-01-19 NOTE — ED Provider Notes (Signed)
 [facility]    CSN: [id number] Arrival date & time: 01/19/24  0803      History   Chief Complaint No chief complaint on file.   HPI Raven Webb is a 38 y.o. female.   Patient is here for left finger pain/injury.  She was walking her dog, the leash got around the finger and the dog pulled.  Having pain and swelling to the finger since then.        Past Medical History:  Diagnosis Date   GERD (gastroesophageal reflux disease)    Hypertension    IBS (irritable bowel syndrome)    UTI (lower urinary tract infection)     Patient Active Problem List   Diagnosis Date Noted   Class 1 obesity 11/16/2023   DM (diabetes mellitus), type 2 with complications (HCC) 08/08/2019   Migraine 05/06/2017   GERD (gastroesophageal reflux disease) 03/31/2013   Chronic nausea 02/24/2013   Healthcare maintenance 01/25/2013   Hypertension 03/13/2008    Past Surgical History:  Procedure Laterality Date   WISDOM TOOTH EXTRACTION      OB History     Gravida  4   Para  3   Term  1   Preterm  2   AB  1   Living  3      SAB  1   IAB      Ectopic      Multiple      Live Births               Home Medications    Prior to Admission medications   Medication Sig Start Date End Date Taking? Authorizing Provider  Accu-Chek Softclix Lancets lancets USE TO CHECK BLOOD SUGAR 3 TIMES DAILY 01/09/23  Yes Cleven Dallas, DO  albuterol  (PROVENTIL  HFA) 108 (90 Base) MCG/ACT inhaler Inhale 1-2 puffs into the lungs every 6 (six) hours as needed for wheezing or shortness of breath. 09/24/20  Yes Bloomfield, Carley D, DO  amLODipine  (NORVASC ) 10 MG tablet Take 1 tablet (10 mg total) by mouth daily. 10/28/23  Yes Sheree Dieter, MD  Blood Glucose Monitoring Suppl (ACCU-CHEK GUIDE) w/Device KIT 1 each by Does not apply route daily at 6 (six) AM. Use to check blood sugar 3 times daily 01/22/22  Yes Christian, Rylee, MD  Continuous Glucose Receiver (DEXCOM G7 RECEIVER) DEVI  Use as directed 10/15/22  Yes   Continuous Glucose Sensor (DEXCOM G6 SENSOR) MISC Use to monitor your blood sugar continuously. Apply new sensor every 10 days. 10/28/23  Yes Jackolyn Masker, MD  Continuous Glucose Transmitter (DEXCOM G6 TRANSMITTER) MISC Use to monitor your blood sugar continuously. Apply new transmitter every 90 days. 10/28/23  Yes Jackolyn Masker, MD  glucose blood (ACCU-CHEK GUIDE) test strip Use to check blood sugar 3 times daily when you do not have a Continuous glucose monitoring and when CGM tells you to use your meter 06/01/23  Yes Jackolyn Masker, MD  hydrochlorothiazide  (HYDRODIURIL ) 25 MG tablet Take 1 tablet (25 mg total) by mouth daily. 10/14/23  Yes Arellano Zameza, Priscila, MD  insulin  aspart (NOVOLOG  FLEXPEN) 100 UNIT/ML FlexPen Use up to 23 units three times a day 12/21/23  Yes Jackolyn Masker, MD  insulin  aspart (NOVOLOG ) 100 UNIT/ML injection Use with omnipod to administer up to 90 units daily. 10/14/23  Yes Arellano Zameza, Priscila, MD  insulin  aspart (NOVOLOG ) 100 UNIT/ML injection Inject up to 90 Units into the skin daily. Use with omnipod. 01/18/24  Yes Arellano Zameza, Priscila, MD  Insulin  Disposable Pump (OMNIPOD 5 DEXG7G6 INTRO GEN 5) KIT Use to administer insulin  as instructed. 10/14/23  Yes Arellano Zameza, Priscila, MD  Insulin  Disposable Pump (OMNIPOD 5 DEXG7G6 PODS GEN 5) MISC Use with omnipod to administer up to 90 units daily. 10/14/23  Yes Arellano Zameza, Priscila, MD  Insulin  Pen Needle 32G X 4 MM MISC Use to inject insulin  up to 4 times a day 12/21/23  Yes Jackolyn Masker, MD  lisinopril  (ZESTRIL ) 20 MG tablet Take 2 tablets (40 mg total) by mouth daily. 01/12/24  Yes Arellano Zameza, Priscila, MD  medroxyPROGESTERone  (DEPO-PROVERA ) 150 MG/ML injection Inject 150 mg into the muscle every 3 (three) months.   Yes [provider]  Semaglutide , 2 MG/DOSE, (OZEMPIC , 2 MG/DOSE,) 8 MG/3ML SOPN Inject 2 mg into the skin once a week. 10/28/23  Yes Sheree Dieter, MD   spironolactone  (ALDACTONE ) 25 MG tablet Take 0.5 tablets (12.5 mg total) by mouth daily. 10/14/23  Yes Arellano Zameza, Priscila, MD  Insulin  Pen Needle 32G X 4 MM MISC Use to inject insulin  at the same time each day 01/13/23   Priscella Brooms, DO  ondansetron  (ZOFRAN ) 4 MG tablet Take 1 tablet (4 mg total) by mouth daily as needed for nausea or vomiting. 12/08/23 12/07/24  Sheree Dieter, MD    Family History Family History  Problem Relation Age of Onset   Hypertension Mother    Diabetes Mother    Hypertension Father    Hypertension Sister    Diabetes Sister    Hypertension Maternal Uncle    Kidney disease Maternal Uncle     Social History Social History   Tobacco Use   Smoking status: Never   Smokeless tobacco: Never  Vaping Use   Vaping status: Never Used  Substance Use Topics   Alcohol use: No    Alcohol/week: 0.0 standard drinks of alcohol   Drug use: No     Allergies   Patient has no known allergies.   Review of Systems Review of Systems  Constitutional: Negative.   HENT: Negative.    Respiratory: Negative.    Cardiovascular: Negative.   Gastrointestinal: Negative.   Musculoskeletal:  Positive for joint swelling.     Physical Exam Triage Vital Signs ED Triage Vitals  Encounter Vitals Group     BP 01/19/24 0820 (!) 159/90     Systolic BP Percentile --      Diastolic BP Percentile --      Pulse Rate 01/19/24 0820 72     Resp 01/19/24 0820 16     Temp 01/19/24 0820 98.2 F (36.8 C)     Temp Source 01/19/24 0820 Oral     SpO2 01/19/24 0820 98 %     Weight --      Height --      Head Circumference --      Peak Flow --      Pain Score 01/19/24 0821 4     Pain Loc --      Pain Education --      Exclude from Growth Chart --    No data found.  Updated Vital Signs BP (!) 159/90 (BP Location: Left Arm)   Pulse 72   Temp 98.2 F (36.8 C) (Oral)   Resp 16   SpO2 98%   Visual Acuity Right Eye Distance:   Left Eye Distance:   Bilateral  Distance:    Right Eye Near:   Left Eye Near:    Bilateral Near:  Physical Exam Constitutional:      Appearance: Normal appearance. She is normal weight.  Musculoskeletal:     Comments: Slight swelling to the left 4th PIP joint;  she has TTP to this joint;  no other pain noted;  unable to bend fully;  able to extend  Neurological:     General: No focal deficit present.     Mental Status: She is alert.  Psychiatric:        Mood and Affect: Mood normal.      UC Treatments / Results  Labs (all labs ordered are listed, but only abnormal results are displayed) Labs Reviewed - No data to display  EKG   Radiology DG Finger Ring Left Result Date: 01/19/2024 CLINICAL DATA:  Pulling injury to the fourth digit with pain and swelling, initial encounter EXAM: LEFT RING FINGER 2+V COMPARISON:  None Available. FINDINGS: No acute fracture or dislocation is noted. No soft tissue abnormality is seen. IMPRESSION: No acute abnormality noted. Electronically Signed   By: Violeta Grey M.D.   On: 01/19/2024 09:00    Procedures Procedures (including critical care time)  Medications Ordered in UC Medications - No data to display  Initial Impression / Assessment and Plan / UC Course  I have reviewed the triage vital signs and the nursing notes.  Pertinent labs & imaging results that were available during my care of the patient were reviewed by me and considered in my medical decision making (see chart for details).  Final Clinical Impressions(s) / UC Diagnoses   Final diagnoses:  Pain of finger of left hand     Discharge Instructions      You were seen today for finger pain.  Your xray was read as normal today.  I have given you a splint for support.  I recommend you use ice, and motrin for pain swelling.  If this is not improving over the next several days, you may need to follow up with a hand specialist.  I recommend you call [facility] at [phone] for an appointment.     ED  Prescriptions   None    PDMP not reviewed this encounter.   Lesle Ras, MD 01/19/24 [phone]

## 2024-01-19 NOTE — Discharge Instructions (Signed)
 You were seen today for finger pain.  Your xray was read as normal today.  I have given you a splint for support.  I recommend you use ice, and motrin for pain swelling.  If this is not improving over the next several days, you may need to follow up with a hand specialist.  I recommend you call [facility] at [phone] for an appointment.

## 2024-01-22 NOTE — Progress Notes (Signed)
 Internal Medicine Clinic Attending  Case discussed with the resident at the time of the visit.  We reviewed the resident's history and exam and pertinent patient test results.  I agree with the assessment, diagnosis, and plan of care documented in the resident's note.

## 2024-02-01 ENCOUNTER — Other Ambulatory Visit: Payer: Self-pay

## 2024-02-01 ENCOUNTER — Ambulatory Visit: Payer: Medicaid Other

## 2024-02-01 ENCOUNTER — Other Ambulatory Visit (HOSPITAL_COMMUNITY): Payer: Self-pay

## 2024-02-01 VITALS — BP 135/97 | HR 70 | Ht 67.0 in | Wt 239.2 lb

## 2024-02-01 DIAGNOSIS — Z3042 Encounter for surveillance of injectable contraceptive: Secondary | ICD-10-CM

## 2024-02-01 MED ORDER — MEDROXYPROGESTERONE ACETATE 150 MG/ML IM SUSY
150.0000 mg | PREFILLED_SYRINGE | Freq: Once | INTRAMUSCULAR | Status: AC
Start: 2024-02-01 — End: 2024-02-01
  Administered 2024-02-01: 150 mg via INTRAMUSCULAR

## 2024-02-01 NOTE — Progress Notes (Signed)
 Raven Webb here for Depo-Provera   Injection.  Injection administered without complication. Patient will return in July 28-August 11th, 2025 for next injection. Patient annual exam due in June 2025, pt advised to schedule annual exam at check out. Pt denies any other questions or concerns for today.   Caryle Class, RN 02/01/2024  10:23 AM

## 2024-02-03 ENCOUNTER — Telehealth: Payer: Self-pay | Admitting: Dietician

## 2024-02-03 NOTE — Telephone Encounter (Signed)
 Requesting Dexcom and Omnipod pod samples. Switching to manual mode and limited insulin  delivery. RCT patient.  She tried Restarted her pump but this did not help. Can get pods Friday but die to change hers tomorrow. Has called both Omnipod and Dexcom but will not get their shipments until next week. Sent her troubleshooting tips while I look for samples. ( We are moving out office today and are closed this afternoon and tomorrow and the samples I have are in boxes)

## 2024-02-04 ENCOUNTER — Other Ambulatory Visit (HOSPITAL_COMMUNITY): Payer: Self-pay

## 2024-02-05 ENCOUNTER — Other Ambulatory Visit (HOSPITAL_COMMUNITY): Payer: Self-pay

## 2024-02-09 ENCOUNTER — Other Ambulatory Visit: Payer: Self-pay | Admitting: Dietician

## 2024-02-09 DIAGNOSIS — E118 Type 2 diabetes mellitus with unspecified complications: Secondary | ICD-10-CM

## 2024-02-09 NOTE — Telephone Encounter (Signed)
 Patient would like to switch back to using Dexcom G7 . Request order for sensors.

## 2024-02-10 ENCOUNTER — Other Ambulatory Visit (HOSPITAL_COMMUNITY): Payer: Self-pay

## 2024-02-10 ENCOUNTER — Telehealth: Payer: Self-pay

## 2024-02-10 ENCOUNTER — Other Ambulatory Visit: Payer: Self-pay | Admitting: Student

## 2024-02-10 DIAGNOSIS — E118 Type 2 diabetes mellitus with unspecified complications: Secondary | ICD-10-CM

## 2024-02-10 MED ORDER — DEXCOM G7 SENSOR MISC
3 refills | Status: DC
  Filled 2024-02-10 (×2): qty 3, 30d supply, fill #0

## 2024-02-10 MED ORDER — DEXCOM G7 SENSOR MISC: 3 refills | Status: DC

## 2024-02-10 NOTE — Telephone Encounter (Signed)
 Copied from CRM [id number]. Topic: Clinical - Prescription Issue >> Feb 10, 2024  9:18 AM Lenon Radar A wrote: Reason for CRM: Patient requested refill for Continuous Glucose Sensor (DEXCOM G7 SENSOR) MISC. The prescription was sent to the wrong pharmacy. Pharmacy is on backorder. Please send medication to pharmacy listed below per patients request.   [facility] - [place], [place] - [address] [place] [address] Phone: [phone] Fax: [phone]

## 2024-02-10 NOTE — Telephone Encounter (Signed)
 Thank you [name]

## 2024-02-10 NOTE — Telephone Encounter (Signed)
 Raven Webb (KeyAlwyn Baas) Need Help? Call us  at [phone] Outcome Additional Information Required The member recently filled this medication and will be able to return for their next refill according to their plan limits. Drug Dexcom G7 Sensor ePA cloud logo Form [facility] [place]  Medicaid Electronic PA Form [phone] NCPDP)       I called and spoke to the pharmacy, the pharmacists stated that the patients insurance will not allow her to pick up the Dexcom G7 sensor because she picked up the Dexcom G6 sensor on 01/31/24. Per the pharmacy she will be able to get the Dexcom G7 on 02/23/24. Patient stated she can't wait until 02/23/24 because she has had 2 malfunctions with 2 of the sensors leaving her with only 1 which she is currently using and it will expire this Friday. Per pharmacy she will be able to get the Dexcom G7 with no PA required. Abe Abed are you able to help assist with this?

## 2024-02-10 NOTE — Addendum Note (Signed)
 Addended by: Chaim Gatley on: 02/10/2024 01:29 PM   Modules accepted: Orders

## 2024-02-10 NOTE — Telephone Encounter (Signed)
 I sent her a message that I can give her a sample to get her close if not to 02/23/24. Thank you for letting me know.

## 2024-02-10 NOTE — Telephone Encounter (Signed)
 Pt Raven Webb would like a call back regarding her mediation and prior authorization:  DEXCOM G7 SENSOR) MISC

## 2024-02-10 NOTE — Telephone Encounter (Signed)
 Prior Authorization for patient (Dexcom G7 Sensor) came through on cover my meds was submitted awaiting approval or denial.  WUJ:[id number]

## 2024-02-11 ENCOUNTER — Encounter: Payer: Self-pay | Admitting: Internal Medicine

## 2024-02-11 ENCOUNTER — Ambulatory Visit (INDEPENDENT_AMBULATORY_CARE_PROVIDER_SITE_OTHER): Admitting: Internal Medicine

## 2024-02-11 ENCOUNTER — Other Ambulatory Visit: Payer: Self-pay

## 2024-02-11 ENCOUNTER — Ambulatory Visit (INDEPENDENT_AMBULATORY_CARE_PROVIDER_SITE_OTHER): Admitting: Dietician

## 2024-02-11 VITALS — BP 120/74 | HR 72 | Temp 98.1°F | Ht 67.0 in | Wt 239.6 lb

## 2024-02-11 DIAGNOSIS — E118 Type 2 diabetes mellitus with unspecified complications: Secondary | ICD-10-CM

## 2024-02-11 NOTE — Progress Notes (Unsigned)
 CC: HTN follow up  HPI:  [name] is a 38 y.o. with medical history of T2DM, HTN, GERD presenting to [facility] for HTN follow up. Pt has longstanding hypertension that is not controlled on 3 medications and an ARB was added during last visit. She is presenting for a one month follow up. PRA ratio was normal when checked previously. No sleep study or renal artery duplex done yet.   Please see problem-based list for further details, assessments, and plans.  Past Medical History:  Diagnosis Date   GERD (gastroesophageal reflux disease)    Hypertension    IBS (irritable bowel syndrome)    UTI (lower urinary tract infection)     Current Outpatient Medications (Endocrine & Metabolic):    insulin  aspart (NOVOLOG  FLEXPEN) 100 UNIT/ML FlexPen, Use up to 23 units three times a day   insulin  aspart (NOVOLOG ) 100 UNIT/ML injection, Use with omnipod to administer up to 90 units daily.   insulin  aspart (NOVOLOG ) 100 UNIT/ML injection, Inject up to 90 Units into the skin daily. Use with omnipod.   medroxyPROGESTERone  (DEPO-PROVERA ) 150 MG/ML injection, Inject 150 mg into the muscle every 3 (three) months.   Semaglutide , 2 MG/DOSE, (OZEMPIC , 2 MG/DOSE,) 8 MG/3ML SOPN, Inject 2 mg into the skin once a week.  Current Outpatient Medications (Cardiovascular):    amLODipine  (NORVASC ) 10 MG tablet, Take 1 tablet (10 mg total) by mouth daily.   hydrochlorothiazide  (HYDRODIURIL ) 25 MG tablet, Take 1 tablet (25 mg total) by mouth daily.   lisinopril  (ZESTRIL ) 20 MG tablet, Take 2 tablets (40 mg total) by mouth daily.   spironolactone  (ALDACTONE ) 25 MG tablet, Take 0.5 tablets (12.5 mg total) by mouth daily.  Current Outpatient Medications (Respiratory):    albuterol  (PROVENTIL  HFA) 108 (90 Base) MCG/ACT inhaler, Inhale 1-2 puffs into the lungs every 6 (six) hours as needed for wheezing or shortness of breath.    Current Outpatient Medications (Other):    Accu-Chek Softclix Lancets lancets, USE TO  CHECK BLOOD SUGAR 3 TIMES DAILY   Blood Glucose Monitoring Suppl (ACCU-CHEK GUIDE) w/Device KIT, 1 each by Does not apply route daily at 6 (six) AM. Use to check blood sugar 3 times daily   Continuous Glucose Receiver (DEXCOM G7 RECEIVER) DEVI, Use as directed (Patient not taking: Reported on 02/01/2024)   Continuous Glucose Sensor (DEXCOM G7 SENSOR) MISC, Place new sensor every 10 days. Use to monitor blood sugar continuously.   Continuous Glucose Transmitter (DEXCOM G6 TRANSMITTER) MISC, Use to monitor your blood sugar continuously. Apply new transmitter every 90 days.   glucose blood (ACCU-CHEK GUIDE) test strip, Use to check blood sugar 3 times daily when you do not have a Continuous glucose monitoring and when CGM tells you to use your meter   Insulin  Disposable Pump (OMNIPOD 5 DEXG7G6 INTRO GEN 5) KIT, Use to administer insulin  as instructed.   Insulin  Disposable Pump (OMNIPOD 5 DEXG7G6 PODS GEN 5) MISC, Use with omnipod to administer up to 90 units daily.   Insulin  Pen Needle 32G X 4 MM MISC, Use to inject insulin  at the same time each day   Insulin  Pen Needle 32G X 4 MM MISC, Use to inject insulin  up to 4 times a day   ondansetron  (ZOFRAN ) 4 MG tablet, Take 1 tablet (4 mg total) by mouth daily as needed for nausea or vomiting.  Review of Systems:  Review of system negative unless stated in the problem list or HPI.    Physical Exam:  There were  no vitals filed for this visit. Physical Exam General: NAD HENT: NCAT Lungs: CTAB, no wheeze, rhonchi or rales.  Cardiovascular: Normal heart sounds, no r/m/g, 2+ pulses in all extremities. No LE edema Abdomen: No TTP, normal bowel sounds MSK: No asymmetry or muscle atrophy.  Skin: no lesions noted on exposed skin Neuro: Alert and oriented x4. CN grossly intact Psych: Normal mood and normal affect   Assessment & Plan:   No problem-specific Assessment & Plan notes found for this encounter.   See Encounters Tab for problem based  charting.  Patient Discussed with Dr. {NAMES:[id number]::"Guilloud","Hoffman","Mullen","Narendra","Vincent","Machen","Lau","Hatcher","Williams"} Jackolyn Masker, MD Tommas Fragmin. [facility], PGY-3

## 2024-02-11 NOTE — Progress Notes (Unsigned)
 BP Readings from Last 3 Encounters:  02/11/24 120/74  02/01/24 (!) 135/97  01/19/24 (!) 159/90   Hydrochlorothiazide  and spironlactone Not norvasc  because pharmacy switched size of pill Wt Readings from Last 10 Encounters:  02/11/24 239 lb 9.6 oz (108.7 kg)  02/01/24 239 lb 4 oz (108.5 kg)  01/12/24 236 lb 14.4 oz (107.5 kg)  12/15/23 231 lb 11.2 oz (105.1 kg)  11/16/23 224 lb 8 oz (101.8 kg)  10/14/23 219 lb 12.8 oz (99.7 kg)  09/10/23 214 lb 12.8 oz (97.4 kg)  08/31/23 212 lb 1.6 oz (96.2 kg)  06/22/23 224 lb (101.6 kg)  06/08/23 228 lb 1.6 oz (103.5 kg)   Lab Results  Component Value Date   HGBA1C 7.2 (A) 01/12/2024   HGBA1C 11.6 (A) 10/14/2023   HGBA1C 12.3 (A) 09/10/2023   HGBA1C 7.0 (A) 05/06/2023   HGBA1C 8.7 (A) 01/07/2023   Diabetes Self-Management Education  Visit Type: Annual Follow-Up  Appt. Start Time: 1010 Appt. End Time: 1040  02/17/2024  Raven Webb, identified by name and date of birth, is a 38 y.o. female with a diagnosis of Diabetes:  .   ASSESSMENT  There were no vitals taken for this visit. There is no height or weight on file to calculate BMI.   Diabetes Self-Management Education - 02/16/24 1300       Visit Information   Visit Type Annual Follow-Up      Health Coping   How would you rate your overall health? Good      Psychosocial Assessment   Patient Belief/Attitude about Diabetes Motivated to manage diabetes    What is the hardest part about your diabetes right now, causing you the most concern, or is the most worrisome to you about your diabetes?   Getting support / problem solving    Self-care barriers Lack of material resources    Self-management support Family;Doctor's office;CDE visits    Patient Concerns Support;Problem Solving    Special Needs None    Preferred Learning Style No preference indicated    Learning Readiness Ready    How often do you need to have someone help you when you read instructions, pamphlets, or other  written materials from your doctor or pharmacy? 1 - Never    What is the last grade level you completed in school? 12      Pre-Education Assessment   Patient understands the diabetes disease and treatment process. Comprehends key points    Patient understands incorporating nutritional management into lifestyle. Comprehends key points    Patient undertands incorporating physical activity into lifestyle. Needs Review    Patient understands using medications safely. Needs Review    Patient understands monitoring blood glucose, interpreting and using results Comprehends key points    Patient understands prevention, detection, and treatment of acute complications. Needs Review    Patient understands prevention, detection, and treatment of chronic complications. Needs Review    Patient understands how to develop strategies to address psychosocial issues. Comprehends key points    Patient understands how to develop strategies to promote health/change behavior. Comprehends key points      Complications   Last HgB A1C per patient/outside source 7.2 %    How often do you check your blood sugar? > 4 times/day    Fasting Blood glucose range (mg/dL) 57-846;962-952    Postprandial Blood glucose range (mg/dL) 841-324;401-027;>253    Number of hypoglycemic episodes per month 2    Can you tell when your blood sugar is low?  Yes    What do you do if your blood sugar is low? drinks or eats sweets    Number of hyperglycemic episodes ( >200mg /dL): Weekly    Have you had a dilated eye exam in the past 12 months? Yes    Have you had a dental exam in the past 12 months? Yes    Are you checking your feet? Yes    How many days per week are you checking your feet? 7      Activity / Exercise   Activity / Exercise Type ADL's;Light (walking / raking leaves)    How many days per week do you exercise? 3    How many minutes per day do you exercise? 45    Total minutes per week of exercise 135      Patient Education    Previous Diabetes Education Yes (please comment)   here   Being Active Identified with patient nutritional and/or medication changes necessary with exercise.    Medications Reviewed patients medication for diabetes, action, purpose, timing of dose and side effects.    Chronic complications Dental care;Applicable immunizations      Post-Education Assessment   Patient undertands incorporating physical activity into lifestyle. Comprehends key points    Patient understands using medications safely. Comphrehends key points    Patient understands prevention, detection, and treatment of acute complications. Demonstrates understanding / competency    Patient understands prevention, detection, and treatment of chronic complications. Comprehends key points      Outcomes   Expected Outcomes Demonstrated interest in learning. Expect positive outcomes    Future DMSE 3-4 months    Program Status Completed      Subsequent Visit   Since your last visit have you continued or begun to take your medications as prescribed? Yes    Since your last visit have you had your blood pressure checked? Yes    Is your most recent blood pressure lower, unchanged, or higher since your last visit? Lower    Since your last visit have you experienced any weight changes? No change    Since your last visit, are you checking your blood glucose at least once a day? Yes             Individualized Plan for Diabetes Self-Management Training:   Learning Objective:  Patient will have a greater understanding of diabetes self-management. Patient education plan is to attend individual and/or group sessions per assessed needs and concerns.   Plan:   Patient Instructions  Your A1c will next be done in July  We talked about using a temporary basal to prevent low blood sugar on exercise days( can start by using a 50% decrease for 3-4 hours and adjust that as needed)   The pneumonia vaccine is showing due. It is recommended  once in a lifetime for people with diabetes.   Your blood sugar are looking good! Consider taking more boluses for food as needed to help keep your blood sugar under 200mg /dL  You wiill get an email from [employer] to let you know when the Dexcom G7 integrates with the pump. They should instruct you to update your app  Expected Outcomes:  Demonstrated interest in learning. Expect positive outcomes  Education material provided: Diabetes Resources  If problems or questions, patient to contact team via:  Phone  Future DSME appointment: 3-4 months Marlene Simas, RD 02/17/2024 2:42 PM.

## 2024-02-11 NOTE — Patient Instructions (Addendum)
 Your A1c will next be done in July  We talked about using a temporary basal to prevent low blood sugar on exercise days( can start by using a 50% decrease for 3-4 hours and adjust that as needed)   The pneumonia vaccine is showing due. It is recommended once in a lifetime for people with diabetes.   Your blood sugar are looking good! Consider taking more boluses for food as needed to help keep your blood sugar under 200mg /dL  You wiill get an email from [employer] to let you know when the Dexcom G7 integrates with the pump. They should instruct you to update your app

## 2024-02-22 ENCOUNTER — Other Ambulatory Visit: Payer: Self-pay | Admitting: Dietician

## 2024-02-22 ENCOUNTER — Other Ambulatory Visit (HOSPITAL_COMMUNITY): Payer: Self-pay

## 2024-02-22 DIAGNOSIS — E118 Type 2 diabetes mellitus with unspecified complications: Secondary | ICD-10-CM

## 2024-02-22 NOTE — Progress Notes (Signed)
 Pt left prior to being seen. Rescheduled for 03/10/24.

## 2024-02-22 NOTE — Telephone Encounter (Addendum)
 Patient needs to go back to using Dexcom G6 and needs sensors prescribed. Request is in this note.

## 2024-02-23 ENCOUNTER — Other Ambulatory Visit: Payer: Self-pay | Admitting: Student

## 2024-02-23 ENCOUNTER — Other Ambulatory Visit (HOSPITAL_COMMUNITY): Payer: Self-pay

## 2024-02-23 DIAGNOSIS — E119 Type 2 diabetes mellitus without complications: Secondary | ICD-10-CM

## 2024-02-23 MED ORDER — DEXCOM G6 SENSOR MISC
3 refills | Status: DC
  Filled 2024-02-23: qty 3, 30d supply, fill #0

## 2024-02-24 ENCOUNTER — Telehealth: Payer: Self-pay | Admitting: *Deleted

## 2024-02-24 ENCOUNTER — Other Ambulatory Visit (HOSPITAL_COMMUNITY): Payer: Self-pay

## 2024-02-24 ENCOUNTER — Other Ambulatory Visit: Payer: Self-pay | Admitting: Dietician

## 2024-02-24 DIAGNOSIS — E119 Type 2 diabetes mellitus without complications: Secondary | ICD-10-CM

## 2024-02-24 MED ORDER — OMNIPOD 5 DEXG7G6 PODS GEN 5 MISC
3 refills | Status: AC
  Filled 2024-02-24: qty 45, fill #0
  Filled 2024-03-10: qty 45, 30d supply, fill #0
  Filled 2024-03-10: qty 45, 90d supply, fill #0
  Filled 2024-03-17 – 2024-03-18 (×2): qty 15, 30d supply, fill #0
  Filled 2024-04-04 – 2024-04-09 (×2): qty 15, 30d supply, fill #1
  Filled 2024-05-05: qty 15, 30d supply, fill #2
  Filled 2024-05-30: qty 15, 30d supply, fill #3
  Filled 2024-06-17 – 2024-06-22 (×5): qty 15, 30d supply, fill #4
  Filled 2024-07-19: qty 15, 30d supply, fill #5
  Filled 2024-08-01 – 2024-08-11 (×3): qty 15, 30d supply, fill #6
  Filled 2024-08-29 – 2024-09-02 (×3): qty 15, 30d supply, fill #7
  Filled 2024-09-26: qty 15, 30d supply, fill #8
  Filled 2024-10-11 – 2024-10-19 (×2): qty 15, 30d supply, fill #9
  Filled ????-??-??: fill #7

## 2024-02-24 MED ORDER — OMNIPOD 5 DEXG7G6 INTRO GEN 5 KIT
PACK | 0 refills | Status: AC
  Filled 2024-02-24 – 2024-10-10 (×3): qty 1, fill #0
  Filled ????-??-??: fill #0

## 2024-02-24 NOTE — Telephone Encounter (Signed)
 spoke with patient about her needs for pods and insulin . She states she has plenty of insulin  and pods are falling off too soon. We discussed ways of getting them to adhere better, how to use sample skin adhesive and overpatches. I also informed her that a new prescription per her request has been sent to the pharmacy. She stated that this information was helpful.

## 2024-02-24 NOTE — Telephone Encounter (Signed)
 Patient in the Clinics stating that she is out of her OmniPods.  Needs refill on.Spoke with the Pharmacist who stated that she can put 200 units per day.  Patient stated that her OmniPod does not hold 200 Units.  Pharmacist suggested that patient call her insurance for an override so that she can get another OmniPod. Patient was advised to call her insurance company for an Override to receive an extra OmniPod.  Spoke with D. Plyler who stated that patient can get 200 units in the OmniPod.  Patient was offered an appointment to speak with Abe Abed .  Refused.  Got her samples of the patches and adhesive.

## 2024-02-24 NOTE — Telephone Encounter (Signed)
 Message has been forwarded.  Copied from CRM [id number]. Topic: General - Other >> Feb 24, 2024 10:47 AM Carrielelia G wrote: Reason for CRM: Patient Raven Webb would like a call back from [employer].  She did not disclose the reason, just stated she would know.  Please advise

## 2024-03-03 DIAGNOSIS — S63635A Sprain of interphalangeal joint of left ring finger, initial encounter: Secondary | ICD-10-CM | POA: Diagnosis not present

## 2024-03-10 ENCOUNTER — Ambulatory Visit: Admitting: Student

## 2024-03-10 ENCOUNTER — Other Ambulatory Visit (HOSPITAL_COMMUNITY): Payer: Self-pay

## 2024-03-10 VITALS — BP 144/98 | HR 81 | Temp 99.3°F | Ht 67.0 in | Wt 241.2 lb

## 2024-03-10 DIAGNOSIS — E119 Type 2 diabetes mellitus without complications: Secondary | ICD-10-CM

## 2024-03-10 DIAGNOSIS — Z7985 Long-term (current) use of injectable non-insulin antidiabetic drugs: Secondary | ICD-10-CM | POA: Diagnosis not present

## 2024-03-10 DIAGNOSIS — I10 Essential (primary) hypertension: Secondary | ICD-10-CM | POA: Diagnosis not present

## 2024-03-10 DIAGNOSIS — E118 Type 2 diabetes mellitus with unspecified complications: Secondary | ICD-10-CM

## 2024-03-10 MED ORDER — DEXCOM G7 SENSOR MISC
11 refills | Status: AC
  Filled 2024-03-10 – 2024-03-17 (×5): qty 3, 30d supply, fill #0
  Filled 2024-04-04 – 2024-04-09 (×3): qty 3, 30d supply, fill #1
  Filled 2024-04-29 – 2024-05-03 (×6): qty 3, 30d supply, fill #2
  Filled 2024-05-24 – 2024-05-26 (×3): qty 3, 30d supply, fill #3
  Filled 2024-06-17 – 2024-06-20 (×3): qty 3, 30d supply, fill #4
  Filled 2024-06-24: qty 1, 10d supply, fill #5
  Filled 2024-07-04: qty 1, 10d supply, fill #6
  Filled 2024-07-05: qty 1, 10d supply, fill #7
  Filled 2024-07-13: qty 3, 30d supply, fill #8
  Filled 2024-07-13: qty 1, 10d supply, fill #9
  Filled 2024-08-08 – 2024-08-09 (×2): qty 1, 10d supply, fill #10
  Filled 2024-08-09: qty 3, 30d supply, fill #10
  Filled 2024-08-19: qty 1, 10d supply, fill #11
  Filled 2024-08-19: qty 1, 10d supply, fill #12
  Filled 2024-08-29: qty 1, 10d supply, fill #13
  Filled 2024-08-29 – 2024-09-01 (×2): qty 3, 30d supply, fill #13
  Filled 2024-09-06: qty 1, 10d supply, fill #14
  Filled 2024-09-06: qty 1, 10d supply, fill #15
  Filled 2024-09-16: qty 1, 10d supply, fill #17
  Filled 2024-09-16: qty 1, 10d supply, fill #16
  Filled 2024-09-23: qty 1, 10d supply, fill #18
  Filled 2024-09-23: qty 1, 10d supply, fill #19
  Filled ????-??-??: fill #4

## 2024-03-10 MED ORDER — LISINOPRIL 20 MG PO TABS
20.0000 mg | ORAL_TABLET | Freq: Every day | ORAL | 2 refills | Status: DC
  Filled 2024-03-10: qty 30, 30d supply, fill #0

## 2024-03-10 MED ORDER — LISINOPRIL 20 MG PO TABS
20.0000 mg | ORAL_TABLET | Freq: Every day | ORAL | Status: DC
Start: 2024-03-10 — End: 2024-03-10

## 2024-03-10 NOTE — Assessment & Plan Note (Signed)
 Last A1c on 01/12/2024 was 7.2.  She continues with OmniPod, CGM with Dexcom, and Ozempic  2 mg weekly. - Refill Dexcom G7 CGM - Continue OmniPod insulin  pump and Ozempic  2 mg weekly - Repeat A1c in 6 weeks

## 2024-03-10 NOTE — Patient Instructions (Signed)
 Thank you, Ms.Mykira Laird Pih, for allowing us  to provide your care today. Today we discussed . . .  > Hypertension       - I would like you to resume the lisinopril  at the lower dose of 20 mg daily and if you have any return of the lightheaded symptoms please stop this medication and call us  right away.  Please keep checking your blood pressure at home and if you have time next week bring your blood pressure cuff up to the office to have it checked against our blood pressure cuff to make sure that they are getting similar results.  If this regimen of medication is controlling your blood pressure well and not causing side effects we will try to change to a combination pill at your next visit. > Diabetes       - I have sent in the Dexcom G7 sensors for you to pick up and as we discussed the OmniPod prescription should be corrected now but if they are not filling it correctly please let our office know and we will talk to the pharmacy to make sure that the prescription is written in the correct way.  Follow up: 6 weeks    Remember:  Should you have any questions or concerns please call the internal medicine clinic at [phone].     Cleven Dallas, DO [facility]

## 2024-03-10 NOTE — Progress Notes (Signed)
 CC: Routine Follow Up for hypertension after last office visit 01/12/2024  HPI:  Raven Webb is a 38 y.o. female with pertinent PMH of HTN, T2DM, and obesity who presents as above. Please see assessment and plan below for further details.  Medications: Current Outpatient Medications  Medication Instructions   Accu-Chek Softclix Lancets lancets USE TO CHECK BLOOD SUGAR 3 TIMES DAILY   albuterol  (PROVENTIL  HFA) 108 (90 Base) MCG/ACT inhaler 1-2 puffs, Inhalation, Every 6 hours PRN   amLODipine  (NORVASC ) 10 mg, Oral, Daily   Blood Glucose Monitoring Suppl (ACCU-CHEK GUIDE) w/Device KIT 1 each, Does not apply, Daily, Use to check blood sugar 3 times daily   Continuous Glucose Receiver (DEXCOM G7 RECEIVER) DEVI Use as directed   Continuous Glucose Sensor (DEXCOM G7 SENSOR) MISC Use to monitor blood sugar continuously, placing a new sensor every 10 days.   Continuous Glucose Transmitter (DEXCOM G6 TRANSMITTER) MISC Use to monitor your blood sugar continuously. Apply new transmitter every 90 days.   glucose blood (ACCU-CHEK GUIDE) test strip Use to check blood sugar 3 times daily when you do not have a Continuous glucose monitoring and when CGM tells you to use your meter   hydrochlorothiazide  (HYDRODIURIL ) 25 mg, Oral, Daily   insulin  aspart (NOVOLOG  FLEXPEN) 100 UNIT/ML FlexPen Use up to 23 units three times a day   insulin  aspart (NOVOLOG ) 100 UNIT/ML injection Use with omnipod to administer up to 90 units daily.   insulin  aspart (NOVOLOG ) 100 UNIT/ML injection Inject up to 90 Units into the skin daily. Use with omnipod.   Insulin  Disposable Pump (OMNIPOD 5 DEXG7G6 INTRO GEN 5) KIT Use to administer insulin  as instructed.   Insulin  Disposable Pump (OMNIPOD 5 DEXG7G6 PODS GEN 5) MISC Change pod every two days   Insulin  Pen Needle 32G X 4 MM MISC Use to inject insulin  at the same time each day   Insulin  Pen Needle 32G X 4 MM MISC Use to inject insulin  up to 4 times a day   lisinopril   (ZESTRIL ) 20 mg, Oral, Daily   medroxyPROGESTERone  (DEPO-PROVERA ) 150 mg, Every 3 months   ondansetron  (ZOFRAN ) 4 mg, Oral, Daily PRN   Ozempic  (2 MG/DOSE) 2 mg, Subcutaneous, Weekly   spironolactone  (ALDACTONE ) 12.5 mg, Oral, Daily     Review of Systems:   Pertinent items noted in HPI and/or A&P.  Physical Exam:  Vitals:   03/10/24 0954  BP: (!) 144/98  Pulse: 81  Temp: 99.3 F (37.4 C)  TempSrc: Oral  SpO2: 97%  Weight: 241 lb 3.2 oz (109.4 kg)  Height: 5' 7 (1.702 m)    Constitutional: Well-appearing adult female. In no acute distress. Cardio:Regular rate and rhythm. 2+ bilateral radial pulses. Pulm:Clear to auscultation bilaterally. Normal work of breathing on room air. ZOX:WRUEAVWU for extremity edema. Skin:Warm and dry. Neuro:Alert and oriented x3. No focal deficit noted. Psych:Pleasant mood and affect.   Assessment & Plan:   Hypertension At last visit in April 2025 she was increased from lisinopril  20 mg daily to 40 mg daily on top of continuing amlodipine  10 mg daily, hydrochlorothiazide  25 mg daily, and spironolactone  0.5 mg daily.  After this increase she had several days of orthostatic dizziness but no syncope.  She stopped taking the lisinopril  altogether and has not had any recurrence of her symptoms.  Her blood pressure today in the office was 144/98.  She does have a blood pressure cuff at home and states that the values are predominantly 120s over 80s.  She  did not have any orthostatic symptoms while on lisinopril  20 mg daily in addition to her other medications. - Resume lisinopril  20 mg daily and continue amlodipine  10 mg daily, hydrochlorothiazide  25 mg daily, and spironolactone  12.5 mg daily - Bring in blood pressure cuff to validate against ours next week - Return in 6 weeks and if stable we will look for amlodipine /hydrochlorothiazide /ARB (equivalent dosing to lisinopril ) combination pill  DM (diabetes mellitus), type 2 with complications (HCC) Last  A1c on 01/12/2024 was 7.2.  She continues with OmniPod, CGM with Dexcom, and Ozempic  2 mg weekly. - Refill Dexcom G7 CGM - Continue OmniPod insulin  pump and Ozempic  2 mg weekly - Repeat A1c in 6 weeks    Patient discussed with Dr. Bevelyn Bryant  Raven Dallas, DO [facility] Internal Medicine Resident PGY-2 Clinic Phone: [phone] Please contact the on call pager at [phone] for any urgent or emergent needs.

## 2024-03-10 NOTE — Assessment & Plan Note (Signed)
 At last visit in April 2025 she was increased from lisinopril  20 mg daily to 40 mg daily on top of continuing amlodipine  10 mg daily, hydrochlorothiazide  25 mg daily, and spironolactone  0.5 mg daily.  After this increase she had several days of orthostatic dizziness but no syncope.  She stopped taking the lisinopril  altogether and has not had any recurrence of her symptoms.  Her blood pressure today in the office was 144/98.  She does have a blood pressure cuff at home and states that the values are predominantly 120s over 80s.  She did not have any orthostatic symptoms while on lisinopril  20 mg daily in addition to her other medications. - Resume lisinopril  20 mg daily and continue amlodipine  10 mg daily, hydrochlorothiazide  25 mg daily, and spironolactone  12.5 mg daily - Bring in blood pressure cuff to validate against ours next week - Return in 6 weeks and if stable we will look for amlodipine /hydrochlorothiazide /ARB (equivalent dosing to lisinopril ) combination pill

## 2024-03-14 ENCOUNTER — Other Ambulatory Visit (HOSPITAL_COMMUNITY): Payer: Self-pay

## 2024-03-14 ENCOUNTER — Telehealth: Payer: Self-pay

## 2024-03-14 NOTE — Telephone Encounter (Signed)
 Prior Authorization for patient (Dexcom G7 Sensor) came through on cover my meds was submitted with last office notes and labs awaiting approval or denial.  XZB:[id number]

## 2024-03-14 NOTE — Telephone Encounter (Signed)
Thank you Camille! 

## 2024-03-14 NOTE — Telephone Encounter (Signed)
 Date: 03/14/2024 Request #: [id number] Physician name: Fairy Pool Office fax: [phone] Member name: Raven Webb Member ID: [id number] Member DOB: Dec 14, 1985 Drug requested: OTHELIA PANTHER SENSOR Notes: This is a courtesy notification to advise you that we do not cover the above-requested  medication under this member's benefit plan.   [name] are you able to take a look at this?

## 2024-03-14 NOTE — Progress Notes (Signed)
 Internal Medicine Clinic Attending  Case discussed with the resident at the time of the visit.  We reviewed the resident's history and exam and pertinent patient test results.  I agree with the assessment, diagnosis, and plan of care documented in the resident's note.

## 2024-03-17 ENCOUNTER — Other Ambulatory Visit (HOSPITAL_COMMUNITY): Payer: Self-pay

## 2024-03-17 ENCOUNTER — Other Ambulatory Visit: Payer: Self-pay

## 2024-03-18 ENCOUNTER — Other Ambulatory Visit: Payer: Self-pay | Admitting: Student

## 2024-03-18 ENCOUNTER — Other Ambulatory Visit (HOSPITAL_COMMUNITY): Payer: Self-pay

## 2024-03-18 DIAGNOSIS — E119 Type 2 diabetes mellitus without complications: Secondary | ICD-10-CM

## 2024-03-18 MED ORDER — OZEMPIC (2 MG/DOSE) 8 MG/3ML ~~LOC~~ SOPN
2.0000 mg | PEN_INJECTOR | SUBCUTANEOUS | 2 refills | Status: DC
  Filled 2024-03-18: qty 6, 56d supply, fill #0
  Filled 2024-05-05: qty 6, 56d supply, fill #1
  Filled 2024-07-04: qty 3, 28d supply, fill #2

## 2024-03-18 NOTE — Telephone Encounter (Signed)
 Medication sent to pharmacy

## 2024-04-04 ENCOUNTER — Other Ambulatory Visit (HOSPITAL_COMMUNITY): Payer: Self-pay

## 2024-04-09 ENCOUNTER — Other Ambulatory Visit (HOSPITAL_COMMUNITY): Payer: Self-pay

## 2024-04-18 ENCOUNTER — Other Ambulatory Visit: Payer: Self-pay

## 2024-04-18 ENCOUNTER — Ambulatory Visit

## 2024-04-18 VITALS — BP 129/83 | HR 68 | Wt 239.0 lb

## 2024-04-18 DIAGNOSIS — Z3042 Encounter for surveillance of injectable contraceptive: Secondary | ICD-10-CM | POA: Diagnosis not present

## 2024-04-18 MED ORDER — MEDROXYPROGESTERONE ACETATE 150 MG/ML IM SUSY
150.0000 mg | PREFILLED_SYRINGE | Freq: Once | INTRAMUSCULAR | Status: AC
  Administered 2024-04-18: 150 mg via INTRAMUSCULAR

## 2024-04-18 NOTE — Progress Notes (Signed)
 Raven Webb here for Depo-Provera  Injection. Injection administered without complication. Patient will return in 3 months for next injection between 07/04/24 and 07/18/24. Next annual visit due 08/30/24.     [name], RN

## 2024-04-29 ENCOUNTER — Encounter (HOSPITAL_COMMUNITY): Payer: Self-pay

## 2024-04-29 ENCOUNTER — Other Ambulatory Visit (HOSPITAL_COMMUNITY): Payer: Self-pay

## 2024-04-29 ENCOUNTER — Telehealth: Payer: Self-pay

## 2024-04-29 DIAGNOSIS — E119 Type 2 diabetes mellitus without complications: Secondary | ICD-10-CM

## 2024-04-29 NOTE — Telephone Encounter (Signed)
 Copied from CRM [id number]. Topic: Referral - Prior Authorization Question >> Apr 29, 2024  1:32 PM Suzette B wrote: Reason for CRM: Ms. Adrianne Pharmacy Tech at [facility] health pharmacy has called in regards to a PA for the Dexcom. She stated the patient had maybe 2 days work of medication left, and also wanted to know if the clinic used CoverMy Meds to fill the prescriptions I advised Ms Adrianne the clinic was closed but would send over a message to have someone call her back in regards to the [place] [phone]

## 2024-04-29 NOTE — Telephone Encounter (Signed)
 Message sent to wrong office, forwarding back to [facility] team

## 2024-05-02 ENCOUNTER — Other Ambulatory Visit (HOSPITAL_COMMUNITY): Payer: Self-pay

## 2024-05-02 ENCOUNTER — Telehealth: Payer: Self-pay

## 2024-05-02 MED ORDER — ACCU-CHEK GUIDE W/DEVICE KIT
PACK | 1 refills | Status: AC
  Filled 2024-05-02: qty 1, 30d supply, fill #0

## 2024-05-02 MED ORDER — ACCU-CHEK GUIDE TEST VI STRP
ORAL_STRIP | 5 refills | Status: AC
  Filled 2024-05-02: qty 100, 33d supply, fill #0
  Filled 2024-06-17: qty 100, 33d supply, fill #1
  Filled 2024-08-16: qty 100, 33d supply, fill #2

## 2024-05-02 MED ORDER — ACCU-CHEK SOFTCLIX LANCETS MISC
3 refills | Status: AC
  Filled 2024-05-02: qty 100, 33d supply, fill #0
  Filled 2024-06-17: qty 100, 33d supply, fill #1

## 2024-05-02 NOTE — Telephone Encounter (Signed)
 Raven Webb calls saying she is unable to refill Gexcom G7 sensors at this time until PA done. She has been out of sensors and does not have a meter to check her sugar.  Doe snot use G6 anymore; Remove transmitter for Dexcom G6. Uses Dexcom G7 and accu check meter and supplies sent to [facility] pharmacy is fine.

## 2024-05-02 NOTE — Telephone Encounter (Signed)
 Copied from CRM [id number]. Topic: Referral - Prior Authorization Question >> Apr 29, 2024  1:32 PM Suzette B wrote: Reason for CRM: Ms. Adrianne Pharmacy Tech at [facility] health pharmacy has called in regards to a PA for the Dexcom. She stated the patient had maybe 2 days work of medication left, and also wanted to know if the clinic used CoverMy Meds to fill the prescriptions I advised Ms Adrianne the clinic was closed but would send over a message to have someone call her back in regards to the [place] [phone] >> Apr 29, 2024  2:46 PM Farrel B wrote: Reason for CRM: Ms. Adrianne Pharmacy Tech at [facility] health pharmacy has called in regards to a PA for the Dexcom. She stated the patient had maybe 2 days work of medication left, and also wanted to know if the clinic used CoverMy Meds to fill the prescriptions I advised Ms Adrianne the clinic was closed but would send over a message to have someone call her back in regards to the [place] [phone]

## 2024-05-02 NOTE — Telephone Encounter (Signed)
 Prior Authorization for patient (Dexcom G7 Sensor) came through on cover my meds was submitted with last office notes and labs awaiting approval or denial.  XZB:[id number]

## 2024-05-02 NOTE — Telephone Encounter (Signed)
 Pharmacy Patient Advocate Encounter   Received notification from CoverMyMeds that prior authorization for [facility] G7 SENSOR is required/requested.   Insurance verification completed.   The patient is insured through [facility] .   Per test claim: PA required; PA submitted to above mentioned insurance via Latent Key/confirmation #/EOC [id number] Status is pending

## 2024-05-02 NOTE — Telephone Encounter (Signed)
 Date: 05/02/2024 Request #: [id number] Physician name: Fairy Pool Office fax: [phone] Member name: Raven Webb Member ID: [id number] Member DOB: 09/01/1986 Drug requested: OTHELIA PANTHER SENSOR Notes: This is a courtesy notification to advise you that we do not cover the above-requested  medication under this member's benefit plan.  Please note, a copy of the denial letter that we sent to the member containing the appeal  and/or grievance process will be mailed to your office. The member was sent a Notice of  Action regarding this denial.  Raven Webb are you able to help assist with the denial? She was approved in the past.

## 2024-05-03 ENCOUNTER — Encounter (HOSPITAL_COMMUNITY): Payer: Self-pay

## 2024-05-03 ENCOUNTER — Other Ambulatory Visit (HOSPITAL_COMMUNITY): Payer: Self-pay

## 2024-05-03 NOTE — Telephone Encounter (Signed)
 Thank you [name]!  Patient is aware of the approval.

## 2024-05-03 NOTE — Telephone Encounter (Signed)
 Pharmacy Patient Advocate Encounter  Received notification from [facility] that Prior Authorization for [facility] G7 SENSORS has been APPROVED from 05/03/24 to 05/03/25   PA #/Case ID/Reference #: [id number]

## 2024-05-05 ENCOUNTER — Other Ambulatory Visit (HOSPITAL_COMMUNITY): Payer: Self-pay

## 2024-05-19 ENCOUNTER — Telehealth: Payer: Self-pay | Admitting: Dietician

## 2024-05-19 NOTE — Telephone Encounter (Signed)
 Call to patient per Dr. Rebbeca approval of following pump changes:  1- Duration of insulin  action is 4, change it to 3  2- BG Correction Threshold is 12am- 8 am 150 and 8am to 12 am 140. Change to 12 am to 8 am to 120 mg/dL and 8 am to 12 am to 889 mg/dL.  Patient made the above changes while we were on the phone and verbalized understanding to what they will change. Will confirm on [place] that she has made these changes.

## 2024-05-24 ENCOUNTER — Other Ambulatory Visit (HOSPITAL_COMMUNITY): Payer: Self-pay

## 2024-05-26 ENCOUNTER — Other Ambulatory Visit: Payer: Self-pay

## 2024-05-26 ENCOUNTER — Other Ambulatory Visit (HOSPITAL_COMMUNITY): Payer: Self-pay

## 2024-05-26 ENCOUNTER — Encounter (HOSPITAL_COMMUNITY): Payer: Self-pay

## 2024-05-30 ENCOUNTER — Other Ambulatory Visit (HOSPITAL_COMMUNITY): Payer: Self-pay

## 2024-06-01 ENCOUNTER — Other Ambulatory Visit: Payer: Self-pay | Admitting: Student

## 2024-06-01 ENCOUNTER — Other Ambulatory Visit (HOSPITAL_COMMUNITY): Payer: Self-pay

## 2024-06-01 ENCOUNTER — Encounter (HOSPITAL_COMMUNITY): Payer: Self-pay

## 2024-06-01 DIAGNOSIS — E66811 Obesity, class 1: Secondary | ICD-10-CM

## 2024-06-01 DIAGNOSIS — E118 Type 2 diabetes mellitus with unspecified complications: Secondary | ICD-10-CM

## 2024-06-01 MED ORDER — TIRZEPATIDE 5 MG/0.5ML ~~LOC~~ SOAJ
5.0000 mg | SUBCUTANEOUS | 1 refills | Status: DC
  Filled 2024-06-01 – 2024-06-02 (×4): qty 2, 28d supply, fill #0
  Filled 2024-06-20 – 2024-06-23 (×5): qty 2, 28d supply, fill #1
  Filled ????-??-??: fill #1

## 2024-06-01 NOTE — Progress Notes (Signed)
 Due to lack of weight loss patient is requesting a change from semaglutide  subcutaneous injections 2 mg weekly to tirzepatide  subcutanous injections. 5 mg weekly of tirzepatide  would be a comparable dose. I have written this prescription and we will follow up the need for prior authorization.

## 2024-06-02 ENCOUNTER — Telehealth: Payer: Self-pay

## 2024-06-02 ENCOUNTER — Other Ambulatory Visit (HOSPITAL_COMMUNITY): Payer: Self-pay

## 2024-06-02 NOTE — Telephone Encounter (Signed)
 Vonn Roys (Key: [id number]) Mounjaro  5MG /0.5ML auto-injectors Form [employer] [place]  Medicaid Electronic PA Form (2017 NCPDP) Created 22 hours ago Sent to Plan 3 minutes ago Plan Response 3 minutes ago Submit Clinical Questions 1 minute ago Determination Favorable 1 minute ago Message from Plan PA Case: [id number], Status: Approved, Coverage Starts on: 06/02/2024 12:00:00 AM, Coverage Ends on: 06/02/2025 12:00:00 AM.. Authorization Expiration Date: June 02, 2025.

## 2024-06-02 NOTE — Telephone Encounter (Signed)
 Prior Authorization for patient (Mounjaro  5MG /0.5ML auto-injectors) came through on cover my meds was submitted with last office notes and labs awaiting approval or denial.  XZB:[id number]

## 2024-06-03 NOTE — Telephone Encounter (Signed)
 A user error has taken place: encounter opened in error, closed for administrative reasons.

## 2024-06-06 ENCOUNTER — Ambulatory Visit: Admitting: Dietician

## 2024-06-06 DIAGNOSIS — E118 Type 2 diabetes mellitus with unspecified complications: Secondary | ICD-10-CM

## 2024-06-06 LAB — POCT GLYCOSYLATED HEMOGLOBIN (HGB A1C): HbA1c, POC (controlled diabetic range): 6.6 % (ref 0.0–7.0)

## 2024-06-06 LAB — GLUCOSE, CAPILLARY: Glucose-Capillary: 163 mg/dL — ABNORMAL HIGH (ref 70–99)

## 2024-06-06 NOTE — Progress Notes (Signed)
 Diabetes Self-Management Education  Visit Type: Annual Follow-Up  Appt. Start Time: 215 Appt. End Time: 245  06/06/2024  [name], identified by name and date of birth, is a 38 y.o. female with a diagnosis of Diabetes:  SABRA Type 2  ASSESSMENT  Lab Results  Component Value Date   HGBA1C 6.6 06/06/2024   HGBA1C 7.2 (A) 01/12/2024   HGBA1C 11.6 (A) 10/14/2023   HGBA1C 12.3 (A) 09/10/2023   HGBA1C 7.0 (A) 05/06/2023    Her A1c has decreased from 11.6% prior to insulin  pump initiation/use to 6.6% today. Her weight increased 7# from time when her A1c was 7% ( on 05/06/23) and back to the baseline weight she was on 10/02/22 . Agree with Mounjaro  treatment.   Wt Readings from Last 20 Encounters:  04/18/24 239 lb (108.4 kg)  03/10/24 241 lb 3.2 oz (109.4 kg)  02/11/24 239 lb 9.6 oz (108.7 kg)  02/01/24 239 lb 4 oz (108.5 kg)  01/12/24 236 lb 14.4 oz (107.5 kg)  12/15/23 231 lb 11.2 oz (105.1 kg)  11/16/23 224 lb 8 oz (101.8 kg)  10/14/23 219 lb 12.8 oz (99.7 kg)  09/10/23 214 lb 12.8 oz (97.4 kg)  08/31/23 212 lb 1.6 oz (96.2 kg)  06/22/23 224 lb (101.6 kg)  06/08/23 228 lb 1.6 oz (103.5 kg)  05/06/23 232 lb 9.6 oz (105.5 kg)  03/06/23 226 lb 8 oz (102.7 kg)  02/26/23 228 lb 8 oz (103.6 kg)  01/19/23 228 lb 8 oz (103.6 kg)  01/07/23 228 lb 9.6 oz (103.7 kg)  12/18/22 232 lb 11.2 oz (105.6 kg)  10/15/22 237 lb 4.8 oz (107.6 kg)  10/02/22 239 lb 3.2 oz (108.5 kg)      Diabetes Self-Management Education - 06/06/24 1600       Visit Information   Visit Type Annual Follow-Up      Health Coping   How would you rate your overall health? Good      Psychosocial Assessment   Patient Belief/Attitude about Diabetes Motivated to manage diabetes    What is the hardest part about your diabetes right now, causing you the most concern, or is the most worrisome to you about your diabetes?   Getting support / problem solving    Self-care barriers Lack of material resources     Self-management support Family    Other persons present Family Member    Patient Concerns Support;Glycemic Control    Special Needs None    Preferred Learning Style No preference indicated    Learning Readiness Ready    How often do you need to have someone help you when you read instructions, pamphlets, or other written materials from your doctor or pharmacy? 2 - Rarely    What is the last grade level you completed in school? 12      Pre-Education Assessment   Patient understands using medications safely. Needs Review    Patient understands monitoring blood glucose, interpreting and using results Needs Review      Complications   Last HgB A1C per patient/outside source 6.6 %    How often do you check your blood sugar? > 4 times/day    Fasting Blood glucose range (mg/dL) 29-870    Postprandial Blood glucose range (mg/dL) 29-870;869-820    Number of hypoglycemic episodes per month 1   she had symptoms at a blood sugar of 88   Can you tell when your blood sugar is low? Yes    What do you do  if your blood sugar is low? eats or drinks something sweet    Number of hyperglycemic episodes ( >200mg /dL): Occasional    Can you tell when your blood sugar is high? Yes    What do you do if your blood sugar is high? takes correction insulin     Have you had a dilated eye exam in the past 12 months? Yes    Have you had a dental exam in the past 12 months? Yes    Are you checking your feet? Yes    How many days per week are you checking your feet? 7      Dietary Intake   Breakfast deferred      Activity / Exercise   Activity / Exercise Type --   deferred     Patient Education   Previous Diabetes Education Yes   here   Medications Reviewed medication adjustment guidelines for hyperglycemia and sick days.;Other (comment)   assisted her with putting her pump settings in her insulin  pump. she plans to start Mounjaro  in a month   Monitoring Interpreting lab values - A1C, lipid, urine microalbumina.       Individualized Goals (developed by patient)   Medications take my medication as prescribed      Patient Self-Evaluation of Goals - Patient rates self as meeting previously set goals (% of time)   Medications >75% (most of the time)      Post-Education Assessment   Patient understands using medications safely. Comphrehends key points    Patient understands monitoring blood glucose, interpreting and using results Demonstrates understanding / competency      Outcomes   Expected Outcomes Demonstrated interest in learning. Expect positive outcomes    Future DMSE 3-4 months    Program Status Completed      Subsequent Visit   Since your last visit have you continued or begun to take your medications as prescribed? Yes    Since your last visit have you had your blood pressure checked? No    Since your last visit have you experienced any weight changes? No change    Since your last visit, are you checking your blood glucose at least once a day? Yes          Individualized Plan for Diabetes Self-Management Training:   Learning Objective:  Patient will have a greater understanding of diabetes self-management. Patient education plan is to attend individual and/or group sessions per assessed needs and concerns.   Plan:   There are no Patient Instructions on file for this visit.  Expected Outcomes:  Demonstrated interest in learning. Expect positive outcomes  Education material provided: Diabetes Resources  If problems or questions, patient to contact team via:  Phone and Email  Future DSME appointment: 3-4 months Arland Hole, RD 06/06/2024 4:21 PM.

## 2024-06-06 NOTE — Telephone Encounter (Signed)
 Hi Orit,  I hope it reinstalls your settings, but if it doesn't, I can help you at 215 PM or later this afternoon. We can do it in person or if you feel comfortable over the phone.  Arland

## 2024-06-13 ENCOUNTER — Telehealth: Payer: Self-pay | Admitting: Dietician

## 2024-06-13 NOTE — Telephone Encounter (Signed)
 I spoke to patient on the phone

## 2024-06-13 NOTE — Telephone Encounter (Signed)
 Spoke to [facility] and she will have to replace the sensor. She states the cannula did not release correctly

## 2024-06-14 ENCOUNTER — Encounter: Admitting: Student

## 2024-06-17 ENCOUNTER — Other Ambulatory Visit: Payer: Self-pay

## 2024-06-17 ENCOUNTER — Other Ambulatory Visit: Payer: Self-pay | Admitting: Student

## 2024-06-17 ENCOUNTER — Other Ambulatory Visit (HOSPITAL_COMMUNITY): Payer: Self-pay

## 2024-06-17 MED ORDER — INSULIN ASPART 100 UNIT/ML IJ SOLN
90.0000 [IU] | Freq: Every day | INTRAMUSCULAR | 12 refills | Status: AC
  Filled 2024-06-17: qty 30, 33d supply, fill #0
  Filled 2024-08-08: qty 10, 11d supply, fill #1
  Filled 2024-08-23: qty 10, 11d supply, fill #2
  Filled 2024-09-26: qty 10, 11d supply, fill #3
  Filled 2024-10-10: qty 10, 11d supply, fill #4

## 2024-06-20 ENCOUNTER — Other Ambulatory Visit: Payer: Self-pay

## 2024-06-20 ENCOUNTER — Other Ambulatory Visit (HOSPITAL_COMMUNITY): Payer: Self-pay

## 2024-06-21 ENCOUNTER — Other Ambulatory Visit (HOSPITAL_COMMUNITY): Payer: Self-pay

## 2024-06-21 ENCOUNTER — Encounter (HOSPITAL_COMMUNITY): Payer: Self-pay

## 2024-06-21 NOTE — Telephone Encounter (Signed)
 Copied from CRM [id number]. Topic: Clinical - Prescription Issue >> Jun 21, 2024 10:35 AM Farrel B wrote: Reason for CRM: [id number] patient states she having some issues with her Mounjaro , patient stated she was told she needed a PA asap to avoid delay please call patient to advise

## 2024-06-21 NOTE — Telephone Encounter (Signed)
 I thought since she is on Mounjaro  and not Zepbound  for her diabetes and blood glucose lowering not weight loss that the Zepbound  rules would not apply to her and this situation.

## 2024-06-22 ENCOUNTER — Other Ambulatory Visit (HOSPITAL_COMMUNITY): Payer: Self-pay

## 2024-06-22 ENCOUNTER — Other Ambulatory Visit: Payer: Self-pay

## 2024-06-23 ENCOUNTER — Other Ambulatory Visit: Payer: Self-pay

## 2024-06-23 ENCOUNTER — Other Ambulatory Visit (HOSPITAL_COMMUNITY): Payer: Self-pay

## 2024-06-24 ENCOUNTER — Other Ambulatory Visit (HOSPITAL_COMMUNITY): Payer: Self-pay

## 2024-07-03 ENCOUNTER — Other Ambulatory Visit: Payer: Self-pay

## 2024-07-04 ENCOUNTER — Other Ambulatory Visit (HOSPITAL_COMMUNITY): Payer: Self-pay

## 2024-07-04 ENCOUNTER — Encounter: Payer: Self-pay | Admitting: Student

## 2024-07-04 ENCOUNTER — Other Ambulatory Visit: Payer: Self-pay

## 2024-07-04 ENCOUNTER — Other Ambulatory Visit: Payer: Self-pay | Admitting: Student

## 2024-07-04 ENCOUNTER — Telehealth (HOSPITAL_COMMUNITY): Payer: Self-pay | Admitting: Pharmacy Technician

## 2024-07-04 ENCOUNTER — Ambulatory Visit: Admitting: *Deleted

## 2024-07-04 VITALS — BP 145/99 | HR 107 | Ht 67.0 in | Wt 234.9 lb

## 2024-07-04 DIAGNOSIS — Z3042 Encounter for surveillance of injectable contraceptive: Secondary | ICD-10-CM

## 2024-07-04 DIAGNOSIS — E119 Type 2 diabetes mellitus without complications: Secondary | ICD-10-CM

## 2024-07-04 MED ORDER — FREESTYLE LIBRE 3 PLUS SENSOR MISC
11 refills | Status: DC
  Filled 2024-07-04: qty 2, 30d supply, fill #0
  Filled 2024-07-07 – 2024-07-12 (×2): qty 1, 15d supply, fill #0
  Filled 2024-07-13: qty 2, 30d supply, fill #0
  Filled 2024-07-14: qty 1, 15d supply, fill #0
  Filled ????-??-??: fill #0

## 2024-07-04 MED ORDER — OZEMPIC (2 MG/DOSE) 8 MG/3ML ~~LOC~~ SOPN
2.0000 mg | PEN_INJECTOR | SUBCUTANEOUS | 2 refills | Status: DC
  Filled 2024-07-04: qty 3, 28d supply, fill #0

## 2024-07-04 MED ORDER — MEDROXYPROGESTERONE ACETATE 150 MG/ML IM SUSY
150.0000 mg | PREFILLED_SYRINGE | Freq: Once | INTRAMUSCULAR | Status: AC
  Administered 2024-07-04: 150 mg via INTRAMUSCULAR

## 2024-07-04 NOTE — Telephone Encounter (Signed)
 This drug has been filed already, no PA needed at this time.

## 2024-07-04 NOTE — Addendum Note (Signed)
 Addended by: Saraya Tirey on: 07/04/2024 06:08 PM   Modules accepted: Orders

## 2024-07-04 NOTE — Progress Notes (Signed)
 Raven Webb here for Depo-Provera  Injection. Injection administered without complication. Patient will return in 3 months for next injection between 09/19/24 and 10/03/24. Next annual visit due after 08/30/24.  [facility] 07/04/2024  9:09 AM

## 2024-07-04 NOTE — Progress Notes (Signed)
 Notified by pharmacy that the patient would like to switch back from tirzepatide  to semaglutide  so I have sent in 2 mg semaglutide  weekly prescription.

## 2024-07-05 ENCOUNTER — Other Ambulatory Visit (HOSPITAL_COMMUNITY): Payer: Self-pay

## 2024-07-05 ENCOUNTER — Telehealth (HOSPITAL_COMMUNITY): Payer: Self-pay

## 2024-07-06 ENCOUNTER — Other Ambulatory Visit: Payer: Self-pay

## 2024-07-06 ENCOUNTER — Other Ambulatory Visit (HOSPITAL_COMMUNITY): Payer: Self-pay

## 2024-07-07 ENCOUNTER — Other Ambulatory Visit (HOSPITAL_COMMUNITY): Payer: Self-pay

## 2024-07-07 ENCOUNTER — Telehealth (HOSPITAL_COMMUNITY): Payer: Self-pay | Admitting: Pharmacy Technician

## 2024-07-07 NOTE — Telephone Encounter (Signed)
 Pharmacy Patient Advocate Encounter  Received notification from [employer] that Prior Authorization for [facility] 3 plus sensors have been APPROVED from 07/07/24 to 01/03/25   PA #/Case ID/Reference #: [id number]

## 2024-07-07 NOTE — Telephone Encounter (Signed)
 Pharmacy Patient Advocate Encounter   Received notification from [facility] Calls Messages/[facility] that prior authorization for [facility] 3 plus sensors is required/requested.   Insurance verification completed.   The patient is insured through [employer].   Per test claim: PA required; PA submitted to above mentioned insurance via Latent Key/confirmation #/EOC [employer] 3 plus sensors Status is pending

## 2024-07-07 NOTE — Telephone Encounter (Signed)
 PA request has been Received. New Encounter has been or will be created for follow up. For additional info see Pharmacy Prior Auth telephone encounter from 07/07/24.

## 2024-07-11 ENCOUNTER — Other Ambulatory Visit (HOSPITAL_COMMUNITY): Payer: Self-pay

## 2024-07-12 ENCOUNTER — Other Ambulatory Visit: Payer: Self-pay

## 2024-07-13 ENCOUNTER — Other Ambulatory Visit: Payer: Self-pay

## 2024-07-13 ENCOUNTER — Other Ambulatory Visit (HOSPITAL_COMMUNITY): Payer: Self-pay

## 2024-07-14 ENCOUNTER — Other Ambulatory Visit (HOSPITAL_COMMUNITY): Payer: Self-pay

## 2024-07-19 ENCOUNTER — Other Ambulatory Visit (HOSPITAL_COMMUNITY): Payer: Self-pay

## 2024-07-19 ENCOUNTER — Other Ambulatory Visit: Payer: Self-pay | Admitting: Student

## 2024-07-19 DIAGNOSIS — I10 Essential (primary) hypertension: Secondary | ICD-10-CM

## 2024-07-19 MED ORDER — AMLODIPINE BESYLATE 10 MG PO TABS
10.0000 mg | ORAL_TABLET | Freq: Every day | ORAL | 0 refills | Status: DC
  Filled 2024-07-19: qty 90, 90d supply, fill #0

## 2024-07-29 ENCOUNTER — Other Ambulatory Visit: Payer: Self-pay

## 2024-07-29 ENCOUNTER — Ambulatory Visit: Admitting: Student

## 2024-07-29 VITALS — BP 134/94 | HR 68 | Temp 98.2°F | Ht 67.0 in | Wt 231.4 lb

## 2024-07-29 DIAGNOSIS — I10 Essential (primary) hypertension: Secondary | ICD-10-CM | POA: Diagnosis not present

## 2024-07-29 DIAGNOSIS — M222X2 Patellofemoral disorders, left knee: Secondary | ICD-10-CM | POA: Diagnosis not present

## 2024-07-29 DIAGNOSIS — E118 Type 2 diabetes mellitus with unspecified complications: Secondary | ICD-10-CM | POA: Diagnosis not present

## 2024-07-29 DIAGNOSIS — R5383 Other fatigue: Secondary | ICD-10-CM

## 2024-07-29 MED ORDER — LOSARTAN POTASSIUM 25 MG PO TABS
25.0000 mg | ORAL_TABLET | Freq: Every day | ORAL | 3 refills | Status: AC
  Filled 2024-07-29: qty 30, 30d supply, fill #0
  Filled 2024-10-10: qty 30, 30d supply, fill #1

## 2024-07-29 NOTE — Assessment & Plan Note (Signed)
 Last A1c on 06/06/2024 was 6.6 which is down from 7.2 on 01/12/2024.  Urine microalbumin within normal limits on 12/15/2023.  Not due for foot exam or eye exam.  She continues on insulin  therapy with OmniPod, continuous glucose monitoring with Dexcom G7, and on GLP-1 therapy with Ozempic .  She was reporting 4-5 episodes of low blood sugar a week although her lowest blood sugar has been 69.  She states she feels some symptoms of low blood sugar when her blood glucose goes below 110.  Further discussion reveals that this is mostly tied to her inputting more carbohydrates than she actually eats before meals and she will work on doing this more accurately.  For her fatigue we will also check a TSH and CBC to ensure no occult anemia or thyroid abnormality. - Continue with OmniPod insulin  pump and Ozempic  2 mg weekly - Continue CGM with Dexcom G7 - Return in 4 weeks to see diabetes coordinator and in 6-8 weeks for provider visit

## 2024-07-29 NOTE — Assessment & Plan Note (Signed)
 Blood pressure today 138/90.  At her last visit on 03/10/2024 she was to resume lisinopril  20 mg daily and continue amlodipine  10 mg daily, hydrochlorothiazide  25 mg daily, and spironolactone  12.5 mg daily.  Blood pressures at home have been similar to blood pressure in the office today.  She has not been taking the lisinopril  due to it causing her blood sugar to go up.  She is agreeable to try an ARB.  Last available BMP is from 08/2023 with normal electrolytes and kidney function.  We will repeat a BMP today. - Start losartan 25 mg daily and continue amlodipine  10 mg daily, hydrochlorothiazide  25 mg daily, and spironolactone  12.5 mg daily - BMP today, if any abnormalities such as borderline hyperkalemia or worsening kidney function we will change this medication regimen

## 2024-07-29 NOTE — Progress Notes (Signed)
 CC: Routine Follow Up for management of chronic medical conditions after last office visit 03/10/2024  HPI:  Raven Webb is a 38 y.o. female with pertinent PMH of HTN, T2DM, and obesity  who presents as above. Please see assessment and plan below for further details.  Medications: Current Outpatient Medications  Medication Instructions   Accu-Chek Softclix Lancets lancets Use to check blood sugar 3 times daily when you do not have a Continuous glucose monitoring and when CGM tells you to use your meter   albuterol  (PROVENTIL  HFA) 108 (90 Base) MCG/ACT inhaler 1-2 puffs, Inhalation, Every 6 hours PRN   amLODipine  (NORVASC ) 10 mg, Oral, Daily   Blood Glucose Monitoring Suppl (ACCU-CHEK GUIDE) w/Device KIT Use to check blood sugar 3 times daily   Continuous Glucose Receiver (DEXCOM G7 RECEIVER) DEVI Use as directed   Continuous Glucose Sensor (DEXCOM G7 SENSOR) MISC Use to monitor blood sugar continuously, placing a new sensor every 10 days.   Continuous Glucose Sensor (FREESTYLE LIBRE 3 PLUS SENSOR) MISC Change sensor every 15 days.   Continuous Glucose Transmitter (DEXCOM G6 TRANSMITTER) MISC Use to monitor your blood sugar continuously. Apply new transmitter every 90 days.   glucose blood (ACCU-CHEK GUIDE TEST) test strip Use to check blood sugar 3 times daily when you do not have a Continuous glucose monitoring and when CGM tells you to use your meter   hydrochlorothiazide  (HYDRODIURIL ) 25 mg, Oral, Daily   insulin  aspart (NOVOLOG  FLEXPEN) 100 UNIT/ML FlexPen Use up to 23 units three times a day   insulin  aspart (NOVOLOG ) 100 UNIT/ML injection Use with omnipod to administer up to 90 units daily.   insulin  aspart (NOVOLOG ) 100 UNIT/ML injection Inject up to 90 Units into the skin daily. Use with omnipod.   Insulin  Disposable Pump (OMNIPOD 5 DEXG7G6 INTRO GEN 5) KIT Use to administer insulin  as instructed.   Insulin  Disposable Pump (OMNIPOD 5 DEXG7G6 PODS GEN 5) MISC Change pod every two  days   Insulin  Pen Needle 32G X 4 MM MISC Use to inject insulin  at the same time each day   Insulin  Pen Needle 32G X 4 MM MISC Use to inject insulin  up to 4 times a day   losartan (COZAAR) 25 mg, Oral, Daily   medroxyPROGESTERone  (DEPO-PROVERA ) 150 mg, Every 3 months   ondansetron  (ZOFRAN ) 4 mg, Oral, Daily PRN   Ozempic  (2 MG/DOSE) 2 mg, Subcutaneous, Weekly   spironolactone  (ALDACTONE ) 12.5 mg, Oral, Daily     Review of Systems:   Pertinent items noted in HPI and/or A&P.  Physical Exam:  Vitals:   07/29/24 0935 07/29/24 1003  BP: (!) 138/90 (!) 134/94  Pulse: 80 68  Temp: 98.2 F (36.8 C)   TempSrc: Oral   SpO2: 99%   Weight: 231 lb 6.4 oz (105 kg)   Height: 5' 7 (1.702 m)     Constitutional: Well-appearing adult female. In no acute distress. HEENT: Normocephalic, atraumatic, Sclera non-icteric, PERRL, EOM intact Cardio:Regular rate and rhythm. 2+ bilateral radial pulses. Pulm: Normal work of breathing on room air. MSK: Tenderness with left patellar glide and tenderness to palpation of the left patellar tendon.  Bilateral knees without edema, joint effusion, joint line tenderness, decreased range of motion, or pain with patellar grind. Skin:Warm and dry. Neuro:Alert and oriented x3. No focal deficit noted. Psych:Pleasant mood and affect.   Assessment & Plan:   Assessment & Plan Primary hypertension Blood pressure today 138/90.  At her last visit on 03/10/2024 she was to  resume lisinopril  20 mg daily and continue amlodipine  10 mg daily, hydrochlorothiazide  25 mg daily, and spironolactone  12.5 mg daily.  Blood pressures at home have been similar to blood pressure in the office today.  She has not been taking the lisinopril  due to it causing her blood sugar to go up.  She is agreeable to try an ARB.  Last available BMP is from 08/2023 with normal electrolytes and kidney function.  We will repeat a BMP today. - Start losartan 25 mg daily and continue amlodipine  10 mg daily,  hydrochlorothiazide  25 mg daily, and spironolactone  12.5 mg daily - BMP today, if any abnormalities such as borderline hyperkalemia or worsening kidney function we will change this medication regimen Patellofemoral pain syndrome of left knee Patient reports several months of left anterior knee pain that is primarily infra patellar.  No inciting event or injury to the knee.  Pain is mostly present with flexion of the knee but has been hurting when she walks as well.  This has slowly worsened over the past several months.  On exam there is mild tenderness with lateral patellar glide and palpation of the patellar ligament but no surrounding edema, joint effusion, joint line tenderness, posterior knee tenderness, or joint instability.  Overall consistent with patellofemoral pain and we will treat conservatively. - Referral to physical therapy, topical NSAIDs, NSAIDs as needed, ice as needed - If no improvement at next visit, recommend getting an x-ray to evaluate for any occult fracture or other abnormality DM (diabetes mellitus), type 2 with complications (HCC) Fatigue, unspecified type Last A1c on 06/06/2024 was 6.6 which is down from 7.2 on 01/12/2024.  Urine microalbumin within normal limits on 12/15/2023.  Not due for foot exam or eye exam.  She continues on insulin  therapy with OmniPod, continuous glucose monitoring with Dexcom G7, and on GLP-1 therapy with Ozempic .  She was reporting 4-5 episodes of low blood sugar a week although her lowest blood sugar has been 69.  She states she feels some symptoms of low blood sugar when her blood glucose goes below 110.  Further discussion reveals that this is mostly tied to her inputting more carbohydrates than she actually eats before meals and she will work on doing this more accurately.  For her fatigue we will also check a TSH and CBC to ensure no occult anemia or thyroid abnormality. - Continue with OmniPod insulin  pump and Ozempic  2 mg weekly - Continue CGM  with Dexcom G7 - Return in 4 weeks to see diabetes coordinator and in 6-8 weeks for provider visit  Orders Placed This Encounter  Procedures   TSH   Basic metabolic panel with GFR   CBC with Differential/Platelet   Ambulatory referral to Physical Therapy    Referral Priority:   Routine    Referral Type:   Physical Medicine    Referral Reason:   Specialty Services Required    Requested Specialty:   Physical Therapy    Number of Visits Requested:   1     Return in about 4 weeks (around 08/26/2024) for 4 weeks with diabetic coordinator and 6-8 weeks with MD/DO.   Patient discussed with Dr. Ronnald Sergeant  Fairy Pool, DO [facility] Internal [facility] Resident PGY-3 Clinic Phone: [phone] Please contact the on call pager at [phone] for any urgent or emergent needs.

## 2024-07-29 NOTE — Patient Instructions (Addendum)
 Thank you, Ms.Natesha JINNY Roys, for allowing us  to provide your care today. Today we discussed . . .  > Hypertension       -Since her blood pressure is still just a little bit above our goal of less than 130/80.  I would like to start a medication called losartan at 25 mg daily.  Otherwise continue your other medications and continue to check your blood pressure at home.  We will check your kidney function and electrolytes today and if there is anything abnormal where we need to change her medications I will call you to discuss this. > Diabetes       -For your low blood sugar symptoms I would like to work on improving our carb counting to make sure we do not give too much insulin  for the meals that we eat.  I am going to send a message to Arland and she might call you if she would like to change any of the settings.  If you have any more lows or other problems with this please do not hesitate to call us .  Will plan to have you follow-up with Arland in about 4 weeks and then in the clinic again about 2 to 4 weeks after that. > Knee pain       - I think your knee pain is most likely due to something called patellofemoral pain syndrome which is a very common cause of knee pain and is treated with conservative measures.  I am going to send a referral to physical therapy to get you seen there.  I would like you to continue to exercise in any way that does not cause pain and you can use NSAIDs as needed.  I recommend using a topical NSAID like Voltaren  gel over the area and he can also use ibuprofen 200-400 mg every 4-6 hours to help pain especially if it allows you to do weightbearing exercises without significant pain afterwards.  You can also use ice over the area.   I have ordered the following labs for you:   Lab Orders         Basic metabolic panel with GFR         CBC with Differential/Platelet        Follow up: 4 weeks to see the diabetes coordinator and 6-8 weeks to see another  provider   Remember:  Should you have any questions or concerns please call the internal medicine clinic at [phone].     Fairy Pool, DO [facility]

## 2024-07-30 LAB — BASIC METABOLIC PANEL WITH GFR
BUN/Creatinine Ratio: 9 (ref 9–23)
BUN: 9 mg/dL (ref 6–20)
CO2: 21 mmol/L (ref 20–29)
Calcium: 9.7 mg/dL (ref 8.7–10.2)
Chloride: 103 mmol/L (ref 96–106)
Creatinine, Ser: 0.95 mg/dL (ref 0.57–1.00)
Glucose: 135 mg/dL — ABNORMAL HIGH (ref 70–99)
Potassium: 4 mmol/L (ref 3.5–5.2)
Sodium: 138 mmol/L (ref 134–144)
eGFR: 80 mL/min/1.73 (ref >59)

## 2024-07-30 LAB — CBC WITH DIFFERENTIAL/PLATELET
Basophils Absolute: 0 x10E3/uL (ref 0.0–0.2)
Basos: 1 %
EOS (ABSOLUTE): 0.1 x10E3/uL (ref 0.0–0.4)
Eos: 2 %
Hematocrit: 42 % (ref 34.0–46.6)
Hemoglobin: 13.7 g/dL (ref 11.1–15.9)
Immature Grans (Abs): 0 x10E3/uL (ref 0.0–0.1)
Immature Granulocytes: 0 %
Lymphocytes Absolute: 2.4 x10E3/uL (ref 0.7–3.1)
Lymphs: 45 %
MCH: 28.6 pg (ref 26.6–33.0)
MCHC: 32.6 g/dL (ref 31.5–35.7)
MCV: 88 fL (ref 79–97)
Monocytes Absolute: 0.5 x10E3/uL (ref 0.1–0.9)
Monocytes: 10 %
Neutrophils Absolute: 2.2 x10E3/uL (ref 1.4–7.0)
Neutrophils: 42 %
Platelets: 463 x10E3/uL — ABNORMAL HIGH (ref 150–450)
RBC: 4.79 x10E6/uL (ref 3.77–5.28)
RDW: 13.5 % (ref 11.7–15.4)
WBC: 5.3 x10E3/uL (ref 3.4–10.8)

## 2024-07-30 LAB — TSH: TSH: 3.02 u[IU]/mL (ref 0.450–4.500)

## 2024-07-31 ENCOUNTER — Encounter: Payer: Self-pay | Admitting: Student

## 2024-08-01 ENCOUNTER — Other Ambulatory Visit (HOSPITAL_COMMUNITY): Payer: Self-pay

## 2024-08-01 ENCOUNTER — Other Ambulatory Visit: Payer: Self-pay | Admitting: Student

## 2024-08-01 ENCOUNTER — Encounter (HOSPITAL_COMMUNITY): Payer: Self-pay

## 2024-08-01 DIAGNOSIS — E66811 Obesity, class 1: Secondary | ICD-10-CM

## 2024-08-01 DIAGNOSIS — E118 Type 2 diabetes mellitus with unspecified complications: Secondary | ICD-10-CM

## 2024-08-01 MED ORDER — TIRZEPATIDE 5 MG/0.5ML ~~LOC~~ SOAJ
5.0000 mg | SUBCUTANEOUS | 1 refills | Status: DC
  Filled 2024-08-01: qty 2, 28d supply, fill #0

## 2024-08-01 NOTE — Progress Notes (Signed)
 Internal Medicine Clinic Attending  Case discussed with the resident at the time of the visit.  We reviewed the resident's history and exam and pertinent patient test results.  I agree with the assessment, diagnosis, and plan of care documented in the resident's note.

## 2024-08-08 ENCOUNTER — Other Ambulatory Visit: Payer: Self-pay

## 2024-08-08 ENCOUNTER — Other Ambulatory Visit (HOSPITAL_COMMUNITY): Payer: Self-pay

## 2024-08-09 ENCOUNTER — Other Ambulatory Visit (HOSPITAL_COMMUNITY): Payer: Self-pay

## 2024-08-10 ENCOUNTER — Other Ambulatory Visit: Payer: Self-pay

## 2024-08-11 ENCOUNTER — Other Ambulatory Visit: Payer: Self-pay

## 2024-08-11 ENCOUNTER — Other Ambulatory Visit (HOSPITAL_COMMUNITY): Payer: Self-pay

## 2024-08-19 ENCOUNTER — Other Ambulatory Visit (HOSPITAL_COMMUNITY): Payer: Self-pay

## 2024-08-22 ENCOUNTER — Other Ambulatory Visit (HOSPITAL_COMMUNITY): Payer: Self-pay

## 2024-08-22 ENCOUNTER — Telehealth: Payer: Self-pay

## 2024-08-22 ENCOUNTER — Other Ambulatory Visit: Payer: Self-pay | Admitting: Student

## 2024-08-22 ENCOUNTER — Encounter: Payer: Self-pay | Admitting: Student

## 2024-08-22 MED ORDER — MOUNJARO 7.5 MG/0.5ML ~~LOC~~ SOAJ
7.5000 mg | SUBCUTANEOUS | 3 refills | Status: DC
  Filled 2024-08-22 – 2024-08-23 (×2): qty 2, 28d supply, fill #0
  Filled ????-??-??: fill #1

## 2024-08-22 NOTE — Telephone Encounter (Signed)
 Patient is no longer taking Ozempic . Patient is requesting the dispense quantity to be 90 instead of 28 as she is going out of town and will not be able to pick up her refills.

## 2024-08-22 NOTE — Telephone Encounter (Signed)
 Copied from CRM #[id number]. Topic: Clinical - Prescription Issue >> Aug 22, 2024 10:34 AM Mercer PEDLAR wrote: Reason for CRM: Patient is requesting 90 day supply of tirzepatide  (MOUNJARO ) 5 MG/0.5ML Pen to be sent to pharmacy.   [place] [facility] [address], [address], [place] [address] Phone: [phone]  Fax: [phone]

## 2024-08-22 NOTE — Telephone Encounter (Signed)
 Patient stated she is going out of town and is requesting that this gets taken care of urgently.

## 2024-08-23 ENCOUNTER — Other Ambulatory Visit: Payer: Self-pay | Admitting: Student

## 2024-08-23 ENCOUNTER — Telehealth: Payer: Self-pay | Admitting: *Deleted

## 2024-08-23 ENCOUNTER — Other Ambulatory Visit (HOSPITAL_COMMUNITY): Payer: Self-pay

## 2024-08-23 NOTE — Telephone Encounter (Unsigned)
 Copied from CRM #[id number]. Topic: Clinical - Prescription Issue >> Aug 22, 2024 10:34 AM Mercer PEDLAR wrote: Reason for CRM: Patient is requesting 90 day supply of tirzepatide  (MOUNJARO ) 5 MG/0.5ML Pen to be sent to pharmacy.   [place] [facility] [address], [address], [place] [address] Phone: [phone]  Fax: [phone] >> Aug 23, 2024  9:39 AM Zane F wrote:  Prescription In question: tirzepatide  (MOUNJARO ) 7.5 MG/0.5ML Pen   Patient is calling in to have the tirzepatide  (MOUNJARO ) 7.5 MG/0.5ML Pen  prescription resubmitted as a 90 day supply prescription instead of a 28 day supply prescription with refills due to going out of town and not having the ability to pick up the prescription locally on a repeated basis. Please resubmit the patient's tirzepatide  (MOUNJARO ) 7.5 MG/0.5ML Pen  prescription for a 90 day supply prescription and call the patient by end of business day to ensure it has been completed.   Preferred Pharmacy: [place] - [facility] [address], [place] [address] Phone: [phone]  Fax: [phone]   Callback Number: [phone]

## 2024-08-25 ENCOUNTER — Other Ambulatory Visit: Payer: Self-pay

## 2024-08-29 ENCOUNTER — Ambulatory Visit: Admitting: Dietician

## 2024-08-29 ENCOUNTER — Other Ambulatory Visit (HOSPITAL_COMMUNITY): Payer: Self-pay

## 2024-08-29 VITALS — Wt 234.8 lb

## 2024-08-29 DIAGNOSIS — E118 Type 2 diabetes mellitus with unspecified complications: Secondary | ICD-10-CM

## 2024-08-29 DIAGNOSIS — E119 Type 2 diabetes mellitus without complications: Secondary | ICD-10-CM | POA: Diagnosis not present

## 2024-08-29 NOTE — Patient Instructions (Addendum)
 A1c can be done on 09/05/24. You can make a doctor appointment to have that done anytime after that date.   Look at insulin  pumps on the [employer] website and compare- one you might like is- Tandem Mobi when updated to tubeless. (2026?)   You did great at carbohydrate counting.   Keep taking a bolus based on your meal/snacks total carb count and blood sugar about 15 minutes before your meals and snacks.   Also please continue to take a correction bolus when blood sugar is >/= 250 mg/dL.  This will help your pump's algorithm work better for you.   Let's follow up in 3 months or sooner if needed  Call anytime!  Arland [phone] .

## 2024-08-29 NOTE — Progress Notes (Signed)
 Raven Webb starting using insulin  pump therapy in 10/2023. Her A1c has dropped from 11.6% before this to 7.2% and 6.6% in September 2025.  Recommend change in pump settings to prevent hypoglycemia and promote patient desired weight loss: Insulin  to carb ration from 5.5 to 9.7 (less insulin )  Correction factor from 25 to 41 (less insulin )  Dr. Jolaine asked me to meet with [name] to review carb counting because he suspected her overestimation of carb counts was giving low blood sugar symptoms.  He said she had been having low blood sugar symptoms at lower normal blood sugars. She states she is still having some issues with this, but it happens mostly when she has not given herself a bolus. She states she had two episodes in the last week one her blood sugar was in the 70s and the other in the 60s both occurring midday. Her CGM report shows a low blood sugar midday of 55 mg/dl on 88/70.  She recently began a higher does of tirzepetide and is tolerating it. Her weight is stable.  Met with patient today and reviewed carb counting skills. She enjoyed the practice and did well on a whole. However, she needed minimal review of label reading and what foods to count as having carbs.    Lab Results  Component Value Date   HGBA1C 6.6 06/06/2024   HGBA1C 7.2 (A) 01/12/2024   HGBA1C 11.6 (A) 10/14/2023   HGBA1C 12.3 (A) 09/10/2023   HGBA1C 7.0 (A) 05/06/2023    Diabetes Self-Management Education  Visit Type: Annual Follow-Up  Appt. Start Time: 1015 Appt. End Time: 1100  08/29/2024  Raven Webb, identified by name and date of birth, is a 38 y.o. female with a diagnosis of Diabetes:  .   ASSESSMENT  Weight 234 lb 12.8 oz (106.5 kg). Body mass index is 36.77 kg/m. Wt Readings from Last 20 Encounters:  08/29/24 234 lb 12.8 oz (106.5 kg)  07/29/24 231 lb 6.4 oz (105 kg)  07/04/24 234 lb 14.4 oz (106.5 kg)  04/18/24 239 lb (108.4 kg)  03/10/24 241 lb 3.2 oz (109.4 kg)  02/11/24 239 lb 9.6 oz  (108.7 kg)  02/01/24 239 lb 4 oz (108.5 kg)  01/12/24 236 lb 14.4 oz (107.5 kg)  12/15/23 231 lb 11.2 oz (105.1 kg)  11/16/23 224 lb 8 oz (101.8 kg)  10/14/23 219 lb 12.8 oz (99.7 kg)  09/10/23 214 lb 12.8 oz (97.4 kg)  08/31/23 212 lb 1.6 oz (96.2 kg)  06/22/23 224 lb (101.6 kg)  06/08/23 228 lb 1.6 oz (103.5 kg)  05/06/23 232 lb 9.6 oz (105.5 kg)  03/06/23 226 lb 8 oz (102.7 kg)  02/26/23 228 lb 8 oz (103.6 kg)  01/19/23 228 lb 8 oz (103.6 kg)  01/07/23 228 lb 9.6 oz (103.7 kg)   Cgm x 14 day report shows:   CGM Results from download:   % Time CGM active:   88 %   (Goal >70%)  Average glucose:   158 mg/dL for 14 days  Glucose management indicator:   7.1 %  Time in range (70-180 mg/dL):   73 %   (Goal >29%)  Time High (181-250 mg/dL):   23 %   (Goal < 74%)  Time Very High (>250 mg/dL):    4 %   (Goal < 5%)  Time Low (54-69 mg/dL):   0 %   (Goal <5%)  Time Very Low (<54 mg/dL):   >1 %   (Goal <8%)  Coefficient of  variation:   26.8 %   (Goal <36%)      Diabetes Self-Management Education - 08/29/24 1300       Visit Information   Visit Type Annual Follow-Up      Health Coping   How would you rate your overall health? Good      Psychosocial Assessment   Patient Belief/Attitude about Diabetes Motivated to manage diabetes    What is the hardest part about your diabetes right now, causing you the most concern, or is the most worrisome to you about your diabetes?   Getting support / problem solving   getting replacement senosrs and pods when she has problems   Self-care barriers Lack of material resources    Self-management support Family    Patient Concerns Glycemic Control;Weight Control;Support    Special Needs None    Preferred Learning Style No preference indicated    Learning Readiness Ready    How often do you need to have someone help you when you read instructions, pamphlets, or other written materials from your doctor or pharmacy? 2 - Rarely    What is the last  grade level you completed in school? 12      Pre-Education Assessment   Patient understands incorporating nutritional management into lifestyle. Needs Review    Patient understands monitoring blood glucose, interpreting and using results Needs Review    Patient understands prevention, detection, and treatment of acute complications. Needs Review      Complications   Last HgB A1C per patient/outside source 6.6 %    How often do you check your blood sugar? --   continuously   Fasting Blood glucose range (mg/dL) 29-870;869-820    Postprandial Blood glucose range (mg/dL) 869-820;819-799;>799    Number of hypoglycemic episodes per month 4    Can you tell when your blood sugar is low? Yes    What do you do if your blood sugar is low? eats/drinks something    Number of hyperglycemic episodes ( >200mg /dL): Weekly    Have you had a dilated eye exam in the past 12 months? Yes    Have you had a dental exam in the past 12 months? Yes    Are you checking your feet? Yes    How many days per week are you checking your feet? 7      Dietary Intake   Breakfast deferred due to carb couting reveiw      Activity / Exercise   Activity / Exercise Type Light (walking / raking leaves);ADL's    How many days per week do you exercise? 3    How many minutes per day do you exercise? 50    Total minutes per week of exercise 150      Patient Education   Previous Diabetes Education Yes    Healthy Eating Carbohydrate counting;Reviewed blood glucose goals for pre and post meals and how to evaluate the patients' food intake on their blood glucose level.    Monitoring Taught/evaluated CGM (comment)    Acute complications Taught prevention, symptoms, and  treatment of hypoglycemia - the 15 rule.      Individualized Goals (developed by patient)   Nutrition Carb counting      Post-Education Assessment   Patient understands incorporating nutritional management into lifestyle. Comprehends key points    Patient  understands monitoring blood glucose, interpreting and using results Comprehends key points    Patient understands prevention, detection, and treatment of acute complications. Comprehends key points  Outcomes   Expected Outcomes Demonstrated interest in learning. Expect positive outcomes    Future DMSE 3-4 months    Program Status Not Completed   to complete at 3 months follow up to assess her retention of carb counting skills     Subsequent Visit   Since your last visit have you continued or begun to take your medications as prescribed? Yes   she states she likes insulin  pump therapy, but running out of pods and cgm sensors has been a problem. overpatches were provided today, alcohol wipes and adhesive remover. she states she still has some skin tac and it has helped.   Since your last visit have you had your blood pressure checked? No    Since your last visit have you experienced any weight changes? No change    Since your last visit, are you checking your blood glucose at least once a day? Yes   except when she has an error and cannot get a new sensor from Dexcom         Individualized Plan for Diabetes Self-Management Training:   Learning Objective:  Patient will have a greater understanding of diabetes self-management. Patient education plan is to attend individual and/or group sessions per assessed needs and concerns.   Plan:   Patient Instructions  A1c can be done on 09/05/24. You can make a doctor appointment to have that done anytime after that date.   Look at insulin  pumps on the [employer] website and compare- one you might like is- Tandem Mobi when updated to tubeless. (2026?)   You did great at carbohydrate counting.   Keep taking a bolus based on your meals total carb count about 15 minutes before your meal.   Also please continue to take a correction bolus when blood sugar is >/= 250 ,g/dL.  This will help your pump's algorithm work better for you.   Let's  follow up in 3 months or sooner if needed  Call anytime!  Arland [phone] .      Expected Outcomes:  Demonstrated interest in learning. Expect positive outcomes  Education material provided: Diabetes Resources  If problems or questions, patient to contact team via:  Phone and Email  Future DSME appointment: 3-4 months  Arland Hole, RD 08/29/2024 1:27 PM.

## 2024-08-30 ENCOUNTER — Ambulatory Visit: Admitting: Dietician

## 2024-08-31 DIAGNOSIS — E118 Type 2 diabetes mellitus with unspecified complications: Secondary | ICD-10-CM

## 2024-09-01 ENCOUNTER — Other Ambulatory Visit (HOSPITAL_COMMUNITY): Payer: Self-pay

## 2024-09-02 ENCOUNTER — Other Ambulatory Visit (HOSPITAL_COMMUNITY): Payer: Self-pay

## 2024-09-05 ENCOUNTER — Other Ambulatory Visit (HOSPITAL_COMMUNITY): Payer: Self-pay

## 2024-09-05 MED ORDER — MOUNJARO 7.5 MG/0.5ML ~~LOC~~ SOAJ
7.5000 mg | SUBCUTANEOUS | 1 refills | Status: AC
  Filled 2024-09-09 (×3): qty 6, 84d supply, fill #0
  Filled 2024-10-19 (×3): qty 6, 84d supply, fill #1

## 2024-09-06 ENCOUNTER — Other Ambulatory Visit (HOSPITAL_COMMUNITY): Payer: Self-pay

## 2024-09-06 NOTE — Addendum Note (Signed)
 Addended by: CAMIE ARLAND HERO on: 09/06/2024 01:31 PM   Modules accepted: Orders

## 2024-09-09 ENCOUNTER — Other Ambulatory Visit (HOSPITAL_COMMUNITY): Payer: Self-pay

## 2024-09-09 ENCOUNTER — Other Ambulatory Visit: Payer: Self-pay

## 2024-09-12 ENCOUNTER — Ambulatory Visit: Payer: Self-pay | Admitting: Student

## 2024-09-16 ENCOUNTER — Other Ambulatory Visit (HOSPITAL_COMMUNITY): Payer: Self-pay

## 2024-09-20 ENCOUNTER — Ambulatory Visit

## 2024-09-21 ENCOUNTER — Ambulatory Visit

## 2024-09-21 ENCOUNTER — Ambulatory Visit: Admitting: Obstetrics & Gynecology

## 2024-09-23 ENCOUNTER — Encounter: Payer: Self-pay | Admitting: Student

## 2024-09-23 ENCOUNTER — Other Ambulatory Visit (HOSPITAL_COMMUNITY): Payer: Self-pay

## 2024-09-24 ENCOUNTER — Other Ambulatory Visit: Payer: Self-pay | Admitting: Student

## 2024-09-24 ENCOUNTER — Other Ambulatory Visit (HOSPITAL_COMMUNITY): Payer: Self-pay

## 2024-09-24 DIAGNOSIS — E118 Type 2 diabetes mellitus with unspecified complications: Secondary | ICD-10-CM

## 2024-09-24 DIAGNOSIS — E119 Type 2 diabetes mellitus without complications: Secondary | ICD-10-CM

## 2024-09-26 ENCOUNTER — Other Ambulatory Visit: Payer: Self-pay | Admitting: Student

## 2024-09-26 ENCOUNTER — Other Ambulatory Visit: Payer: Self-pay | Admitting: Internal Medicine

## 2024-09-26 ENCOUNTER — Other Ambulatory Visit (HOSPITAL_COMMUNITY): Payer: Self-pay

## 2024-09-26 DIAGNOSIS — E119 Type 2 diabetes mellitus without complications: Secondary | ICD-10-CM

## 2024-09-26 DIAGNOSIS — E118 Type 2 diabetes mellitus with unspecified complications: Secondary | ICD-10-CM

## 2024-09-26 MED ORDER — DEXCOM G7 SENSOR MISC
11 refills | Status: AC
  Filled 2024-09-26 (×2): qty 3, 30d supply, fill #0
  Filled 2024-09-27: qty 1, 10d supply, fill #1
  Filled 2024-10-06: qty 1, 10d supply, fill #2
  Filled 2024-10-11: qty 3, 30d supply, fill #3

## 2024-09-26 MED ORDER — DEXCOM G6 TRANSMITTER MISC
3 refills | Status: DC
  Filled 2024-09-26: qty 1, 90d supply, fill #0

## 2024-09-26 NOTE — Telephone Encounter (Unsigned)
 Copied from CRM [id number]. Topic: Clinical - Medication Refill >> Sep 26, 2024  9:05 AM Susanna ORN wrote: Medication: Continuous Glucose Sensor (DEXCOM G7 SENSOR) MISC  Has the patient contacted their pharmacy? No (Agent: If no, request that the patient contact the pharmacy for the refill. If patient does not wish to contact the pharmacy document the reason why and proceed with request.) (Agent: If yes, when and what did the pharmacy advise?)  This is the patient's preferred pharmacy:  [place] - [facility] [address] [place] [address] Phone: [phone] Fax: [phone]  Is this the correct pharmacy for this prescription? Yes If no, delete pharmacy and type the correct one.   Has the prescription been filled recently? Yes  Is the patient out of the medication? Yes  Has the patient been seen for an appointment in the last year OR does the patient have an upcoming appointment? Yes  Can we respond through MyChart? Yes  Agent: Please be advised that Rx refills may take up to 3 business days. We ask that you follow-up with your pharmacy.

## 2024-09-26 NOTE — Telephone Encounter (Signed)
 Prescription for the Dexcom 7 Sensors #3 with 11 refills was called to the [facility] on [address] per ok of Dr. Jolaine.

## 2024-09-26 NOTE — Telephone Encounter (Signed)
 Medication sent to pharmacy

## 2024-09-27 ENCOUNTER — Other Ambulatory Visit: Payer: Self-pay

## 2024-09-27 ENCOUNTER — Encounter: Payer: Self-pay | Admitting: Student

## 2024-09-27 ENCOUNTER — Other Ambulatory Visit: Payer: Self-pay | Admitting: Dietician

## 2024-09-27 ENCOUNTER — Ambulatory Visit: Admitting: Student

## 2024-09-27 VITALS — BP 125/72 | HR 84 | Temp 98.0°F | Ht 67.0 in | Wt 239.6 lb

## 2024-09-27 DIAGNOSIS — E118 Type 2 diabetes mellitus with unspecified complications: Secondary | ICD-10-CM

## 2024-09-27 DIAGNOSIS — Z23 Encounter for immunization: Secondary | ICD-10-CM | POA: Diagnosis present

## 2024-09-27 DIAGNOSIS — Z7985 Long-term (current) use of injectable non-insulin antidiabetic drugs: Secondary | ICD-10-CM

## 2024-09-27 DIAGNOSIS — Z794 Long term (current) use of insulin: Secondary | ICD-10-CM | POA: Diagnosis not present

## 2024-09-27 DIAGNOSIS — I10 Essential (primary) hypertension: Secondary | ICD-10-CM

## 2024-09-27 DIAGNOSIS — R110 Nausea: Secondary | ICD-10-CM

## 2024-09-27 DIAGNOSIS — E119 Type 2 diabetes mellitus without complications: Secondary | ICD-10-CM

## 2024-09-27 LAB — POCT GLYCOSYLATED HEMOGLOBIN (HGB A1C): HbA1c, POC (controlled diabetic range): 6.8 % (ref 0.0–7.0)

## 2024-09-27 LAB — GLUCOSE, CAPILLARY: Glucose-Capillary: 136 mg/dL — ABNORMAL HIGH (ref 70–99)

## 2024-09-27 MED ORDER — DEXCOM G7 15 DAY SENSOR MISC
11 refills | Status: DC
  Filled 2024-09-27: qty 2, fill #0

## 2024-09-27 MED ORDER — ONDANSETRON HCL 4 MG PO TABS
4.0000 mg | ORAL_TABLET | Freq: Every day | ORAL | 1 refills | Status: AC | PRN
  Filled 2024-09-27: qty 14, 14d supply, fill #0

## 2024-09-27 MED ORDER — DEXCOM G7 15 DAY SENSOR MISC
11 refills | Status: AC
  Filled 2024-09-27: qty 2, fill #0
  Filled 2024-10-12 (×2): qty 1, 10d supply, fill #0
  Filled 2024-10-24: qty 1, 10d supply, fill #1

## 2024-09-27 NOTE — Progress Notes (Signed)
 "  CC: Routine Follow Up for management of chronic medical conditions after last office visit 07/29/2024  HPI:  Raven Webb is a 39 y.o. female with pertinent PMH of HTN, T2DM, and obesity who presents as above. Please see assessment and plan below for further details.  Medications: Current Outpatient Medications  Medication Instructions   Accu-Chek Softclix Lancets lancets Use to check blood sugar 3 times daily when you do not have a Continuous glucose monitoring and when CGM tells you to use your meter   amLODipine  (NORVASC ) 10 mg, Oral, Daily   Blood Glucose Monitoring Suppl (ACCU-CHEK GUIDE) w/Device KIT Use to check blood sugar 3 times daily   Continuous Glucose Receiver (DEXCOM G7 RECEIVER) DEVI Use as directed   Continuous Glucose Sensor (DEXCOM G7 15 DAY SENSOR) MISC Use to monitor blood sugar continuously, placing a new sensor every 10 days.   Continuous Glucose Sensor (DEXCOM G7 SENSOR) MISC Use to monitor blood sugar continuously, placing a new sensor every 10 days.   glucose blood (ACCU-CHEK GUIDE TEST) test strip Use to check blood sugar 3 times daily when you do not have a Continuous glucose monitoring and when CGM tells you to use your meter   insulin  aspart (NOVOLOG ) 100 UNIT/ML injection Use with omnipod to administer up to 90 units daily.   insulin  aspart (NOVOLOG ) 100 UNIT/ML injection Inject up to 90 Units into the skin daily. Use with omnipod.   Insulin  Disposable Pump (OMNIPOD 5 DEXG7G6 INTRO GEN 5) KIT Use to administer insulin  as instructed.   Insulin  Disposable Pump (OMNIPOD 5 DEXG7G6 PODS GEN 5) MISC Change pod every two days   Insulin  Pen Needle 32G X 4 MM MISC Use to inject insulin  at the same time each day   Insulin  Pen Needle 32G X 4 MM MISC Use to inject insulin  up to 4 times a day   losartan  (COZAAR ) 25 mg, Oral, Daily   medroxyPROGESTERone  (DEPO-PROVERA ) 150 mg, Every 3 months   Mounjaro  7.5 mg, Subcutaneous, Weekly   ondansetron  (ZOFRAN ) 4 mg, Oral, Daily  PRN     Review of Systems:   Pertinent items noted in HPI and/or A&P.  Physical Exam:  Vitals:   09/27/24 1431  BP: 125/72  Pulse: 84  Temp: 98 F (36.7 C)  TempSrc: Oral  SpO2: 100%  Weight: 239 lb 9.6 oz (108.7 kg)  Height: 5' 7 (1.702 m)    Constitutional: Well-appearing adult female. In no acute distress. HEENT: Normocephalic, atraumatic, Sclera non-icteric, PERRL, EOM intact Cardio:Regular rate and rhythm. 2+ bilateral radial pulses. Pulm:Clear to auscultation bilaterally. Normal work of breathing on room air. FDX:Wzhjupcz for extremity edema. Skin:Warm and dry. Neuro:Alert and oriented x3. No focal deficit noted. Psych:Pleasant mood and affect.   Assessment & Plan:   Assessment & Plan Type 2 diabetes mellitus without complication, without long-term current use of insulin  (HCC) DM (diabetes mellitus), type 2 with complications (HCC) A1c today stable at 6.8.  She continues with OmniPod, Dexcom CGM, and Mounjaro  7.5 mg weekly.  Due for urine microalbumin in 11/2024.  No reported hypoglycemic episodes.  Refused foot exam today, readdress at next visit. - Continue insulin  therapy with OmniPod insulin  pump and Mounjaro  7.5 mg weekly - Continue CGM with Dexcom G7 - Return in 3 months for repeat A1c Primary hypertension Blood pressure today well-controlled at 125/72.  She has been prescribed losartan , amlodipine , hydrochlorothiazide , and spironolactone .  She is actually only taking the amlodipine  and losartan  so we will remove the spironolactone  and  hydrochlorothiazide  today.  Last metabolic panel from 07/29/2024 showed stable renal function and electrolytes. - Continue losartan  25 mg daily and amlodipine  10 mg daily - BMP at next visit  Orders Placed This Encounter  Procedures   Flu vaccine trivalent PF, 6mos and older(Flulaval,Afluria,Fluarix,Fluzone)   Glucose, capillary     Return in about 3 months (around 12/26/2024) for T2DM, foot exam.   Patient discussed with  Dr. Mliss Foot  Fairy Pool, DO [facility] Resident PGY-3 Clinic Phone: [phone] Please contact the on call pager at [phone] for any urgent or emergent needs. "

## 2024-09-29 ENCOUNTER — Encounter: Payer: Self-pay | Admitting: Physician Assistant

## 2024-09-29 ENCOUNTER — Ambulatory Visit: Admitting: Physician Assistant

## 2024-09-29 ENCOUNTER — Other Ambulatory Visit (HOSPITAL_COMMUNITY)
Admission: RE | Admit: 2024-09-29 | Discharge: 2024-09-29 | Disposition: A | Discharge status: Home / Self Care | Source: Ambulatory Visit | Attending: Physician Assistant | Admitting: Physician Assistant

## 2024-09-29 ENCOUNTER — Ambulatory Visit

## 2024-09-29 ENCOUNTER — Other Ambulatory Visit: Payer: Self-pay

## 2024-09-29 VITALS — BP 140/101 | HR 78 | Ht 67.0 in | Wt 240.9 lb

## 2024-09-29 DIAGNOSIS — Z113 Encounter for screening for infections with a predominantly sexual mode of transmission: Secondary | ICD-10-CM

## 2024-09-29 DIAGNOSIS — Z124 Encounter for screening for malignant neoplasm of cervix: Secondary | ICD-10-CM | POA: Insufficient documentation

## 2024-09-29 DIAGNOSIS — Z01419 Encounter for gynecological examination (general) (routine) without abnormal findings: Secondary | ICD-10-CM | POA: Diagnosis not present

## 2024-09-29 DIAGNOSIS — I10 Essential (primary) hypertension: Secondary | ICD-10-CM

## 2024-09-29 DIAGNOSIS — Z3042 Encounter for surveillance of injectable contraceptive: Secondary | ICD-10-CM

## 2024-09-29 LAB — POCT PREGNANCY, URINE: Preg Test, Ur: NEGATIVE

## 2024-09-29 MED ORDER — MEDROXYPROGESTERONE ACETATE 150 MG/ML IM SUSP
150.0000 mg | Freq: Once | INTRAMUSCULAR | Status: AC
  Administered 2024-09-29: 150 mg via INTRAMUSCULAR

## 2024-09-29 NOTE — Progress Notes (Signed)
 Reisha J Couts here for Depo-Provera   Injection.  Injection administered without complication. Patient will return in 3 months between Mar 26-Apr 9 for next injection.  Sharlet GORMAN Mulch, CMA 09/29/2024  10:09 AM

## 2024-09-29 NOTE — Assessment & Plan Note (Signed)
 Blood pressure today well-controlled at 125/72.  She has been prescribed losartan , amlodipine , hydrochlorothiazide , and spironolactone .  She is actually only taking the amlodipine  and losartan  so we will remove the spironolactone  and hydrochlorothiazide  today.  Last metabolic panel from 07/29/2024 showed stable renal function and electrolytes. - Continue losartan  25 mg daily and amlodipine  10 mg daily - BMP at next visit

## 2024-09-29 NOTE — Assessment & Plan Note (Signed)
 A1c today stable at 6.8.  She continues with OmniPod, Dexcom CGM, and Mounjaro  7.5 mg weekly.  Due for urine microalbumin in 11/2024.  No reported hypoglycemic episodes.  Refused foot exam today, readdress at next visit. - Continue insulin  therapy with OmniPod insulin  pump and Mounjaro  7.5 mg weekly - Continue CGM with Dexcom G7 - Return in 3 months for repeat A1c

## 2024-09-29 NOTE — Progress Notes (Signed)
 "  ANNUAL EXAM Patient name: Raven Webb MRN [id number]  Date of birth: 1986-07-08 Chief Complaint:   Gynecologic Exam  History of Present Illness:   Raven Webb is a 39 y.o. [phone]  female being seen today for a routine annual exam.   Current complaints: Would like DMPA injection today  No LMP recorded. Patient has had an injection.  The pregnancy intention screening data noted above was reviewed. Potential methods of contraception were discussed. The patient elected to proceed with No data recorded.   Last pap 03/13/21. Results were: NILM w/ HRHPV negative. H/O abnormal pap: no Last mammogram: never done due to age. Results were: N/A. Family h/o breast cancer: mom had breast cancer, was diagnosed at age 64. Per uptodate guidelines, treat as average risk with screening at age 58.  Last colonoscopy: never done due to age. Results were: N/A. Family h/o colorectal cancer: no STI screening: Accepts Contraception: None     09/29/2024   10:23 AM 09/27/2024    2:34 PM 03/10/2024    9:56 AM 02/11/2024   10:36 AM 01/12/2024   10:22 AM  Depression screen PHQ 2/9  Decreased Interest 0 0 0 0 0  Down, Depressed, Hopeless 0 0 0 0 0  PHQ - 2 Score 0 0 0 0 0  Altered sleeping 0      Tired, decreased energy 0      Change in appetite 0      Feeling bad or failure about yourself  0      Trouble concentrating 0      Moving slowly or fidgety/restless 0      Suicidal thoughts 0      PHQ-9 Score 0      Difficult doing work/chores    Not difficult at all         09/29/2024   10:23 AM 07/17/2022    3:19 PM 02/07/2022   11:17 AM 09/02/2021    3:15 PM  GAD 7 : Generalized Anxiety Score  Nervous, Anxious, on Edge 0 0 0 0  Control/stop worrying 0 0 0 0  Worry too much - different things 0 0 0 0  Trouble relaxing 0 0 0 0  Restless 0 0 0 0  Easily annoyed or irritable 0 0 0 0  Afraid - awful might happen 0 0 0 0  Total GAD 7 Score 0 0 0 0     Review of Systems:   Pertinent items are noted in  HPI Denies any headaches, blurred vision, fatigue, shortness of breath, chest pain, abdominal pain, abnormal vaginal discharge/itching/odor/irritation, problems with periods, bowel movements, urination, or intercourse unless otherwise stated above. Pertinent History Reviewed:  Reviewed past medical,surgical, social and family history.  Reviewed problem list, medications and allergies. Physical Assessment:   Vitals:   09/29/24 0939  BP: (!) 142/99  Pulse: 71  Weight: 240 lb 14.4 oz (109.3 kg)  Height: 5' 7 (1.702 m)  Body mass index is 37.73 kg/m.        Physical Examination:   General appearance - well appearing, and in no distress  Mental status - alert, oriented to person, place, and time  Psych:  She has a normal mood and affect  Skin - warm and dry, normal color, no suspicious lesions noted  Chest - effort normal, all lung fields clear to auscultation bilaterally  Heart - normal rate and regular rhythm  Neck:  midline trachea, no thyromegaly or nodules  Breasts - breasts appear normal,  no suspicious masses, no skin or nipple changes or  axillary nodes  Abdomen - soft, nontender, nondistended, no masses or organomegaly. Insulin  pump device present.   Pelvic - VULVA: normal appearing vulva with no masses, tenderness or lesions  VAGINA: normal appearing vagina with normal color and discharge, no lesions  CERVIX: normal appearing cervix without discharge or lesions, no CMT  Thin prep pap is done with HR HPV cotesting  UTERUS: uterus is felt to be normal size, shape, consistency and nontender   ADNEXA: No adnexal masses or tenderness noted.  Extremities:  No swelling or varicosities noted  Chaperone present for exam  Results for orders placed or performed in visit on 09/29/24 (from the past 24 hours)  Pregnancy, urine POC   Collection Time: 09/29/24  9:47 AM  Result Value Ref Range   Preg Test, Ur NEGATIVE NEGATIVE    Assessment & Plan:   1. Encounter for annual routine  gynecological examination - Cervical cancer screening: Discussed guidelines. Pap with HPV updated today - GC/CT: accepts - Birth Control: Depo - Breast Health: Encouraged self breast awareness/SBE. Teaching provided.  - Mammogram: @ 37yo, or sooner if problems - Colonoscopy: @ 37yo, or sooner if problems - F/U 12 months and prn   2. Routine screening for STI (sexually transmitted infection)  3. Encounter for surveillance of injectable contraceptive - medroxyPROGESTERone  (DEPO-PROVERA ) injection 150 mg  4. Cervical cancer screening (Primary) - Cytology - PAP( [place])   5. Primary Hypertension BP 140/101 on losartan  and amlodipine  Saw PCP two days ago who reports well-controlled  Discussed normal at-home BP readings with patient. Precautions given.   Orders Placed This Encounter  Procedures   Pregnancy, urine POC    Meds:  Meds ordered this encounter  Medications   medroxyPROGESTERone  (DEPO-PROVERA ) injection 150 mg   Follow-up: No follow-ups on file.  Curley Fayette E Saga Balthazar, PA-C 09/29/2024 10:26 AM  "

## 2024-09-30 LAB — CERVICOVAGINAL ANCILLARY ONLY
Chlamydia: NEGATIVE
Comment: NEGATIVE
Comment: NEGATIVE
Comment: NORMAL
Neisseria Gonorrhea: NEGATIVE
Trichomonas: NEGATIVE

## 2024-10-03 LAB — CYTOLOGY - PAP
Comment: NEGATIVE
Diagnosis: NEGATIVE
Diagnosis: REACTIVE
High risk HPV: NEGATIVE

## 2024-10-03 NOTE — Progress Notes (Signed)
 Internal Medicine Clinic Attending  Case discussed with the resident at the time of the visit.  We reviewed the resident's history and exam and pertinent patient test results.  I agree with the assessment, diagnosis, and plan of care documented in the resident's note.

## 2024-10-04 ENCOUNTER — Ambulatory Visit: Payer: Self-pay | Admitting: Physician Assistant

## 2024-10-04 ENCOUNTER — Other Ambulatory Visit (HOSPITAL_COMMUNITY): Payer: Self-pay

## 2024-10-06 ENCOUNTER — Other Ambulatory Visit (HOSPITAL_COMMUNITY): Payer: Self-pay

## 2024-10-10 ENCOUNTER — Other Ambulatory Visit (HOSPITAL_COMMUNITY): Payer: Self-pay

## 2024-10-11 ENCOUNTER — Other Ambulatory Visit (HOSPITAL_COMMUNITY): Payer: Self-pay

## 2024-10-11 ENCOUNTER — Other Ambulatory Visit: Payer: Self-pay

## 2024-10-12 ENCOUNTER — Other Ambulatory Visit (HOSPITAL_COMMUNITY): Payer: Self-pay

## 2024-10-14 ENCOUNTER — Other Ambulatory Visit (HOSPITAL_COMMUNITY): Payer: Self-pay

## 2024-10-14 ENCOUNTER — Other Ambulatory Visit: Payer: Self-pay | Admitting: Student

## 2024-10-14 DIAGNOSIS — I10 Essential (primary) hypertension: Secondary | ICD-10-CM

## 2024-10-14 MED ORDER — AMLODIPINE BESYLATE 10 MG PO TABS
10.0000 mg | ORAL_TABLET | Freq: Every day | ORAL | 0 refills | Status: AC
  Filled 2024-10-14: qty 90, 90d supply, fill #0

## 2024-10-18 ENCOUNTER — Other Ambulatory Visit (HOSPITAL_COMMUNITY): Payer: Self-pay

## 2024-10-19 ENCOUNTER — Other Ambulatory Visit (HOSPITAL_COMMUNITY): Payer: Self-pay

## 2024-10-19 ENCOUNTER — Other Ambulatory Visit: Payer: Self-pay

## 2024-10-24 ENCOUNTER — Other Ambulatory Visit (HOSPITAL_COMMUNITY): Payer: Self-pay

## 2024-10-25 ENCOUNTER — Other Ambulatory Visit (HOSPITAL_COMMUNITY): Payer: Self-pay

## 2024-10-25 ENCOUNTER — Telehealth: Payer: Self-pay | Admitting: Dietician

## 2024-10-26 ENCOUNTER — Other Ambulatory Visit (HOSPITAL_COMMUNITY): Payer: Self-pay

## 2024-12-15 ENCOUNTER — Ambulatory Visit: Payer: Self-pay
